# Patient Record
Sex: Male | Born: 1940 | Race: White | Hispanic: No | Marital: Married | Smoking: Former smoker
Health system: Southern US, Community
[De-identification: ages and names within clinical notes are randomized; demographics above are authoritative.]

## PROBLEM LIST (undated history)

## (undated) DIAGNOSIS — G893 Neoplasm related pain (acute) (chronic): Secondary | ICD-10-CM

## (undated) DIAGNOSIS — I700 Atherosclerosis of aorta: Secondary | ICD-10-CM

## (undated) DIAGNOSIS — G4700 Insomnia, unspecified: Secondary | ICD-10-CM

## (undated) DIAGNOSIS — G2581 Restless legs syndrome: Secondary | ICD-10-CM

## (undated) DIAGNOSIS — I722 Aneurysm of renal artery: Secondary | ICD-10-CM

## (undated) DIAGNOSIS — I10 Essential (primary) hypertension: Secondary | ICD-10-CM

## (undated) DIAGNOSIS — C801 Malignant (primary) neoplasm, unspecified: Secondary | ICD-10-CM

## (undated) DIAGNOSIS — E860 Dehydration: Principal | ICD-10-CM

## (undated) DIAGNOSIS — N529 Male erectile dysfunction, unspecified: Secondary | ICD-10-CM

## (undated) DIAGNOSIS — I2510 Atherosclerotic heart disease of native coronary artery without angina pectoris: Secondary | ICD-10-CM

## (undated) DIAGNOSIS — M1990 Unspecified osteoarthritis, unspecified site: Secondary | ICD-10-CM

## (undated) DIAGNOSIS — Z7189 Other specified counseling: Secondary | ICD-10-CM

## (undated) DIAGNOSIS — J439 Emphysema, unspecified: Secondary | ICD-10-CM

## (undated) DIAGNOSIS — Z5111 Encounter for antineoplastic chemotherapy: Secondary | ICD-10-CM

## (undated) DIAGNOSIS — R0600 Dyspnea, unspecified: Secondary | ICD-10-CM

## (undated) DIAGNOSIS — D179 Benign lipomatous neoplasm, unspecified: Secondary | ICD-10-CM

## (undated) HISTORY — PX: CATARACT EXTRACTION: SUR2

## (undated) HISTORY — DX: Male erectile dysfunction, unspecified: N52.9

## (undated) HISTORY — DX: Atherosclerosis of aorta: I70.0

## (undated) HISTORY — DX: Atherosclerotic heart disease of native coronary artery without angina pectoris: I25.10

## (undated) HISTORY — DX: Essential (primary) hypertension: I10

## (undated) HISTORY — DX: Aneurysm of renal artery: I72.2

## (undated) HISTORY — DX: Unspecified osteoarthritis, unspecified site: M19.90

## (undated) HISTORY — DX: Emphysema, unspecified: J43.9

## (undated) HISTORY — DX: Benign lipomatous neoplasm, unspecified: D17.9

## (undated) HISTORY — DX: Restless legs syndrome: G25.81

## (undated) HISTORY — PX: PILONIDAL CYST EXCISION: SHX744

## (undated) HISTORY — PX: COLONOSCOPY: SHX174

## (undated) HISTORY — DX: Dehydration: E86.0

## (undated) HISTORY — DX: Other specified counseling: Z71.89

## (undated) HISTORY — DX: Insomnia, unspecified: G47.00

## (undated) HISTORY — DX: Neoplasm related pain (acute) (chronic): G89.3

## (undated) HISTORY — DX: Encounter for antineoplastic chemotherapy: Z51.11

---

## 2008-08-21 ENCOUNTER — Encounter: Admission: RE | Admit: 2008-08-21 | Discharge: 2008-08-21 | Payer: Self-pay | Admitting: Orthopedic Surgery

## 2008-09-04 ENCOUNTER — Encounter: Admission: RE | Admit: 2008-09-04 | Discharge: 2008-09-04 | Payer: Self-pay | Admitting: Orthopedic Surgery

## 2014-05-18 ENCOUNTER — Other Ambulatory Visit: Payer: Self-pay | Admitting: Family Medicine

## 2014-05-18 DIAGNOSIS — R9389 Abnormal findings on diagnostic imaging of other specified body structures: Secondary | ICD-10-CM

## 2014-05-23 ENCOUNTER — Ambulatory Visit
Admission: RE | Admit: 2014-05-23 | Discharge: 2014-05-23 | Disposition: A | Discharge status: Home / Self Care | Payer: Medicare Other | Source: Ambulatory Visit | Attending: Family Medicine | Admitting: Family Medicine

## 2014-05-23 DIAGNOSIS — R9389 Abnormal findings on diagnostic imaging of other specified body structures: Secondary | ICD-10-CM

## 2014-05-23 MED ORDER — IOHEXOL 300 MG/ML  SOLN
75.0000 mL | Freq: Once | INTRAMUSCULAR | Status: AC | PRN
  Administered 2014-05-23: 75 mL via INTRAVENOUS

## 2014-10-31 ENCOUNTER — Other Ambulatory Visit: Payer: Self-pay | Admitting: Family Medicine

## 2014-10-31 DIAGNOSIS — R918 Other nonspecific abnormal finding of lung field: Secondary | ICD-10-CM

## 2014-11-29 ENCOUNTER — Ambulatory Visit
Admission: RE | Admit: 2014-11-29 | Discharge: 2014-11-29 | Disposition: A | Discharge status: Home / Self Care | Payer: Medicare Other | Source: Ambulatory Visit | Attending: Family Medicine | Admitting: Family Medicine

## 2014-11-29 DIAGNOSIS — I700 Atherosclerosis of aorta: Secondary | ICD-10-CM

## 2014-11-29 DIAGNOSIS — I2510 Atherosclerotic heart disease of native coronary artery without angina pectoris: Secondary | ICD-10-CM

## 2014-11-29 DIAGNOSIS — I722 Aneurysm of renal artery: Secondary | ICD-10-CM

## 2014-11-29 DIAGNOSIS — R918 Other nonspecific abnormal finding of lung field: Secondary | ICD-10-CM

## 2014-11-29 HISTORY — DX: Atherosclerotic heart disease of native coronary artery without angina pectoris: I25.10

## 2014-11-29 HISTORY — DX: Atherosclerosis of aorta: I70.0

## 2014-11-29 HISTORY — DX: Aneurysm of renal artery: I72.2

## 2014-11-29 MED ORDER — IOPAMIDOL (ISOVUE-300) INJECTION 61%
75.0000 mL | Freq: Once | INTRAVENOUS | Status: AC | PRN
Start: 2014-11-29 — End: 2014-11-29
  Administered 2014-11-29: 75 mL via INTRAVENOUS

## 2014-11-30 ENCOUNTER — Other Ambulatory Visit (HOSPITAL_COMMUNITY): Payer: Self-pay | Admitting: Family Medicine

## 2014-11-30 DIAGNOSIS — R911 Solitary pulmonary nodule: Secondary | ICD-10-CM

## 2014-12-06 ENCOUNTER — Ambulatory Visit (HOSPITAL_COMMUNITY)
Admission: RE | Admit: 2014-12-06 | Discharge: 2014-12-06 | Disposition: A | Discharge status: Home / Self Care | Payer: Medicare Other | Source: Ambulatory Visit | Attending: Family Medicine | Admitting: Family Medicine

## 2014-12-06 DIAGNOSIS — R911 Solitary pulmonary nodule: Secondary | ICD-10-CM | POA: Insufficient documentation

## 2014-12-06 DIAGNOSIS — J984 Other disorders of lung: Secondary | ICD-10-CM | POA: Diagnosis present

## 2014-12-06 DIAGNOSIS — J439 Emphysema, unspecified: Secondary | ICD-10-CM | POA: Diagnosis not present

## 2014-12-06 DIAGNOSIS — R918 Other nonspecific abnormal finding of lung field: Secondary | ICD-10-CM | POA: Diagnosis not present

## 2014-12-06 LAB — GLUCOSE, CAPILLARY: Glucose-Capillary: 108 mg/dL — ABNORMAL HIGH (ref 65–99)

## 2014-12-06 MED ORDER — FLUDEOXYGLUCOSE F - 18 (FDG) INJECTION
8.4800 | Freq: Once | INTRAVENOUS | Status: AC | PRN
  Administered 2014-12-06: 8.48 via INTRAVENOUS

## 2014-12-19 ENCOUNTER — Other Ambulatory Visit: Payer: Self-pay

## 2014-12-19 DIAGNOSIS — R918 Other nonspecific abnormal finding of lung field: Secondary | ICD-10-CM | POA: Insufficient documentation

## 2014-12-19 DIAGNOSIS — R9389 Abnormal findings on diagnostic imaging of other specified body structures: Secondary | ICD-10-CM | POA: Insufficient documentation

## 2014-12-20 ENCOUNTER — Other Ambulatory Visit: Payer: Self-pay | Admitting: *Deleted

## 2014-12-20 ENCOUNTER — Institutional Professional Consult (permissible substitution) (INDEPENDENT_AMBULATORY_CARE_PROVIDER_SITE_OTHER): Payer: Medicare Other | Admitting: Cardiothoracic Surgery

## 2014-12-20 ENCOUNTER — Encounter: Payer: Self-pay | Admitting: Cardiothoracic Surgery

## 2014-12-20 VITALS — BP 155/87 | HR 88 | Resp 16 | Ht 71.0 in | Wt 170.0 lb

## 2014-12-20 DIAGNOSIS — G2581 Restless legs syndrome: Secondary | ICD-10-CM | POA: Insufficient documentation

## 2014-12-20 DIAGNOSIS — Z125 Encounter for screening for malignant neoplasm of prostate: Secondary | ICD-10-CM | POA: Insufficient documentation

## 2014-12-20 DIAGNOSIS — M159 Polyosteoarthritis, unspecified: Secondary | ICD-10-CM | POA: Insufficient documentation

## 2014-12-20 DIAGNOSIS — N529 Male erectile dysfunction, unspecified: Secondary | ICD-10-CM | POA: Insufficient documentation

## 2014-12-20 DIAGNOSIS — R911 Solitary pulmonary nodule: Secondary | ICD-10-CM

## 2014-12-20 DIAGNOSIS — D381 Neoplasm of uncertain behavior of trachea, bronchus and lung: Secondary | ICD-10-CM | POA: Diagnosis not present

## 2014-12-20 DIAGNOSIS — D179 Benign lipomatous neoplasm, unspecified: Secondary | ICD-10-CM | POA: Insufficient documentation

## 2014-12-20 DIAGNOSIS — H6060 Unspecified chronic otitis externa, unspecified ear: Secondary | ICD-10-CM | POA: Insufficient documentation

## 2014-12-20 DIAGNOSIS — J439 Emphysema, unspecified: Secondary | ICD-10-CM | POA: Insufficient documentation

## 2014-12-20 NOTE — Progress Notes (Signed)
[address]       [place] [address]             [phone]                    Wilbern Fatima Melmore Medical Record #[id number] Date of Birth: [birth year]  Referring: [name], MD Primary Care: [name], MD  Chief Complaint:    Chief Complaint  Patient presents with  . Lung Lesion    RULobe cluster per CT CHEST 11/29/14, PET 12/06/14    History of Present Illness:    Donald Delacruz 74 y.o. male is seen in the office  today for  Right upper lung lobe nodules. Patient had routine chest xray which was abnormal  Followed by ct of chest in Nov 2015. Follow up ct done in early June 2016 and patient referred to Thoracic surgery for evaluation. Patient is previous smoker for 16 years stopped in 1999.  He has not had any occupation exposure to asbestosis, working primarily in office environment. He denies any previous cardiac history.      Current Activity/ Functional Status:  Patient is independent with mobility/ambulation, transfers, ADL's, IADL's.   Zubrod Score: At the time of surgery this patient's most appropriate activity status/level should be described as: '[x]'$     0    Normal activity, no symptoms '[]'$     1    Restricted in physical strenuous activity but ambulatory, able to do out light work '[]'$     2    Ambulatory and capable of self care, unable to do work activities, up and about               >50 % of waking hours                              '[]'$     3    Only limited self care, in bed greater than 50% of waking hours '[]'$     4    Completely disabled, no self care, confined to bed or chair '[]'$     5    Moribund   Past Medical History  Diagnosis Date  . Hypertension   . ED (erectile dysfunction)   . Insomnia   . Lipoma   . Osteoarthritis   . Emphysema lung   . Restless legs syndrome     Past Surgical History  Procedure Laterality Date  . Cataract extraction      Family History  Problem Relation Age of Onset  . Lung cancer  Mother     History   Social History  . Marital Status: Married    Spouse Name: N/A  . Number of Children: N/A  . Years of Education: N/A   Occupational History  . Office work   Social History Main Topics  . Smoking status: Former Smoker -- 1.00 packs/day for 39 years    Types: Cigarettes    Quit date: 06/30/1997  . Smokeless tobacco: Never Used     Comment: SMOKED PIPES ALSO  . Alcohol Use: Not on file  . Drug Use: Not on file  . Sexual Activity: Not on file      History  Smoking status  . Former Smoker -- 1.00 packs/day for 39 years  . Types: Cigarettes  . Quit date: 06/30/1997  Smokeless tobacco  . Never Used    Comment: SMOKED PIPES ALSO  History  Alcohol Use: Not on file     Allergies  Allergen Reactions  . Amlodipine Swelling  . Benicar [Olmesartan]     Dizziness,malaise   . Nisoldipine Swelling    Current Outpatient Prescriptions  Medication Sig Dispense Refill  . aspirin EC 81 MG tablet Take 81 mg by mouth daily.    . Calcium Carbonate-Vitamin D (CALCIUM 600+D) 600-400 MG-UNIT per tablet Take 1 tablet by mouth daily.     . clobetasol ointment (TEMOVATE) 0.05 % Clobetasol Propionate 0.05 % External Ointment Apply to rash, and rub in well, twice a day as needed.  Quantity: 1;  Refills: 2   Wilson M.D., Jama Flavors ;  Start 24-Apr-2010 Active 30 GM Tube    . diltiazem (CARDIZEM CD) 180 MG 24 hr capsule Take by mouth daily.     [name] gabapentin (NEURONTIN) 300 MG capsule Take 300-600 mg by mouth at bedtime. TO RELIEVE RESTLESS LEG SYNDROME    . lisinopril (PRINIVIL,ZESTRIL) 20 MG tablet Take by mouth daily.     . meloxicam (MOBIC) 15 MG tablet Take 15 mg by mouth daily.    . metoprolol succinate (TOPROL-XL) 100 MG 24 hr tablet Take by mouth daily.     . Multiple Vitamin (MULTIVITAMIN) capsule Take by mouth daily.     . Omega-3 Fatty Acids (FISH OIL) 1000 MG CAPS Take 1,000 mg by mouth daily.     . sildenafil (VIAGRA) 100 MG tablet Take by mouth.    .  tadalafil (CIALIS) 5 MG tablet Take by mouth.    . Turmeric 450 MG CAPS Take 500 mg by mouth daily.     . vardenafil (LEVITRA) 20 MG tablet Take by mouth.     No current facility-administered medications for this visit.      Review of Systems:     Cardiac Review of Systems: Y or N  Chest Pain [ N   ]  Resting SOB [ N  ] Exertional SOB  [N  ]  Orthopnea Aqua.Slicker  ]   Pedal Edema [  N ]    Palpitations [ N ] Syncope  Aqua.Slicker  ]   Presyncope [ N  ]  General Review of Systems: [Y] = yes [  ]=no Constitional: recent weight change [ N ];  Wt loss over the last 3 months [   ] anorexia [  ]; fatigue Aqua.Slicker  ]; nausea [  ]; night sweats [  ]; fever Aqua.Slicker  ]; or chills [ N ];          Dental: poor dentition[N  ]; Last Dentist visit:   Eye : blurred vision [ N ]; diplopia [   ]; vision changes [  ];  Amaurosis fugax[N  ]; Resp: cough [ N ];  wheezing[ N ];  hemoptysis[ N ]; shortness of breath[  N]; paroxysmal nocturnal dyspnea[ N ]; dyspnea on exertion[ N ]; or orthopnea[  ];  GI:  gallstones[  ], vomiting[  ];  dysphagia[  ]; melena[  ];  hematochezia [  ]; heartburn[  ];   Hx of  Colonoscopy[  ]; GU: kidney stones [  ]; hematuria[  ];   dysuria [  ];  nocturia[  ];  history of     obstruction [  ]; urinary frequency [  ]             Skin: rash, swelling[  ];, hair loss[  ];  peripheral edema[  ];  or itching[  ];  Musculosketetal: myalgias[  ];  joint swelling[  ];  joint erythema[  ];  joint pain[Y  ];  back pain[ N ];  Heme/Lymph: bruising[ Y ];  bleeding[  ];  anemia[  ];  Neuro: TIA[  ];  headaches[ N ];  stroke[ N ];  vertigo[ N ];  seizures[N  ];   paresthesias[ N ];  difficulty walking[N  ];  Psych:depression[  ]; anxiety[  ];  Endocrine: diabetes[  ];  thyroid dysfunction[  ];  Immunizations: Flu up to date [ Y ]; Pneumococcal up to date [ Y ];  Other:  Physical Exam: BP 155/87 mmHg  Pulse 88  Resp 16  Ht '5\' 11"'$  (1.803 m)  Wt 170 lb (77.111 kg)  BMI 23.72 kg/m2  SpO2 98%  PHYSICAL  EXAMINATION: General appearance: alert, cooperative, appears stated age and no distress Head: Normocephalic, without obvious abnormality, atraumatic Neck: no adenopathy, no carotid bruit, no JVD, supple, symmetrical, trachea midline and thyroid not enlarged, symmetric, no tenderness/mass/nodules Lymph nodes: Cervical, supraclavicular, and axillary nodes normal. Resp: clear to auscultation bilaterally Back: symmetric, no curvature. ROM normal. No CVA tenderness. Cardio: regular rate and rhythm, S1, S2 normal, no murmur, click, rub or gallop GI: soft, non-tender; bowel sounds normal; no masses,  no organomegaly Extremities: extremities normal, atraumatic, no cyanosis or edema, Homans sign is negative, no sign of DVT and no edema, redness or tenderness in the calves or thighs Neurologic: Grossly normal  Palpable dt and pt pulses bilaterial    Diagnostic Studies & Laboratory data:     Recent Radiology Findings:   Ct Chest W Contrast  11/29/2014   CLINICAL DATA:  Pulmonary nodules.  EXAM: CT CHEST WITH CONTRAST  TECHNIQUE: Multidetector CT imaging of the chest was performed during intravenous contrast administration.  CONTRAST:  60m ISOVUE-300 IOPAMIDOL (ISOVUE-300) INJECTION 61%  COMPARISON:  CT scan of May 23, 2014.  FINDINGS: No pneumothorax or pleural effusion is noted. Mild emphysematous bulla are noted throughout both lungs. Stable 9 mm subpleural nodule is noted laterally in right lower lobe. The cluster of nodules and ground-glass opacities in the right upper lobe noted on prior exam now measure 24 x 21 mm which is significantly increased in size compared to prior exam. The more inferior and anterior nodule also is significantly increased in size, measuring 14 x 9 mm currently. No mediastinal mass or adenopathy is noted. Coronary artery calcifications are noted. Atherosclerosis of thoracic aorta is noted without aneurysm or dissection. 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 No significant osseous abnormality is noted.  IMPRESSION: Stable mild emphysematous changes noted throughout both lungs.  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  Stable 9 mm subpleural nodule is noted laterally in right lower lobe.  Cluster of nodules and ground-glass opacity seen in right upper lobe on prior exam has significantly enlarged in size, and PET scan is recommended to evaluate for possible neoplasm or malignancy.  These results will be called to the ordering clinician or representative by the Radiologist Assistant, and communication documented in the PACS or zVision Dashboard.   Electronically Signed   By: JMarijo Conception M.D.   On: 11/29/2014 12:17   Nm Pet Image Initial (pi) Skull Base To Thigh  12/06/2014   CLINICAL DATA:  Initial treatment strategy for enlarging right upper lobe pulmonary lesion.  EXAM: NUCLEAR MEDICINE PET SKULL BASE TO THIGH  TECHNIQUE: 8.5 mCi F-18 FDG was injected intravenously. Full-ring  PET imaging was performed from the skull base to thigh after the radiotracer. CT data was obtained and used for attenuation correction and anatomic localization.  FASTING BLOOD GLUCOSE:  Value: 108 mg/dl  COMPARISON:  Chest CTs of 05/23/2014 and 11/29/2014  FINDINGS: NECK  No areas of abnormal hypermetabolism.  CHEST  Spiculated right apical solid and sub solid pulmonary nodule is hypermetabolic. The dominant area measures 2.8 x 2.3 cm and a S.U.V. max of 5.8 on image 15 of series 8. Contiguous or satellite nodules inferiorly and anteriorly measure up to 1.0 cm and a S.U.V. max of 3.5.  No mediastinal or hilar nodal hypermetabolism.  ABDOMEN/PELVIS  No areas of abnormal hypermetabolism.  SKELETON  No abnormal marrow activity.  CT IMAGES PERFORMED FOR ATTENUATION CORRECTION  Advanced carotid atherosclerosis bilaterally. No cervical adenopathy.  Chest findings deferred to recent diagnostic CT. Mild cardiomegaly. Multivessel  coronary artery atherosclerosis. Subpleural right lower lobe 6 mm pulmonary nodule is below the resolution of PET. Centrilobular emphysema. Bilateral low-density renal lesions which are likely cysts.  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 Normal adrenal glands. Mild prostatomegaly. Small right larger than left fat containing inguinal hernias.  IMPRESSION: 1. 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 2. No evidence of thoracic nodal or extrathoracic hypermetabolic metastasis. 3. Mild cardiomegaly. Atherosclerosis, including within the coronary arteries. 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 4. Centrilobular emphysema. A right lower lobe pulmonary nodule is below the resolution of PET, but warrants followup attention.   Electronically Signed   By: Abigail Miyamoto M.D.   On: 12/06/2014 11:59     I have independently reviewed the above radiology studies  and reviewed the findings with the patient.   Recent Lab Findings: No results found for: WBC, HGB, HCT, PLT, GLUCOSE, CHOL, TRIG, HDL, LDLDIRECT, LDLCALC, ALT, AST, NA, K, CL, CREATININE, BUN, CO2, TSH, INR, GLUF, HGBA1C    Assessment / Plan:   1)  Spiculated right apical solid and sub solid pulmonary nodule is hypermetabolic.  measures 2.8 x 2.3 cm with  Contiguous or satellite nodules inferiorly and anteriorly measure up to 1.0 cm and a S.U.V. max of 3.5 suspicious for primary lung      carcinoma - possible clinicial stage IIb t3N0 (seperate nodules in the same lobe) 2)   No mediastinal or hilar nodal hypermetabolism.  3)    Stable 9 mm subpleural nodule is noted laterally in right lower lobe. 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  Patient prefers to proceed with bx first. Will arrange CT directed needle biopsy of the right upper lobe lung nodule. Will obtain PFT to asses degree of pulmonary disease. Patient will return after needle biopsy. Will need cardiac  clearance before surgical procedure.    I  spent 45  minutes counseling the patient face to face and 50% or more the  time was spent in counseling and coordination of care. The total time spent in the appointment was 60 minutes.  [name] MD      [address] [place] [address] Office [phone]   Beeper [phone]  12/20/2014 12:08 PM

## 2014-12-25 ENCOUNTER — Ambulatory Visit (HOSPITAL_COMMUNITY)
Admission: RE | Admit: 2014-12-25 | Discharge: 2014-12-25 | Disposition: A | Discharge status: Home / Self Care | Payer: Medicare Other | Source: Ambulatory Visit | Attending: Cardiothoracic Surgery | Admitting: Cardiothoracic Surgery

## 2014-12-25 DIAGNOSIS — R911 Solitary pulmonary nodule: Secondary | ICD-10-CM | POA: Diagnosis present

## 2014-12-25 LAB — PULMONARY FUNCTION TEST
DL/VA % pred: 64 %
DL/VA: 2.9 ml/min/mmHg/L
DLCO unc % pred: 62 %
DLCO unc: 19.24 ml/min/mmHg
FEF 25-75 Post: 1.59 L/sec
FEF 25-75 Pre: 1.34 L/sec
FEF2575-%Change-Post: 18 %
FEF2575-%Pred-Post: 73 %
FEF2575-%Pred-Pre: 62 %
FEV1-%Change-Post: 4 %
FEV1-%Pred-Post: 89 %
FEV1-%Pred-Pre: 86 %
FEV1-Post: 2.66 L
FEV1-Pre: 2.55 L
FEV1FVC-%Change-Post: 0 %
FEV1FVC-%Pred-Pre: 88 %
FEV6-%Change-Post: 4 %
FEV6-%Pred-Post: 102 %
FEV6-%Pred-Pre: 97 %
FEV6-Post: 3.92 L
FEV6-Pre: 3.75 L
FEV6FVC-%Change-Post: 0 %
FEV6FVC-%Pred-Post: 100 %
FEV6FVC-%Pred-Pre: 101 %
FVC-%Change-Post: 4 %
FVC-%Pred-Post: 101 %
FVC-%Pred-Pre: 96 %
FVC-Post: 4.14 L
FVC-Pre: 3.95 L
Post FEV1/FVC ratio: 64 %
Post FEV6/FVC ratio: 95 %
Pre FEV1/FVC ratio: 64 %
Pre FEV6/FVC Ratio: 95 %
RV % pred: 128 %
RV: 3.21 L
TLC % pred: 108 %
TLC: 7.39 L

## 2014-12-25 MED ORDER — ALBUTEROL SULFATE (2.5 MG/3ML) 0.083% IN NEBU
2.5000 mg | INHALATION_SOLUTION | Freq: Once | RESPIRATORY_TRACT | Status: AC
  Administered 2014-12-25: 2.5 mg via RESPIRATORY_TRACT

## 2014-12-26 ENCOUNTER — Other Ambulatory Visit: Payer: Self-pay | Admitting: Physician Assistant

## 2014-12-27 ENCOUNTER — Other Ambulatory Visit: Payer: Self-pay | Admitting: Radiology

## 2014-12-28 ENCOUNTER — Ambulatory Visit (HOSPITAL_COMMUNITY)
Admission: RE | Admit: 2014-12-28 | Discharge: 2014-12-28 | Disposition: A | Discharge status: Home / Self Care | Payer: Medicare Other | Source: Ambulatory Visit | Attending: Radiology | Admitting: Radiology

## 2014-12-28 ENCOUNTER — Ambulatory Visit (HOSPITAL_COMMUNITY)
Admission: RE | Admit: 2014-12-28 | Discharge: 2014-12-28 | Disposition: A | Discharge status: Home / Self Care | Payer: Medicare Other | Source: Ambulatory Visit | Attending: Diagnostic Radiology | Admitting: Diagnostic Radiology

## 2014-12-28 ENCOUNTER — Other Ambulatory Visit: Payer: Self-pay | Admitting: Radiology

## 2014-12-28 ENCOUNTER — Encounter (HOSPITAL_COMMUNITY): Payer: Self-pay

## 2014-12-28 ENCOUNTER — Encounter (HOSPITAL_COMMUNITY): Payer: Medicare Other

## 2014-12-28 ENCOUNTER — Ambulatory Visit (HOSPITAL_COMMUNITY)
Admission: RE | Admit: 2014-12-28 | Discharge: 2014-12-28 | Disposition: A | Discharge status: Home / Self Care | Payer: Medicare Other | Source: Ambulatory Visit | Attending: Cardiothoracic Surgery | Admitting: Cardiothoracic Surgery

## 2014-12-28 DIAGNOSIS — R911 Solitary pulmonary nodule: Secondary | ICD-10-CM | POA: Diagnosis present

## 2014-12-28 DIAGNOSIS — J95811 Postprocedural pneumothorax: Secondary | ICD-10-CM

## 2014-12-28 DIAGNOSIS — J939 Pneumothorax, unspecified: Secondary | ICD-10-CM | POA: Insufficient documentation

## 2014-12-28 DIAGNOSIS — C3411 Malignant neoplasm of upper lobe, right bronchus or lung: Secondary | ICD-10-CM | POA: Insufficient documentation

## 2014-12-28 LAB — APTT: APTT: 25 s (ref 24–37)

## 2014-12-28 LAB — CBC
HEMATOCRIT: 39.6 % (ref 39.0–52.0)
Hemoglobin: 13.4 g/dL (ref 13.0–17.0)
MCH: 32.6 pg (ref 26.0–34.0)
MCHC: 33.8 g/dL (ref 30.0–36.0)
MCV: 96.4 fL (ref 78.0–100.0)
PLATELETS: 198 10*3/uL (ref 150–400)
RBC: 4.11 MIL/uL — ABNORMAL LOW (ref 4.22–5.81)
RDW: 12.8 % (ref 11.5–15.5)
WBC: 5.3 10*3/uL (ref 4.0–10.5)

## 2014-12-28 LAB — PROTIME-INR
INR: 0.88 (ref 0.00–1.49)
Prothrombin Time: 12.2 seconds (ref 11.6–15.2)

## 2014-12-28 MED ORDER — MORPHINE SULFATE 2 MG/ML IJ SOLN: 1.0000 mg | INTRAMUSCULAR | Status: DC | PRN

## 2014-12-28 MED ORDER — MIDAZOLAM HCL 2 MG/2ML IJ SOLN
INTRAMUSCULAR | Status: DC
Start: 2014-12-28 — End: 2014-12-29
  Filled 2014-12-28: qty 2

## 2014-12-28 MED ORDER — MIDAZOLAM HCL 2 MG/2ML IJ SOLN
INTRAMUSCULAR | Status: AC | PRN
  Administered 2014-12-28: 0.5 mg via INTRAVENOUS
  Administered 2014-12-28: 1 mg via INTRAVENOUS

## 2014-12-28 MED ORDER — SODIUM CHLORIDE 0.9 % IV SOLN
INTRAVENOUS | Status: DC
  Administered 2014-12-28: 12:00:00 via INTRAVENOUS

## 2014-12-28 MED ORDER — FENTANYL CITRATE (PF) 100 MCG/2ML IJ SOLN
INTRAMUSCULAR | Status: AC | PRN
  Administered 2014-12-28: 25 ug via INTRAVENOUS
  Administered 2014-12-28: 50 ug via INTRAVENOUS

## 2014-12-28 MED ORDER — FENTANYL CITRATE (PF) 100 MCG/2ML IJ SOLN
INTRAMUSCULAR | Status: AC
  Filled 2014-12-28: qty 2

## 2014-12-28 NOTE — Procedures (Signed)
Post-Procedure Note  Pre-operative Diagnosis: Right lung nodules       Post-operative Diagnosis: Right lung nodules   Indications: Needs tissue diagnosis  Procedure Details:   CT guided core biopsy obtained of right upper lobe lesion.  Patient had coughing immediately following the biopsy and 5-10 ml of hemoptysis.  Patient placed on right side for approximately 30 minutes and coughing stopped. O2 sats remained above 90 with nasal cannula.  Follow up CT images showed a small pneumothorax.  Attempted to aspirate the pleural air but too small to safely place catheter from previous biopsy site.  Findings: RUL nodules.  Needle positioned in largest lesion.  Single core sample obtained and small amount of parenchymal hemorrhage.  Small pneumothorax.  Complications: Small pneumothorax.  Will get follow up CXRs.  Self-limiting mild hemoptysis.      Condition: stable  Plan: Return to Short Stay after CXR.  Likely get 2nd CXR in 2 hours.

## 2014-12-28 NOTE — Progress Notes (Signed)
Patient ID: Donald Delacruz, male   DOB: 06-10-1941, 74 y.o.   MRN: [id number]    Referring Physician(s): Gerhardt,Edward B  Subjective:  Pt without new changes; has had some intermittent coughing but no further hemoptysis; denies worsening dyspnea; some mild tenderness at bx site rt lat chest  Allergies: Amlodipine; Benicar; and Nisoldipine  Medications: Prior to Admission medications   Medication Sig Start Date End Date Taking? Authorizing Provider  acetaminophen (TYLENOL) 500 MG tablet Take 1,000 mg by mouth every 4 (four) hours as needed for moderate pain or headache.    Yes Historical Provider, MD  aspirin EC 81 MG tablet Take 81 mg by mouth every morning.    Yes Historical Provider, MD  Calcium Carbonate-Vitamin D (CALCIUM 600+D) 600-400 MG-UNIT per tablet Take 1 tablet by mouth every morning.    Yes Historical Provider, MD  diltiazem (CARDIZEM CD) 180 MG 24 hr capsule Take 180 mg by mouth every morning.  11/17/12  Yes Historical Provider, MD  gabapentin (NEURONTIN) 300 MG capsule Take 300-600 mg by mouth at bedtime.  04/23/11  Yes Historical Provider, MD  lisinopril (PRINIVIL,ZESTRIL) 20 MG tablet Take 20 mg by mouth every morning.  04/23/11  Yes Historical Provider, MD  meloxicam (MOBIC) 15 MG tablet Take 15 mg by mouth every morning.    Yes Historical Provider, MD  metoprolol succinate (TOPROL-XL) 100 MG 24 hr tablet Take 100 mg by mouth every morning.  07/31/11  Yes Historical Provider, MD  Multiple Vitamin (MULTIVITAMIN) capsule Take 1 capsule by mouth every morning.    Yes Historical Provider, MD  Omega-3 Fatty Acids (FISH OIL) 1000 MG CAPS Take 1,000 mg by mouth daily.    Yes Historical Provider, MD  Turmeric 450 MG CAPS Take 500 mg by mouth every morning.    Yes Historical Provider, MD  clobetasol ointment (TEMOVATE) 0.05 % Clobetasol Propionate 0.05 % External Ointment Apply to rash, and rub in well, twice a day as needed.  Quantity: 1;  Refills: 2   Wilson M.D., [employer] Fl[employer]ors ;  Start  24-Apr-2010 Active 30 GM Tube 04/24/10   Historical Provider, MD  sildenafil (VIAGRA) 100 MG tablet Take 100 mg by mouth daily as needed for erectile dysfunction.  05/10/14   Historical Provider, MD  tadalafil (CIALIS) 5 MG tablet Take 5 mg by mouth daily as needed for erectile dysfunction.  05/10/14   Historical Provider, MD  vardenafil (LEVITRA) 20 MG tablet Take 20 mg by mouth daily as needed for erectile dysfunction.  05/10/14   Historical Provider, MD     Vital Signs: BP 145/72 mmHg  Pulse 74  Temp(Src) 98.2 F (36.8 C) (Oral)  Resp 18  SpO2 100%  Physical Exam awake/alert; puncture site rt lat chest clean and dry, soft, minimal tenderness  Imaging: Dg Chest 1 View  12/28/2014   CLINICAL DATA:  Pneumothorax after lung biopsy.  EXAM: CHEST  1 VIEW  COMPARISON:  Chest CT earlier today.  FINDINGS: Normal cardiomediastinal silhouette. Increasing RIGHT upper lobe opacity is redemonstrated post biopsy, and appears similar to the final image of where there is slight post biopsy hemorrhage.  There is a 10-15% RIGHT apical pneumothorax. No significant right-to-left mediastinal shift. This pneumothorax is moderately increased from the immediate post biopsy appearance as seen on CT image 1 series 14.  IMPRESSION: Increasing RIGHT pneumothorax estimated 10-15%. Parenchymal hemorrhage in the RIGHT upper lobe post biopsy.  Critical Value/emergent results were called by telephone at the time of interpretation on 12/28/2014 at 2:41 pm  to Rowe Robert, who verbally acknowledged these results.   Electronically Signed   By: Staci Righter M.D.   On: 12/28/2014 14:44    Labs:  CBC:  Recent Labs  12/28/14 1145  WBC 5.3  HGB 13.4  HCT 39.6  PLT 198    COAGS:  Recent Labs  12/28/14 1145  INR 0.88  APTT 25    BMP: No results for input(s): NA, K, CL, CO2, GLUCOSE, BUN, CALCIUM, CREATININE, GFRNONAA, GFRAA in the last 8760 hours.  Invalid input(s): CMP  LIVER FUNCTION TESTS: No results for  input(s): BILITOT, AST, ALT, ALKPHOS, PROT, ALBUMIN in the last 8760 hours.  Assessment and Plan:  S/p RUL lung mass bx with small ptx/alveolar hemorrhage postprocedure; currently asymptomatic with stable vital signs/O2 sats, no further hemoptysis, stable f/u CXR; pt seen by Dr. Anselm Pancoast; will ambulate pt and if stable afterwards plan  to d/c home and have pt return to [facility] in am for f/u CXR; if he becomes symptomatic with ambulation will keep overnight; pt told to come directly to ED if symptoms recur as OP.   Signed: D. Rowe Robert 12/28/2014, 4:34 PM   I spent a total of 15 minutes in face to face in clinical consultation/evaluation, greater than 50% of which was counseling/coordinating care for right lung mass biopsy with pneumothorax

## 2014-12-28 NOTE — H&P (Signed)
Chief Complaint: "I'm having a lung biopsy"  Referring Physician(s): Gerhardt,Edward B  History of Present Illness: Donald Delacruz is a 74 y.o. male , former smoker, with history of COPD , abnormal chest x-ray and recent PET scan revealing hypermetabolic right apical lung lesion concerning for carcinoma. He was recently seen by Dr. Servando Snare and is now referred to IR for CT-guided right lung mass biopsy.  Past Medical History  Diagnosis Date  . Hypertension   . ED (erectile dysfunction)   . Insomnia   . Lipoma   . Osteoarthritis   . Emphysema lung   . Restless legs syndrome     Past Surgical History  Procedure Laterality Date  . Cataract extraction      Allergies: Amlodipine; Benicar; and Nisoldipine  Medications: Prior to Admission medications   Medication Sig Start Date End Date Taking? Authorizing Provider  acetaminophen (TYLENOL) 500 MG tablet Take 1,000 mg by mouth every 4 (four) hours as needed for moderate pain or headache.    Yes Historical Provider, MD  aspirin EC 81 MG tablet Take 81 mg by mouth every morning.    Yes Historical Provider, MD  Calcium Carbonate-Vitamin D (CALCIUM 600+D) 600-400 MG-UNIT per tablet Take 1 tablet by mouth every morning.    Yes Historical Provider, MD  diltiazem (CARDIZEM CD) 180 MG 24 hr capsule Take 180 mg by mouth every morning.  11/17/12  Yes Historical Provider, MD  gabapentin (NEURONTIN) 300 MG capsule Take 300-600 mg by mouth at bedtime.  04/23/11  Yes Historical Provider, MD  lisinopril (PRINIVIL,ZESTRIL) 20 MG tablet Take 20 mg by mouth every morning.  04/23/11  Yes Historical Provider, MD  meloxicam (MOBIC) 15 MG tablet Take 15 mg by mouth every morning.    Yes Historical Provider, MD  metoprolol succinate (TOPROL-XL) 100 MG 24 hr tablet Take 100 mg by mouth every morning.  07/31/11  Yes Historical Provider, MD  Multiple Vitamin (MULTIVITAMIN) capsule Take 1 capsule by mouth every morning.    Yes Historical Provider, MD    Omega-3 Fatty Acids (FISH OIL) 1000 MG CAPS Take 1,000 mg by mouth daily.    Yes Historical Provider, MD  Turmeric 450 MG CAPS Take 500 mg by mouth every morning.    Yes Historical Provider, MD  clobetasol ointment (TEMOVATE) 0.05 % Clobetasol Propionate 0.05 % External Ointment Apply to rash, and rub in well, twice a day as needed.  Quantity: 1;  Refills: 2   Wilson M.D., [employer] ;  Start 24-Apr-2010 Active 30 GM Tube 04/24/10   Historical Provider, MD  sildenafil (VIAGRA) 100 MG tablet Take 100 mg by mouth daily as needed for erectile dysfunction.  05/10/14   Historical Provider, MD  tadalafil (CIALIS) 5 MG tablet Take 5 mg by mouth daily as needed for erectile dysfunction.  05/10/14   Historical Provider, MD  vardenafil (LEVITRA) 20 MG tablet Take 20 mg by mouth daily as needed for erectile dysfunction.  05/10/14   Historical Provider, MD     Family History  Problem Relation Age of Onset  . Lung cancer Mother     History   Social History  . Marital Status: Married    Spouse Name: N/A  . Number of Children: N/A  . Years of Education: N/A   Social History Main Topics  . Smoking status: Former Smoker -- 1.00 packs/day for 39 years    Types: Cigarettes    Quit date: 06/30/1997  . Smokeless tobacco: Never Used  Comment: SMOKED PIPES ALSO  . Alcohol Use: Not on file  . Drug Use: Not on file  . Sexual Activity: Not on file   Other Topics Concern  . None   Social History Narrative     Review of Systems  Constitutional: Negative for fever, chills and unexpected weight change.  Respiratory: Negative for cough and shortness of breath.   Cardiovascular: Negative for chest pain.  Gastrointestinal: Negative for nausea, vomiting, abdominal pain and blood in stool.  Genitourinary: Negative for dysuria and hematuria.  Musculoskeletal: Negative for back pain.  Neurological: Negative for headaches.    Vital Signs: Blood pressure 164/78, heart rate 76, respirations 18, temperature  97.9, oxygen saturation 100% room air  Physical Exam  Constitutional: He is oriented to person, place, and time. He appears well-developed and well-nourished.  Cardiovascular: Normal rate and regular rhythm.   Pulmonary/Chest: Effort normal and breath sounds normal.  Abdominal: Soft. Bowel sounds are normal. There is no tenderness.  Musculoskeletal: Normal range of motion. He exhibits no edema.  Neurological: He is alert and oriented to person, place, and time.    Mallampati Score:     Imaging: Ct Chest W Contrast  11/29/2014   CLINICAL DATA:  Pulmonary nodules.  EXAM: CT CHEST WITH CONTRAST  TECHNIQUE: Multidetector CT imaging of the chest was performed during intravenous contrast administration.  CONTRAST:  8m ISOVUE-300 IOPAMIDOL (ISOVUE-300) INJECTION 61%  COMPARISON:  CT scan of May 23, 2014.  FINDINGS: No pneumothorax or pleural effusion is noted. Mild emphysematous bulla are noted throughout both lungs. Stable 9 mm subpleural nodule is noted laterally in right lower lobe. The cluster of nodules and ground-glass opacities in the right upper lobe noted on prior exam now measure 24 x 21 mm which is significantly increased in size compared to prior exam. The more inferior and anterior nodule also is significantly increased in size, measuring 14 x 9 mm currently. No mediastinal mass or adenopathy is noted. Coronary artery calcifications are noted. Atherosclerosis of thoracic aorta is noted without aneurysm or dissection. 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 No significant osseous abnormality is noted.  IMPRESSION: Stable mild emphysematous changes noted throughout both lungs.  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  Stable 9 mm subpleural nodule is noted laterally in right lower lobe.  Cluster of nodules and ground-glass opacity seen in right upper lobe on prior exam has significantly enlarged in size,  and PET scan is recommended to evaluate for possible neoplasm or malignancy.  These results will be called to the ordering clinician or representative by the Radiologist Assistant, and communication documented in the PACS or zVision Dashboard.   Electronically Signed   By: JMarijo Conception M.D.   On: 11/29/2014 12:17   Nm Pet Image Initial (pi) Skull Base To Thigh  12/06/2014   CLINICAL DATA:  Initial treatment strategy for enlarging right upper lobe pulmonary lesion.  EXAM: NUCLEAR MEDICINE PET SKULL BASE TO THIGH  TECHNIQUE: 8.5 mCi F-18 FDG was injected intravenously. Full-ring PET imaging was performed from the skull base to thigh after the radiotracer. CT data was obtained and used for attenuation correction and anatomic localization.  FASTING BLOOD GLUCOSE:  Value: 108 mg/dl  COMPARISON:  Chest CTs of 05/23/2014 and 11/29/2014  FINDINGS: NECK  No areas of abnormal hypermetabolism.  CHEST  Spiculated right apical solid and sub solid pulmonary nodule is hypermetabolic. The dominant area measures 2.8 x 2.3 cm and a  S.U.V. max of 5.8 on image 15 of series 8. Contiguous or satellite nodules inferiorly and anteriorly measure up to 1.0 cm and a S.U.V. max of 3.5.  No mediastinal or hilar nodal hypermetabolism.  ABDOMEN/PELVIS  No areas of abnormal hypermetabolism.  SKELETON  No abnormal marrow activity.  CT IMAGES PERFORMED FOR ATTENUATION CORRECTION  Advanced carotid atherosclerosis bilaterally. No cervical adenopathy.  Chest findings deferred to recent diagnostic CT. Mild cardiomegaly. Multivessel coronary artery atherosclerosis. Subpleural right lower lobe 6 mm pulmonary nodule is below the resolution of PET. Centrilobular emphysema. Bilateral low-density renal lesions which are likely cysts.  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 Normal adrenal glands. Mild prostatomegaly. Small right larger than left fat containing inguinal hernias.   IMPRESSION: 1. 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 2. No evidence of thoracic nodal or extrathoracic hypermetabolic metastasis. 3. Mild cardiomegaly. Atherosclerosis, including within the coronary arteries. 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 4. Centrilobular emphysema. A right lower lobe pulmonary nodule is below the resolution of PET, but warrants followup attention.   Electronically Signed   By: Abigail Miyamoto M.D.   On: 12/06/2014 11:59    Labs:  CBC: No results for input(s): WBC, HGB, HCT, PLT in the last 8760 hours.  COAGS: No results for input(s): INR, APTT in the last 8760 hours.  BMP: No results for input(s): NA, K, CL, CO2, GLUCOSE, BUN, CALCIUM, CREATININE, GFRNONAA, GFRAA in the last 8760 hours.  Invalid input(s): CMP  LIVER FUNCTION TESTS: No results for input(s): BILITOT, AST, ALT, ALKPHOS, PROT, ALBUMIN in the last 8760 hours.  TUMOR MARKERS: No results for input(s): AFPTM, CEA, CA199, CHROMGRNA in the last 8760 hours.  Assessment and Plan: Strider Vallance is a 74 y.o. male , former smoker, with history of COPD , abnormal chest x-ray and recent PET scan revealing hypermetabolic right apical lung lesion concerning for carcinoma. He was recently seen by Dr. Servando Snare and is now referred to IR for CT-guided right lung mass biopsy. Risks and Benefits discussed with the patient/wife including, but not limited to bleeding, infection, damage to adjacent structures or low yield requiring additional tests, pneumothorax requiring chest tube placement and death. All of the patient's questions were answered, patient is agreeable to proceed. Consent signed and in chart.     .  Signed: D. Rowe Robert 12/28/2014, 12:08 PM   I spent a total of 20 minutes in face to face in clinical consultation, greater than 50% of which was counseling/coordinating care for CT-guided right lung mass  biopsy

## 2014-12-28 NOTE — Discharge Instructions (Signed)
Leave bandaid in place for 24 hours, shower after the 24 hours and also remove the bandaid.    Needle Biopsy of Lung, Care After Refer to this sheet in the next few weeks. These instructions provide you with information on caring for yourself after your procedure. Your health care provider may also give you more specific instructions. Your treatment has been planned according to current medical practices, but problems sometimes occur. Call your health care provider if you have any problems or questions after your procedure. WHAT TO EXPECT AFTER THE PROCEDURE  A bandage will be applied over the area where the needle was inserted. You may be asked to apply pressure to the bandage for several minutes to ensure there is minimal bleeding.  In most cases, you can leave when your needle biopsy procedure is completed. Do not drive yourself home. Someone else should take you home.  If you received an IV sedative or general anesthetic, you will be taken to a comfortable place to relax while the medicine wears off.  If you have upcoming travel scheduled, talk to your health care provider about when it is safe to travel by air after the procedure. HOME CARE INSTRUCTIONS  Expect to take it easy for the rest of the day.  Protect the area where you received the needle biopsy by keeping the bandage in place for as long as instructed.  You may feel some mild pain or discomfort in the area, but this should stop in a day or two.  Take medicines only as directed by your health care provider. SEEK MEDICAL CARE IF:   You have pain at the biopsy site that worsens or is not helped by medicine.  You have swelling or drainage at the needle biopsy site.  You have a fever. SEEK IMMEDIATE MEDICAL CARE IF:   You have new or worsening shortness of breath.  You have chest pain.  You are coughing up blood.  You have bleeding that does not stop with pressure or a bandage.  You develop light-headedness or  fainting. Document Released: 04/13/2007 Document Revised: 10/31/2013 Document Reviewed: 11/08/2012 [facility] Patient Information 2015 [place], [place]. This information is not intended to replace advice given to you by your health care provider. Make sure you discuss any questions you have with your health care provider. Lung Biopsy A lung biopsy is a procedure in which a tissue sample is removed from the lung. The tissue can be examined under a microscope to help diagnose various lung disorders.  LET [facility] CARE PROVIDER KNOW ABOUT:  Any allergies you have.  All medicines you are taking, including vitamins, herbs, eye drops, creams, and over-the-counter medicines.  Previous problems you or members of your family have had with the use of anesthetics.  Any blood disorders or bleeding problems that you have.  Previous surgeries you have had.  Medical conditions you have. RISKS AND COMPLICATIONS Generally, a lung biopsy is a safe procedure. However, problems can occur and include:  Collapse of the lung.   Bleeding.   Infection.  BEFORE THE PROCEDURE  Do not eat or drink anything after midnight on the night before the procedure or as directed by your health care provider.  Ask your health care provider about changing or stopping your regular medicines. This is especially important if you are taking diabetes medicines or blood thinners.  Plan to have someone take you home after the procedure. PROCEDURE Various methods can be used to perform a lung biopsy:   Needle  biopsy. A biopsy needle is inserted into the lung. The needle is used to collect the tissue sample. A CT scanner may be used to guide the needle to the right place in the lung. For this method, a medicine is used to numb the area where the biopsy sample will be taken (local anesthetic).  Bronchoscopy. A flexible tube (bronchoscope) is inserted into your lungs by going through your mouth or nose. A needle or forceps is  passed through the bronchoscope to remove the tissue sample. For this method, medicine may be used to numb the back of your throat.  Open biopsy. A cut (incision) is made in your chest. The tissue sample is then removed using surgical tools. The incision is closed with skin glue, skin adhesive strips, or stitches. For this method, you will be given medicine to make you sleep through the procedure (general anesthetic). AFTER THE PROCEDURE  Your recovery will be assessed and monitored.  You might have soreness and tenderness at the site of the biopsy for a few days after the procedure.  You might have a cough and some soreness in your throat for a few days if a bronchoscope was used. Document Released: 09/04/2004 Document Revised: 10/31/2013 Document Reviewed: 11/28/2012 [facility] Patient Information 2015 [place], [place]. This information is not intended to replace advice given to you by your health care provider. Make sure you discuss any questions you have with your health care provider. Conscious Sedation Sedation is the use of medicines to promote relaxation and relieve discomfort and anxiety. Conscious sedation is a type of sedation. Under conscious sedation you are less alert than normal but are still able to respond to instructions or stimulation. Conscious sedation is used during short medical and dental procedures. It is milder than deep sedation or general anesthesia and allows you to return to your regular activities sooner.  LET [facility] CARE PROVIDER KNOW ABOUT:   Any allergies you have.  All medicines you are taking, including vitamins, herbs, eye drops, creams, and over-the-counter medicines.  Use of steroids (by mouth or creams).  Previous problems you or members of your family have had with the use of anesthetics.  Any blood disorders you have.  Previous surgeries you have had.  Medical conditions you have.  Possibility of pregnancy, if this applies.  Use of  cigarettes, alcohol, or illegal drugs. RISKS AND COMPLICATIONS Generally, this is a safe procedure. However, as with any procedure, problems can occur. Possible problems include:  Oversedation.  Trouble breathing on your own. You may need to have a breathing tube until you are awake and breathing on your own.  Allergic reaction to any of the medicines used for the procedure. BEFORE THE PROCEDURE  You may have blood tests done. These tests can help show how well your kidneys and liver are working. They can also show how well your blood clots.  A physical exam will be done.  Only take medicines as directed by your health care provider. You may need to stop taking medicines (such as blood thinners, aspirin, or nonsteroidal anti-inflammatory drugs) before the procedure.   Do not eat or drink at least 6 hours before the procedure or as directed by your health care provider.  Arrange for a responsible adult, family member, or friend to take you home after the procedure. He or she should stay with you for at least 24 hours after the procedure, until the medicine has worn off. PROCEDURE   An intravenous (IV) catheter will be inserted  into one of your veins. Medicine will be able to flow directly into your body through this catheter. You may be given medicine through this tube to help prevent pain and help you relax.  The medical or dental procedure will be done. AFTER THE PROCEDURE  You will stay in a recovery area until the medicine has worn off. Your blood pressure and pulse will be checked.   Depending on the procedure you had, you may be allowed to go home when you can tolerate liquids and your pain is under control. Document Released: 03/11/2001 Document Revised: 06/21/2013 Document Reviewed: 02/21/2013 [facility] Patient Information 2015 [place], [place]. This information is not intended to replace advice given to you by your health care provider. Make sure you discuss any questions  you have with your health care provider. Conscious Sedation, Adult, Care After Refer to this sheet in the next few weeks. These instructions provide you with information on caring for yourself after your procedure. Your health care provider may also give you more specific instructions. Your treatment has been planned according to current medical practices, but problems sometimes occur. Call your health care provider if you have any problems or questions after your procedure. WHAT TO EXPECT AFTER THE PROCEDURE  After your procedure:  You may feel sleepy, clumsy, and have poor balance for several hours.  Vomiting may occur if you eat too soon after the procedure. HOME CARE INSTRUCTIONS  Do not participate in any activities where you could become injured for at least 24 hours. Do not:  Drive.  Swim.  Ride a bicycle.  Operate heavy machinery.  Cook.  Use power tools.  Climb ladders.  Work from a high place.  Do not make important decisions or sign legal documents until you are improved.  If you vomit, drink water, juice, or soup when you can drink without vomiting. Make sure you have little or no nausea before eating solid foods.  Only take over-the-counter or prescription medicines for pain, discomfort, or fever as directed by your health care provider.  Make sure you and your family fully understand everything about the medicines given to you, including what side effects may occur.  You should not drink alcohol, take sleeping pills, or take medicines that cause drowsiness for at least 24 hours.  If you smoke, do not smoke without supervision.  If you are feeling better, you may resume normal activities 24 hours after you were sedated.  Keep all appointments with your health care provider. SEEK MEDICAL CARE IF:  Your skin is pale or bluish in color.  You continue to feel nauseous or vomit.  Your pain is getting worse and is not helped by medicine.  You have bleeding  or swelling.  You are still sleepy or feeling clumsy after 24 hours. SEEK IMMEDIATE MEDICAL CARE IF:  You develop a rash.  You have difficulty breathing.  You develop any type of allergic problem.  You have a fever. MAKE SURE YOU:  Understand these instructions.  Will watch your condition.  Will get help right away if you are not doing well or get worse. Document Released: 04/06/2013 Document Reviewed: 04/06/2013 [facility] Patient Information 2015 [place], [place]. This information is not intended to replace advice given to you by your health care provider. Make sure you discuss any questions you have with your health care provider.

## 2014-12-28 NOTE — Progress Notes (Signed)
Pt states he feels good. No SOB.  No coughing noted.  Vss, afebrile, see flowsheet.  97% room air.  Explained d/c instructions to pt, gave pt IR phone # and informed pt to arrive in xray at 0830 for his follow up xray tomorrow morning.  Pt voiced understanding, pt's wife was also listening and voiced understanding.

## 2014-12-28 NOTE — Sedation Documentation (Signed)
Pt experienced episode of coughing post lung biopsy. Pt was rolled on right side and O2 increased. Pt coughed up small amounts of bright red blood. MD at bedside.

## 2014-12-28 NOTE — Progress Notes (Signed)
Pt walked up and down both hallways on [address] (short stay).  Ambulated without difficulty.  No SOB or dizziness noted.  Vss, afebrile upon returning to room, see flowchart.  Pt 98% room air.  Gave pt gingerale and told him to sit and drink and will return to see how he is feeling.  Pt voiced understanding.

## 2014-12-29 ENCOUNTER — Encounter (HOSPITAL_COMMUNITY): Payer: Medicare Other

## 2014-12-29 ENCOUNTER — Ambulatory Visit (HOSPITAL_COMMUNITY)
Admission: RE | Admit: 2014-12-29 | Discharge: 2014-12-29 | Disposition: A | Discharge status: Home / Self Care | Payer: Medicare Other | Source: Ambulatory Visit | Attending: Radiology | Admitting: Radiology

## 2014-12-29 DIAGNOSIS — J939 Pneumothorax, unspecified: Secondary | ICD-10-CM | POA: Insufficient documentation

## 2014-12-29 NOTE — Progress Notes (Signed)
Patient ID: Donald Delacruz, male   DOB: 06-May-1941, 74 y.o.   MRN: [id number] Pt returned today for f/u CXR to reassess rt apical ptx following lung mass bx 6/30. Pt asymptomatic, CXR shows stable ptx. Images were reviewed by Dr. Anselm Pancoast. Pt discharged home with instructions to call our service or come to ED with any further problems.

## 2015-01-04 ENCOUNTER — Ambulatory Visit (INDEPENDENT_AMBULATORY_CARE_PROVIDER_SITE_OTHER): Payer: Medicare Other | Admitting: Cardiothoracic Surgery

## 2015-01-04 ENCOUNTER — Encounter: Payer: Self-pay | Admitting: Cardiothoracic Surgery

## 2015-01-04 ENCOUNTER — Other Ambulatory Visit: Payer: Self-pay | Admitting: *Deleted

## 2015-01-04 ENCOUNTER — Ambulatory Visit
Admission: RE | Admit: 2015-01-04 | Discharge: 2015-01-04 | Disposition: A | Discharge status: Home / Self Care | Payer: Medicare Other | Source: Ambulatory Visit | Attending: Cardiothoracic Surgery | Admitting: Cardiothoracic Surgery

## 2015-01-04 VITALS — BP 139/79 | HR 79 | Resp 20 | Ht 71.0 in | Wt 170.0 lb

## 2015-01-04 DIAGNOSIS — D381 Neoplasm of uncertain behavior of trachea, bronchus and lung: Secondary | ICD-10-CM

## 2015-01-04 DIAGNOSIS — R918 Other nonspecific abnormal finding of lung field: Secondary | ICD-10-CM

## 2015-01-04 NOTE — Progress Notes (Signed)
[address]       [place], [address]             [id number]                    Akon Mosteller [facility] Medical Record #[id number] Date of Birth: [birth year]  Referring: [name], MD Primary Care: [name], MD  Chief Complaint:    Chief Complaint  Patient presents with  . Lung Lesion    s/p CT BX and PFT's, discuss results    History of Present Illness:    Donald Delacruz 74 y.o. male is seen in the office  today for  Right upper lung lobe nodules. Patient had routine chest xray which was abnormal  Followed by ct of chest in Nov 2015. Follow up ct done in early June 2016 and patient referred to Thoracic surgery for evaluation. Patient is previous smoker for 16 years stopped in 1999.  He has not had any occupation exposure to asbestosis, working primarily in office environment. He denies any previous cardiac history. Patient  had needle biopsy of right lung lesion   And returns today to discuss results. PFT's have been done    Current Activity/ Functional Status:  Patient is independent with mobility/ambulation, transfers, ADL's, IADL's.   Zubrod Score: At the time of surgery this patient's most appropriate activity status/level should be described as: '[x]'$     0    Normal activity, no symptoms '[]'$     1    Restricted in physical strenuous activity but ambulatory, able to do out light work '[]'$     2    Ambulatory and capable of self care, unable to do work activities, up and about               >50 % of waking hours                              '[]'$     3    Only limited self care, in bed greater than 50% of waking hours '[]'$     4    Completely disabled, no self care, confined to bed or chair '[]'$     5    Moribund   Past Medical History  Diagnosis Date  . Hypertension   . ED (erectile dysfunction)   . Insomnia   . Lipoma   . Osteoarthritis   . Emphysema lung   . Restless legs syndrome     Past Surgical History  Procedure Laterality Date  .  Cataract extraction      Family History  Problem Relation Age of Onset  . Lung cancer Mother     History   Social History  . Marital Status: Married    Spouse Name: N/A  . Number of Children: N/A  . Years of Education: N/A   Occupational History  . Office work   Social History Main Topics  . Smoking status: Former Smoker -- 1.00 packs/day for 39 years    Types: Cigarettes    Quit date: 06/30/1997  . Smokeless tobacco: Never Used     Comment: SMOKED PIPES ALSO  . Alcohol Use: Not on file  . Drug Use: Not on file  . Sexual Activity: Not on file      History  Smoking status  . Former Smoker -- 1.00 packs/day for 39 years  . Types: Cigarettes  . Quit  date: 06/30/1997  Smokeless tobacco  . Never Used    Comment: SMOKED PIPES ALSO    History  Alcohol Use: Not on file     Allergies  Allergen Reactions  . Amlodipine Swelling  . Benicar [Olmesartan]     Dizziness,malaise   . Nisoldipine Swelling    Current Outpatient Prescriptions  Medication Sig Dispense Refill  . acetaminophen (TYLENOL) 500 MG tablet Take 1,000 mg by mouth every 4 (four) hours as needed for moderate pain or headache.     [name] aspirin EC 81 MG tablet Take 81 mg by mouth every morning.     . Calcium Carbonate-Vitamin D (CALCIUM 600+D) 600-400 MG-UNIT per tablet Take 1 tablet by mouth every morning.     . clobetasol ointment (TEMOVATE) 0.05 % Clobetasol Propionate 0.05 % External Ointment Apply to rash, and rub in well, twice a day as needed.  Quantity: 1;  Refills: 2   Wilson M.D., [employer] ;  Start 24-Apr-2010 Active 30 GM Tube    . diltiazem (CARDIZEM CD) 180 MG 24 hr capsule Take 180 mg by mouth every morning.     . gabapentin (NEURONTIN) 300 MG capsule Take 300-600 mg by mouth at bedtime.     [name] lisinopril (PRINIVIL,ZESTRIL) 20 MG tablet Take 20 mg by mouth every morning.     . metoprolol succinate (TOPROL-XL) 100 MG 24 hr tablet Take 100 mg by mouth every morning.     . Multiple Vitamin  (MULTIVITAMIN) capsule Take 1 capsule by mouth every morning.     . sildenafil (VIAGRA) 100 MG tablet Take 100 mg by mouth daily as needed for erectile dysfunction.     . tadalafil (CIALIS) 5 MG tablet Take 5 mg by mouth daily as needed for erectile dysfunction.     . Turmeric 450 MG CAPS Take 500 mg by mouth every morning.     . vardenafil (LEVITRA) 20 MG tablet Take 20 mg by mouth daily as needed for erectile dysfunction.      No current facility-administered medications for this visit.      Review of Systems:     Cardiac Review of Systems: Y or N  Chest Pain [ N   ]  Resting SOB [ N  ] Exertional SOB  [N  ]  Orthopnea Aqua.Slicker  ]   Pedal Edema [  N ]    Palpitations [ N ] Syncope  Aqua.Slicker  ]   Presyncope [ N  ]  General Review of Systems: [Y] = yes [  ]=no Constitional: recent weight change [ N ];  Wt loss over the last 3 months [   ] anorexia [  ]; fatigue Aqua.Slicker  ]; nausea [  ]; night sweats [  ]; fever Aqua.Slicker  ]; or chills [ N ];          Dental: poor dentition[N  ]; Last Dentist visit:   Eye : blurred vision [ N ]; diplopia [   ]; vision changes [  ];  Amaurosis fugax[N  ]; Resp: cough [ N ];  wheezing[ N ];  hemoptysis[ N ]; shortness of breath[  N]; paroxysmal nocturnal dyspnea[ N ]; dyspnea on exertion[ N ]; or orthopnea[  ];  GI:  gallstones[  ], vomiting[  ];  dysphagia[  ]; melena[  ];  hematochezia [  ]; heartburn[  ];   Hx of  Colonoscopy[  ]; GU: kidney stones [  ]; hematuria[  ];   dysuria [  ];  nocturia[  ];  history of     obstruction [  ]; urinary frequency [  ]             Skin: rash, swelling[  ];, hair loss[  ];  peripheral edema[  ];  or itching[  ]; Musculosketetal: myalgias[  ];  joint swelling[  ];  joint erythema[  ];  joint pain[Y  ];  back pain[ N ];  Heme/Lymph: bruising[ Y ];  bleeding[  ];  anemia[  ];  Neuro: TIA[  ];  headaches[ N ];  stroke[ N ];  vertigo[ N ];  seizures[N  ];   paresthesias[ N ];  difficulty walking[N  ];  Psych:depression[  ]; anxiety[  ];  Endocrine:  diabetes[  ];  thyroid dysfunction[  ];  Immunizations: Flu up to date [ Y ]; Pneumococcal up to date [ Y ];  Other:  Physical Exam: BP 139/79 mmHg  Pulse 79  Resp 20  Ht '5\' 11"'$  (1.803 m)  Wt 170 lb (77.111 kg)  BMI 23.72 kg/m2  SpO2 97%  PHYSICAL EXAMINATION: General appearance: alert, cooperative, appears stated age and no distress Head: Normocephalic, without obvious abnormality, atraumatic Neck: no adenopathy, no carotid bruit, no JVD, supple, symmetrical, trachea midline and thyroid not enlarged, symmetric, no tenderness/mass/nodules Lymph nodes: Cervical, supraclavicular, and axillary nodes normal. Resp: clear to auscultation bilaterally Back: symmetric, no curvature. ROM normal. No CVA tenderness. Cardio: regular rate and rhythm, S1, S2 normal, no murmur, click, rub or gallop GI: soft, non-tender; bowel sounds normal; no masses,  no organomegaly Extremities: extremities normal, atraumatic, no cyanosis or edema, Homans sign is negative, no sign of DVT and no edema, redness or tenderness in the calves or thighs Neurologic: Grossly normal  Palpable dt and pt pulses bilaterial    Diagnostic Studies & Laboratory data:     Recent Radiology Findings:   Ct Chest W Contrast  11/29/2014   CLINICAL DATA:  Pulmonary nodules.  EXAM: CT CHEST WITH CONTRAST  TECHNIQUE: Multidetector CT imaging of the chest was performed during intravenous contrast administration.  CONTRAST:  27m ISOVUE-300 IOPAMIDOL (ISOVUE-300) INJECTION 61%  COMPARISON:  CT scan of May 23, 2014.  FINDINGS: No pneumothorax or pleural effusion is noted. Mild emphysematous bulla are noted throughout both lungs. Stable 9 mm subpleural nodule is noted laterally in right lower lobe. The cluster of nodules and ground-glass opacities in the right upper lobe noted on prior exam now measure 24 x 21 mm which is significantly increased in size compared to prior exam. The more inferior and anterior nodule also is significantly  increased in size, measuring 14 x 9 mm currently. No mediastinal mass or adenopathy is noted. Coronary artery calcifications are noted. Atherosclerosis of thoracic aorta is noted without aneurysm or dissection. 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 No significant osseous abnormality is noted.  IMPRESSION: Stable mild emphysematous changes noted throughout both lungs.  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  Stable 9 mm subpleural nodule is noted laterally in right lower lobe.  Cluster of nodules and ground-glass opacity seen in right upper lobe on prior exam has significantly enlarged in size, and PET scan is recommended to evaluate for possible neoplasm or malignancy.  These results will be called to the ordering clinician or representative by the Radiologist Assistant, and communication documented in the PACS or zVision Dashboard.   Electronically Signed   By: JMarijo Conception M.D.  On: 11/29/2014 12:17   Nm Pet Image Initial (pi) Skull Base To Thigh  12/06/2014   CLINICAL DATA:  Initial treatment strategy for enlarging right upper lobe pulmonary lesion.  EXAM: NUCLEAR MEDICINE PET SKULL BASE TO THIGH  TECHNIQUE: 8.5 mCi F-18 FDG was injected intravenously. Full-ring PET imaging was performed from the skull base to thigh after the radiotracer. CT data was obtained and used for attenuation correction and anatomic localization.  FASTING BLOOD GLUCOSE:  Value: 108 mg/dl  COMPARISON:  Chest CTs of 05/23/2014 and 11/29/2014  FINDINGS: NECK  No areas of abnormal hypermetabolism.  CHEST  Spiculated right apical solid and sub solid pulmonary nodule is hypermetabolic. The dominant area measures 2.8 x 2.3 cm and a S.U.V. max of 5.8 on image 15 of series 8. Contiguous or satellite nodules inferiorly and anteriorly measure up to 1.0 cm and a S.U.V. max of 3.5.  No mediastinal or hilar nodal hypermetabolism.  ABDOMEN/PELVIS  No areas  of abnormal hypermetabolism.  SKELETON  No abnormal marrow activity.  CT IMAGES PERFORMED FOR ATTENUATION CORRECTION  Advanced carotid atherosclerosis bilaterally. No cervical adenopathy.  Chest findings deferred to recent diagnostic CT. Mild cardiomegaly. Multivessel coronary artery atherosclerosis. Subpleural right lower lobe 6 mm pulmonary nodule is below the resolution of PET. Centrilobular emphysema. Bilateral low-density renal lesions which are likely cysts.  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 Normal adrenal glands. Mild prostatomegaly. Small right larger than left fat containing inguinal hernias.  IMPRESSION: 1. 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 2. No evidence of thoracic nodal or extrathoracic hypermetabolic metastasis. 3. Mild cardiomegaly. Atherosclerosis, including within the coronary arteries. 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 4. Centrilobular emphysema. A right lower lobe pulmonary nodule is below the resolution of PET, but warrants followup attention.   Electronically Signed   By: Abigail Miyamoto M.D.   On: 12/06/2014 11:59     I have independently reviewed the above radiology studies  and reviewed the findings with the patient.  Pulmonary Functions Testing Results:  TLC  Date Value Ref Range Status  12/25/2014 7.39 L Final  fev1 2.55 86 %  DLCO 19.24 62%  Recent Lab Findings: Lab Results  Component Value Date   WBC 5.3 12/28/2014   HGB 13.4 12/28/2014   HCT 39.6 12/28/2014   PLT 198 12/28/2014   INR 0.88 12/28/2014      Assessment / Plan:   1)  Spiculated right apical solid and sub solid pulmonary nodule is hypermetabolic.  measures 2.8 x 2.3 cm with  Contiguous or satellite nodules inferiorly and anteriorly measure up to 1.0 cm and a S.U.V. max of 3.5 suspicious for primary lung      carcinoma - clinicial stage  IIb t3N0 (seperate nodules in the same lobe) path confirms  ADENOCARCINOMA I have recommended to the patient to proceed with Right VATS with lung resection prob right upper lobectomy and node dissection. Risks and options discussed with he patient including radiation therapy. Lung resection offers best chance of cure.  Patient will call back in next several days to schedule, He notes he needs time to make arrangements for the care of his wife while he is in hospital.      2)   No mediastinal or hilar nodal hypermetabolism.  3)    Stable 9 mm subpleural nodule is noted laterally in right lower lobe. 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

## 2015-01-04 NOTE — Patient Instructions (Signed)
Lung Cancer  Lung cancer is an abnormal growth of cells in one or both of your lungs. These extra cells may form a mass of tissue called a growth or tumor. Tumors can be either cancerous (malignant) or not cancerous (benign).   Lung cancer is the most common cause of cancer death in men and women. There are several different types of lung cancers. Usually, lung cancer is described as either small cell lung cancer or nonsmall cell lung cancer. Other types of cancer occur in the lungs, including carcinoid and cancers spread from other organs. The types of cancer have different behavior and treatment.  RISK FACTORS  Smoking is the most common risk factor for developing lung cancer. Other risk factors include:  · Radon gas exposure.  · Asbestos and other industrial substance exposure.  · Second hand tobacco smoke.  · Air pollution.  · Family or personal history of lung cancer.  · Age older than 65 years.  CAUSES   Lung cancer usually starts when the lungs are exposed to harmful chemicals. Smoking is the most common risk factor for lung cancer. When you quit smoking, your risk of lung cancer falls each year (but is never the same as a person who has never smoked).   SYMPTOMS   Lung cancer may not have any symptoms in its early stages. The symptoms can depend on the type of cancer, its location, and other factors. Symptoms can include:  · Cough (either new, different, or more severe).  · Shortness of breath.  · Coughing up blood (hemoptysis).  · Chest pain.  · Hoarseness.  · Swelling of the face.  · Drooping eyelid.  · Changes in blood tests, such as low sodium (hyponatremia), high calcium (hypercalcemia), or low blood count (anemia).  · Weight loss.  DIAGNOSIS   Your health care provider may suspect lung cancer based on your symptoms or based on tests obtained for other reasons. Tests or procedures used to find or confirm the presence of lung cancer may include:  · Chest X-ray.  · CT scan of the lungs and chest.  · Blood  tests.  · Taking a tissue sample (biopsy) from your lung to look for cancer cells.  Your cancer will be staged to determine its severity and extent. Staging is a careful attempt to find out the size of the tumor, whether the cancer has spread, and if so, to what parts of the body. You may need to have more tests to determine the stage of your cancer. The test results will help determine what treatment plan is best for you.   · Stage 0--This is the earliest stage of lung cancer. In this stage the tumor is present in only a few layers of cells and has not grown beyond the inner lining of the lungs. Stage 0 (carcinoma in situ) is considered noninvasive, meaning at this stage it is not yet capable of spreading to other regions.  · Stage I-- The cancer is located only in the lungs and not spread to any lymph nodes.  · Stage II--The cancer is in the lungs and the nearby lymph nodes.  · Stage III--The cancer is in the lungs and the lymph nodes in the middle of the chest. This is also called locally advanced disease. This stage has two subtypes:  ¨ Stage IIIa - The cancer has spread only to lymph nodes on the same side of the chest where the cancer started.  ¨ Stage IIIb - The cancer   stage describes when the cancer has spread to both lungs, the fluid in the area around the lungs, or to another body part. Your health care provider may tell you the detailed stage of your cancer, which includes both a number and a letter.  TREATMENT  Depending on the type and stage, lung cancer may be treated with surgery, radiation therapy, chemotherapy, or targeted therapy. Some people have a combination of these therapies. Your treatment plan will be developed by your health care team.  [place] not smoke.  Only take  over-the-counter or prescription medicines for pain, discomfort, or fever as directed by your health care provider.  Maintain a healthy diet.  Consider joining a support group. This may help you learn to cope with the stress of having lung cancer.  Seek advice to help you manage treatment side effects.  Keep all follow-up appointments as directed by your health care provider.  Inform your cancer specialist if you are admitted to the hospital. [place] IF:   You are losing weight without trying.  You have a persistent cough.  You feel short of breath.  You tire easily. SEEK IMMEDIATE MEDICAL CARE IF:   You cough up clotted blood or bright red blood.  Your pain is not manageable or controlled by medicine.  You develop new difficulty breathing or chest pain.  You develop swelling in one or both ankles or legs, or swelling in your face or neck.  You develop headache or confusion. Document Released: 09/22/2000 Document Revised: 04/06/2013 Document Reviewed: 10/20/2013 [facility] Patient Information 2015 [place], [place]. This information is not intended to replace advice given to you by your health care provider. Make sure you discuss any questions you have with your health care provider.  Video-Assisted Thoracic Surgery Video-assisted thoracic surgery (VATS) is a procedure that allows your caregiver to look inside your chest and do some minor operations. VATS is commonly done to:  Study or diagnose problems in the chest.  Take a tissue sample (biopsy).   Put medications directly into the chest.   Remove collections of fluid, pus, or blood.  Remove tumors. VATS is done using thoracoscopy. Thoracoscopy is a procedure in which a thin, lighted tube (thoracoscope) is put through a small surgical cut (incision)inyour chest wall. The thoracoscope used for VATS has a video camera on it. It allows your caregiver to see the inside of your chest on a television screen. LET  YOUR CAREGIVER KNOW ABOUT:   Any allergies you have.  All medications you are taking, including vitamins, herbs, eyedrops, and over-the-counter medications and creams.  Use of steroids (by mouth or creams).   Previous problems you or members of your family have had with the use of anesthetics.  Any blood disorders you have had.  Any blood thinners, like warfarin, that you take.  Previous surgery.   Any history of heart problems.  Other health problems you RISKS AND COMPLICATIONS  Severe bleeding (hemorrhage).  Injury to nerves or other structures in the chest.   Lung infection.  The chest may need to be opened with a large incision (thoracotomy) if the procedure cannot be completed safely with the thoracoscope. BEFORE THE PROCEDURE   Tests such as blood tests, urine tests, chest X-rays, and electrocardiography may be done.  Follow your caregiver's instructions if you are taking dietary supplements or medications. Your caregiver may tell you to stop taking them or to reduce your dosage.  Do not take new dietary supplements  or medications within 1 week of your procedure unless your caregiver approves them.  Do not eat or drink within 8 hours of your procedure or as directed by your caregiver.  Do not smoke for as long as possible before your procedure. If possible, stop smoking 3-6 weeks before the procedure. PROCEDURE   You will be given a medication that makes you sleep (general anesthetic) through a mask, through an intravenous (IV) access tube, or through both. This medication will prevent you from being alert and feeling pain during the procedure.Once you are asleep, abreathing tube or mask may be used to help you breathe.  A video thoracoscope will be placed in a very small incision (less than an inch wide), so that your caregiver can see into your chest. The picture from inside the chest will be displayed on a television screen.  Other surgical tools will be  inserted through the remaining incisions.  One of your lungs will be deflated as the surgery is performed. Deflating a lung makes it easier for your caregiver to see the area. The lung will be inflated at the end of the procedure.  The tools will be removed when your caregiver has finished performing the examination or procedure.  A chest tube will be inserted through an incision to drain the wound. The remaining incisions will be closed with stitches or staples. AFTER THE PROCEDURE  Chest tubes are watched closely for signs of fluid or air buildup in the lungs. Your caregiver will most likely remove them prior to discharge.  You will need to stay in the hospital for 1-5 days. The average time in the hospital with VATS surgery is usually several days less than with standard open surgery.  You may receive medications to help with pain.  It is normal to feel sore for about 2 weeks after the procedure. Document Released: 10/11/2012 Document Reviewed: 10/11/2012 [facility] Patient Information 2015 [place]. This information is not intended to replace advice given to you by your health care provider. Make sure you discuss any questions you have with your health care provider. Lung Resection A lung resection is a procedure to remove part or all of a lung. When an entire lung is removed, the procedure is called a pneumonectomy. When only part of a lung is removed, the procedure is called a lobectomy. A lung resection is typically done to get rid of a tumor or cancer, but it may be done to treat other conditions. This procedure can help relieve some or all of your symptoms and can also help keep the problem from getting worse. Lung resection may provide the best chance for curing your disease. However, the procedure may not necessarily cure lung cancer if that is the problem.  LET [facility] CARE PROVIDER KNOW ABOUT:   Any allergies you have.  All medicines you are taking, including vitamins,  herbs, eye drops, creams, and over-the-counter medicines.  Previous problems you or members of your family have had with the use of anesthetics.  Any blood disorders you have.  Previous surgeries you have had.  Medical conditions you have. RISKS AND COMPLICATIONS  Generally, lung resection is a safe procedure. However, problems can occur and include:  Excessive bleeding.  Infection.  Inability to breathe without a ventilator.  Persistent shortness of breath.  Heart problems, including abnormal rhythms and a risk of heart attack or heart failure.  Blood clots.  Injury to a blood vessel.  Injury to a nerve.  Failure to heal  properly.  Stroke.  Bronchopleural fistula. This is a small hole between one of the main breathing tubes (bronchus) and the lining of the lungs. This is rare.  Reaction to anesthesia. BEFORE THE PROCEDURE  You may have tests done before the procedure, including:  Blood tests.  Urine tests.  X-rays.  Other imaging tests (such as CT scans, MRI scans, and PET scans). These tests are done to find the exact size and location of the condition being treated with this surgery.  Pulmonary function tests. These are breathing tests to assess the function of your lungs before surgery and to decide how to best help your breathing after surgery.  Heart testing. This is done to make sure your heart is strong enough for the procedure.  Bronchoscopy. This is a technique that allows your health care provider to look at the inside of your airways. This is done using a soft, flexible tube (bronchoscope). Along with imaging tests, this can help your health care provider know the exact location and size of the area that will be removed during surgery.  Lymph node sampling. This may need to be done to see if the tumor has spread. It may be done as a separate surgery or right before your lung resection procedure. PROCEDURE  An IV tube will be placed in your arm. You  will be given a medicine that makes you fall asleep (general anesthetic). You may also get pain medicine through a thin, flexible tube (catheter) in your back.  A breathing tube will be placed in your throat.  Once the surgical team has prepared you for surgery, your surgeon will make an incision on your side. Some resections are done through large incisions, while others can be done through small incisions using smaller instruments and assisted with small cameras (laparoscopic surgery).  Your surgeon will carefully cut the veins, arteries, and bronchus leading to your lung. After being cut, each of these pieces will be sewn or stapled closed. The lung or part of the lung will then be removed.  Your surgeon will check inside your chest to make sure there is no bleeding in or around the lungs. Lymph nodes near the lung may also be removed for later tests.  Your surgeon may put tubes into your chest to drain extra fluid and air after surgery.  Your incision will be closed. This may be done using:  Stitches that absorb into your body and do not need to be removed.  Stitches that must be removed.  Staples that must be removed. AFTER THE PROCEDURE   You will be taken to the recovery area and your progress will be monitored. You may still have a breathing tube and other tubes or catheters in your body immediately after surgery. These will be removed during your recovery. You may be put on a respirator following surgery if some assistance is needed to help your breathing. When you are awake and not experiencing immediate problems from surgery, you will be moved to the intensive care unit (ICU) where you will continue your recovery.  You may feel pain in your chest and throat. Sometimes during recovery, patients may shiver or feel nauseous. You will be given medicine to help with pain and nausea.  The breathing tube will be taken out as soon as your health care providers feel you can breathe on your  own. For most people, this happens on the same day as the surgery.  If your surgery and time in the ICU go  well, most of the tubes and equipment will be taken out within 1-2 days after surgery. This is about how long most people stay in the ICU. You may need to stay longer, depending on how you are doing.  You should also start respiratory therapy in the ICU. This therapy uses breathing exercises to help your other lung stay healthy and get stronger.  As you improve, you will be moved to a regular hospital room for continued respiratory therapy, help with your bladder and bowels, and to continue medicines.  After your lung or part of your lung is taken out, there will be a space inside your chest. This space will often fill up with fluid over time. The amount of time this takes is different for each person.  You will receive care until you are doing well and your health care provider feels it is safe for you to go home or to transfer to an extended care facility. Document Released: 09/06/2002 Document Revised: 10/31/2013 Document Reviewed: 08/05/2013 [facility] Patient Information 2015 [place], [place]. This information is not intended to replace advice given to you by your health care provider. Make sure you discuss any questions you have with your health care provider. Lung Resection, Care After Refer to this sheet in the next few weeks. These instructions provide you with information on caring for yourself after your procedure. Your health care provider may also give you more specific instructions. Your treatment has been planned according to current medical practices, but problems sometimes occur. Call your health care provider if you have any problems or questions after your procedure. WHAT TO EXPECT AFTER THE PROCEDURE After your procedure, it is typical to have the following:  You may feel pain in your chest and throat. Patients may sometimes shiver or feel nauseous during recovery. HOME CARE  INSTRUCTIONS You may resume a normal diet and activities as directed by your health care provider. Do not use any tobacco products, including cigarettes, chewing tobacco, or electronic cigarettes. If you need help quitting, ask your health care provider. There are many different ways to close and cover an incision, including stitches, skin glue, and adhesive strips. Follow your health care provider's instructions on: Incision care. Bandage (dressing) changes and removal. Incision closure removal. Take medicines only as directed by your health care provider. Keep all follow-up visits as directed by your health care provider. This is important. Try to breathe deeply and cough as directed. Holding a pillow firmly over your ribs may help with discomfort. If you were given an incentive spirometer in the hospital, continue to use it as directed by your health care provider. Walk as directed by your health care provider. You may take a shower and gently wash the area of your incision with water and soap as directed by your health care provider. Do not use anything else to clean your incision except as directed by your health care provider. Do not take baths, swim, or use a hot tub until your health care provider approves. SEEK MEDICAL CARE IF: You notice redness, swelling, or increasing pain at the incision site. You are bleeding at the incision site. You see pus coming from the incision site. You notice a bad smell coming from the incision site or bandage. Your incision breaks open. You cough up blood or pus, or you develop a cough that produces bad-smelling sputum. You have pain or swelling in your legs. You have increasing pain that is not controlled with medicine. You have trouble managing any of  the tubes that have been left in place after surgery. You have fever or chills. SEEK IMMEDIATE MEDICAL CARE IF:  You have chest pain or an irregular or rapid heartbeat. You have dizzy episodes or  faint. You have shortness of breath or difficulty breathing. You have persistent nausea or vomiting. You have a rash. Document Released: 01/03/2005 Document Revised: 10/31/2013 Document Reviewed: 08/05/2013 [facility] Patient Information 2015 [place], [place]. This information is not intended to replace advice given to you by your health care provider. Make sure you discuss any questions you have with your health care provider.

## 2015-01-08 ENCOUNTER — Other Ambulatory Visit: Payer: Self-pay | Admitting: *Deleted

## 2015-01-08 DIAGNOSIS — J984 Other disorders of lung: Secondary | ICD-10-CM

## 2015-01-10 ENCOUNTER — Ambulatory Visit (INDEPENDENT_AMBULATORY_CARE_PROVIDER_SITE_OTHER): Payer: Medicare Other | Admitting: Cardiology

## 2015-01-10 ENCOUNTER — Encounter: Payer: Self-pay | Admitting: Cardiology

## 2015-01-10 VITALS — BP 132/72 | HR 92 | Ht 70.0 in | Wt 172.0 lb

## 2015-01-10 DIAGNOSIS — I2510 Atherosclerotic heart disease of native coronary artery without angina pectoris: Secondary | ICD-10-CM | POA: Diagnosis not present

## 2015-01-10 DIAGNOSIS — J439 Emphysema, unspecified: Secondary | ICD-10-CM

## 2015-01-10 DIAGNOSIS — Z01818 Encounter for other preprocedural examination: Secondary | ICD-10-CM | POA: Diagnosis not present

## 2015-01-10 DIAGNOSIS — I10 Essential (primary) hypertension: Secondary | ICD-10-CM | POA: Diagnosis not present

## 2015-01-10 NOTE — Progress Notes (Signed)
PATIENT: Donald Delacruz MRN: [id number] DOB: [birth year] PCP: Donald Seller, MD  Clinic Note: Chief Complaint  Patient presents with  . Pre-op Exam    Surgical Clearance; no chest pain, no shortness of breath, occassional edema, no pain in legs, occassional at night cramping in legs, no lightheadedness, no dizziness    HPI: Donald Delacruz is a 74 y.o. male with a PMH below who presents today for preoperative cardiology evaluation for upcoming lung surgery.  He was seen by Dr. Lanelle Delacruz on July 7 for following up with a CT scan performed in November 2015 showing multiple lung nodules. He is a former smoker who quit in 1999. CT scan showed a spiculated solid sub-solid right apical nodule that was hypermetabolic. (2.8 x 2.3 cm) - ADENOCARCINOMA clinical stage IIb t3N0. Dr. Roxy Delacruz recommended Right VATS guided lung resection with probable right upper lobectomy and node dissection. He was referred for cardiac clearance due to his coronary calcifications without symptoms.  Interval History: Donald Delacruz is a relatively active and otherwise relatively healthy appearing 74 year old gentleman who does not complain of any significant symptoms of chest discomfort with rest or exertion. He does have some exertional dyspnea, but does not state that this is any worsening usually has been. Unfortunately over the last several months he has been unable to do the level of activity the usually would do because of his multiple doctor appointments. Prior to having these appointments, he would doubt roughly 5 days a week for about half an hour time using the elliptical machine. He also does multiple chores around the house.  The remainder of cardiac review of systems is as follows: Cardiovascular ROS: positive for - dyspnea on exertion negative for - chest pain, edema, irregular heartbeat, loss of consciousness, murmur, orthopnea, palpitations, paroxysmal nocturnal dyspnea, rapid heart rate or Near-syncope,  TIA/amaurosis fugax. :   Past Medical History  Diagnosis Date  . Essential hypertension   . ED (erectile dysfunction)   . Insomnia   . Lipoma   . Osteoarthritis   . Emphysema lung   . Restless legs syndrome   . Coronary artery calcification seen on computed tomography June 2016    CT scan of chest: Coronary artery calcifications are noted  . Thoracic aortic atherosclerosis June 2016    CT scan chest: Atherosclerosis of thoracic aorta is noted without aneurysm or dissection. Visualized portion of upper abdomen demonstrates stable calcified splenic artery and right renal  . 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 Cataract extraction      Allergies  Allergen Reactions  . Amlodipine Swelling  . Benicar [Olmesartan]     Dizziness,malaise   . Nisoldipine Swelling    Current Outpatient Prescriptions  Medication Sig Dispense Refill  . acetaminophen (TYLENOL) 500 MG tablet Take 1,000 mg by mouth every 4 (four) hours as needed for moderate pain or headache.     [name] aspirin EC 81 MG tablet Take 81 mg by mouth every morning.     . Calcium Carbonate-Vitamin D (CALCIUM 600+D) 600-400 MG-UNIT per tablet Take 1 tablet by mouth every morning.     . clobetasol ointment (TEMOVATE) 0.05 % Clobetasol Propionate 0.05 % External Ointment Apply to rash, and rub in well, twice a day as needed.  Quantity: 1;  Refills: 2   Donald M.D., Donald Delacruz ;  Start 24-Apr-2010 Active 30 GM Tube    . diltiazem (CARDIZEM CD) 180 MG 24 hr capsule Take 180 mg by mouth every morning.     . gabapentin (NEURONTIN) 300 MG capsule Take 300-600 mg by mouth at bedtime.     [name] lisinopril (PRINIVIL,ZESTRIL) 20 MG tablet Take 20 mg by mouth every morning.     . metoprolol succinate (TOPROL-XL) 100 MG 24 hr tablet Take 100 mg by mouth every morning.     . Multiple  Vitamin (MULTIVITAMIN) capsule Take 1 capsule by mouth every morning.     . sildenafil (VIAGRA) 100 MG tablet Take 100 mg by mouth daily as needed for erectile dysfunction.     . Turmeric 450 MG CAPS Take 500 mg by mouth every morning.     . vardenafil (LEVITRA) 20 MG tablet Take 20 mg by mouth daily as needed for erectile dysfunction.      No current facility-administered medications for this visit.   History  Substance Use Topics  . Smoking status: Former Smoker -- 1.00 packs/day for 39 years    Types: Cigarettes    Quit date: 06/30/1997  . Smokeless tobacco: Never Used     Comment: SMOKED PIPES ALSO  . Alcohol Use: Not on file    family history includes Lung cancer in his mother.  ROS: A comprehensive Review of Systems - Negative except Exertional dyspnea and symptoms noted below Review of Systems  Constitutional: Negative for fever, chills and weight loss.  Eyes: Negative for blurred vision.  Respiratory: Positive for cough (Rare). Negative for sputum production.   Gastrointestinal: Negative for blood in stool and melena.  Genitourinary: Negative for hematuria.  Musculoskeletal: Positive for joint pain (he has pretty significant bilateral knee arthritis pains. As part of his preoperative evaluation he was told not to take his arthritis pain medications. He is not sure if he would be able to walk on treadmill.).  Neurological: Negative for dizziness.  All other systems reviewed and are negative.  PHYSICAL EXAM BP 132/72 mmHg  Pulse 92  Ht '5\' 10"'$  (1.778 m)  Wt 78.019 kg (172 lb)  BMI 24.68 kg/m2 General appearance: alert, cooperative, appears stated age, no distress and Healthy-appearing. Pleasant mood and affect. Answers questions appropriate. HEENT: [place]/AT, EOMI, MMM, anicteric sclera Neck: no adenopathy, no carotid bruit, no JVD, supple, symmetrical, trachea midline and thyroid not enlarged, symmetric, no tenderness/mass/nodules Lungs: clear to auscultation bilaterally,  normal percussion bilaterally and Nonlabored with good air movement. No tubular breath sounds or significant adnexal sounds Heart: regular rate and rhythm, S1, S2 normal, no murmur, click, rub or gallop and normal apical impulse Abdomen: soft, non-tender; bowel sounds normal; no masses,  no organomegaly Extremities: extremities normal, atraumatic, no cyanosis or edema Pulses: 2+ and symmetric Skin: Skin color, texture, turgor normal. No rashes or lesions Neurologic: Alert and oriented X 3, normal strength and tone. Normal symmetric reflexes. Normal coordination and gait   Adult ECG Report  Rate: 73 ;  Rhythm: normal sinus rhythm  QRS Axis: 55 ;  PR Interval: 188 ;  QRS Duration: 92 ; QTc: 407;  Voltages: Normal -- all normal  Conduction Disturbances: none;  Other Abnormalities: none   Narrative Interpretation: Normal EKG  Recent Labs: Only CBC is available. No lipids or chemistries  ASSESSMENT / PLAN: Relatively asymptomatic gentleman with significant coronary calcification noted on CT scan. He has a scheduled High Risk operation. He does not have diabetes (therefore not on insulin), renal disease (normal creatinine), nor  does he have active anginal or heart failure symptoms.    Problem List Items Addressed This Visit    Chronic obstructive pulmonary emphysema    Not very active from a symptom standpoint.      Coronary artery calcification seen on computed tomography    Coronary calcification and a 74 year old gentleman with cardiac risk factors pending high risk surgery. Plan is to assess for potential ischemic CAD with a physiological study.  Potentially unable to walk on treadmill therefore we will proceed with Myoview stress test.      Relevant Orders   Myocardial Perfusion Imaging   EKG 12-Lead (Completed)   Essential hypertension (Chronic)    Well-controlled on ACE inhibitor and diltiazem + BB.      Relevant Orders   Myocardial Perfusion Imaging   EKG 12-Lead  (Completed)   Pre-operative clearance - Primary    Based on lack of symptoms and current risk factors, he would be considered a LOW RISK patient for a HIGH RISK surgery. Routine evaluation for ischemic coronary disease based on coronary calcification and a 74 year old former smoker with hypertension and erectile dysfunction (as a predictor of vascular disease) is reasonable. Pending findings, we may want to consider the possibility of a more aggressive lipid management.Unfortunately, he is not been able to be very active over the last month or so so we cannot be sure if any symptoms have occurred. As his planned operation is a "high risk" surgery being intrathoracic, is reasonable to evaluate on her calcification for potential ischemic coronary disease. My initial impression would be that we can consider treadmill GXT, however he tells me he may not be able to walk on treadmill if the speed increases due to his knee pain. Based on that, I think the best option would be to order a Treadmill Exercise Myoview with the option of converting to [place]. As the patient is asymptomatic, I would only react to a significantly high risk finding on this evaluation.  Plan: For risk stratification, plan Myoview stress test that allows the ability to convert from exercise to [facility].  Continue current beta blocker dose for cardiac protection       Relevant Orders   Myocardial Perfusion Imaging   EKG 12-Lead (Completed)      No orders of the defined types were placed in this encounter.   Patient Instructions: Your physician has requested that you have en exercise stress myoview. For further information please visit [email or web address]. Please follow instruction sheet, as given. If unable to complete may switch to [place].  IF TEST NORMAL , RETURN AS NEEDED - CLEARED FOR SURGERY.  INF ABNORMAL WILL MAKE AN APPOINTMENT TO SEE BACK.  Followup: pending results of test.     Leonie Man,  M.D., M.S. Interventional Cardiologist   Pager # [phone]

## 2015-01-10 NOTE — Patient Instructions (Signed)
Your physician has requested that you have en exercise stress myoview. For further information please visit [email or web address]. Please follow instruction sheet, as given. If unable to complete may switch to [place].  IF TEST NORMAL , RETURN AS NEEDED - CLEARED FOR SURGERY.  INF ABNORMAL WILL MAKE AN APPOINTMENT TO SEE BACK.

## 2015-01-11 ENCOUNTER — Encounter: Payer: Self-pay | Admitting: Cardiology

## 2015-01-11 ENCOUNTER — Telehealth (HOSPITAL_COMMUNITY): Payer: Self-pay

## 2015-01-11 DIAGNOSIS — Z01818 Encounter for other preprocedural examination: Secondary | ICD-10-CM | POA: Insufficient documentation

## 2015-01-11 DIAGNOSIS — I10 Essential (primary) hypertension: Secondary | ICD-10-CM | POA: Insufficient documentation

## 2015-01-11 NOTE — Assessment & Plan Note (Signed)
Coronary calcification and a 74 year old gentleman with cardiac risk factors pending high risk surgery. Plan is to assess for potential ischemic CAD with a physiological study.  Potentially unable to walk on treadmill therefore we will proceed with Myoview stress test.

## 2015-01-11 NOTE — Telephone Encounter (Signed)
Encounter complete. 

## 2015-01-11 NOTE — Assessment & Plan Note (Addendum)
Well-controlled on ACE inhibitor and diltiazem + BB.

## 2015-01-11 NOTE — Assessment & Plan Note (Addendum)
Based on lack of symptoms and current risk factors, he would be considered a LOW RISK patient for a HIGH RISK surgery. Routine evaluation for ischemic coronary disease based on coronary calcification and a 74 year old former smoker with hypertension and erectile dysfunction (as a predictor of vascular disease) is reasonable. Pending findings, we may want to consider the possibility of a more aggressive lipid management.Unfortunately, he is not been able to be very active over the last month or so so we cannot be sure if any symptoms have occurred. As his planned operation is a "high risk" surgery being intrathoracic, is reasonable to evaluate on her calcification for potential ischemic coronary disease. My initial impression would be that we can consider treadmill GXT, however he tells me he may not be able to walk on treadmill if the speed increases due to his knee pain. Based on that, I think the best option would be to order a Treadmill Exercise Myoview with the option of converting to [place]. As the patient is asymptomatic, I would only react to a significantly high risk finding on this evaluation.  Plan: For risk stratification, plan Myoview stress test that allows the ability to convert from exercise to [facility].  Continue current beta blocker dose for cardiac protection

## 2015-01-11 NOTE — Assessment & Plan Note (Signed)
Not very active from a symptom standpoint.

## 2015-01-12 ENCOUNTER — Telehealth (HOSPITAL_COMMUNITY): Payer: Self-pay

## 2015-01-12 NOTE — Telephone Encounter (Signed)
Encounter complete. 

## 2015-01-16 ENCOUNTER — Ambulatory Visit (HOSPITAL_COMMUNITY)
Admission: RE | Admit: 2015-01-16 | Discharge: 2015-01-16 | Disposition: A | Discharge status: Home / Self Care | Payer: Medicare Other | Source: Ambulatory Visit | Attending: Cardiovascular Disease | Admitting: Cardiovascular Disease

## 2015-01-16 DIAGNOSIS — J439 Emphysema, unspecified: Secondary | ICD-10-CM | POA: Diagnosis not present

## 2015-01-16 DIAGNOSIS — Z01818 Encounter for other preprocedural examination: Secondary | ICD-10-CM | POA: Insufficient documentation

## 2015-01-16 DIAGNOSIS — I2510 Atherosclerotic heart disease of native coronary artery without angina pectoris: Secondary | ICD-10-CM | POA: Insufficient documentation

## 2015-01-16 DIAGNOSIS — I10 Essential (primary) hypertension: Secondary | ICD-10-CM | POA: Insufficient documentation

## 2015-01-16 DIAGNOSIS — Z87891 Personal history of nicotine dependence: Secondary | ICD-10-CM | POA: Diagnosis not present

## 2015-01-16 LAB — MYOCARDIAL PERFUSION IMAGING
CHL CUP NUCLEAR SDS: 0
CHL CUP NUCLEAR SSS: 2
CHL CUP RESTING HR STRESS: 89 {beats}/min
Estimated workload: 5.5 METS
Exercise duration (min): 5 min
Exercise duration (sec): 0 s
LV sys vol: 43 mL
LVDIAVOL: 95 mL
MPHR: 146 {beats}/min
Peak HR: 153 {beats}/min
Percent HR: 104 %
RPE: 15
SRS: 2
TID: 1.06

## 2015-01-16 MED ORDER — TECHNETIUM TC 99M SESTAMIBI GENERIC - CARDIOLITE
10.8000 | Freq: Once | INTRAVENOUS | Status: AC | PRN
  Administered 2015-01-16: 11 via INTRAVENOUS

## 2015-01-16 MED ORDER — TECHNETIUM TC 99M SESTAMIBI GENERIC - CARDIOLITE
31.4000 | Freq: Once | INTRAVENOUS | Status: AC | PRN
  Administered 2015-01-16: 31 via INTRAVENOUS

## 2015-01-18 ENCOUNTER — Telehealth: Payer: Self-pay | Admitting: *Deleted

## 2015-01-18 NOTE — Telephone Encounter (Signed)
-----   Message from Leonie Man, MD sent at 01/17/2015  5:15 PM EDT ----- Stress Test looked good!! No sign of significant Heart Artery Disease.  Pump function is normal.  There should be no problems with proceeding with planned operation --> Low Risk  Good news!!.   HARDING,DAVID W, MD  Pls forward to PCP: Dr. Kathryne Eriksson

## 2015-01-18 NOTE — Telephone Encounter (Signed)
SENT IN BASKET--- PATIENT HAS CLEARANCE FOR SURGERY

## 2015-01-18 NOTE — Telephone Encounter (Signed)
Spoke to patient. Result given . Verbalized understanding  

## 2015-01-25 NOTE — Pre-Procedure Instructions (Signed)
Gomer France  01/25/2015      [id number] - [place], [place] - [address] [place] [address] [place] [address] Phone: [phone] Fax: [phone]    Your procedure is scheduled on Tuesday, January 30, 2015  Report to [facility] Admitting at 5:30 A.M.  Call this number if you have problems the morning of surgery:  [phone]   Remember:  Do not eat food or drink liquids after midnight Monday, January 29, 2015  Take these medicines the morning of surgery with A SIP OF WATER: diltiazem (CARDIZEM CD),  metoprolol succinate (TOPROL-XL), if needed: acetaminophen (TYLENOL) for pain  Stop taking vitamins and herbal medications such as Omega-3 Fatty Acids (FISH OIL) and Turmeric  .  Do not take any NSAIDs ie: Ibuprofen, Advil, Naproxen or any medication containing Aspirin such as meloxicam (MOBIC); stop now.  Do not wear jewelry, make-up or nail polish.  Do not wear lotions, powders, or perfumes.  You may not wear deodorant.  Do not shave 48 hours prior to surgery.  Men may shave face and neck.  Do not bring valuables to the hospital.  [facility] is not responsible for any belongings or valuables.  Contacts, dentures or bridgework may not be worn into surgery.  Leave your suitcase in the car.  After surgery it may be brought to your room.  For patients admitted to the hospital, discharge time will be determined by your treatment team.  Patients discharged the day of surgery will not be allowed to drive home.   Name and phone number of your driver:  Special instructions: Special Instructions:Special Instructions: [facility] - Preparing for Surgery  Before surgery, you can play an important role.  Because skin is not sterile, your skin needs to be as free of germs as possible.  You can reduce the number of germs on you skin by washing with CHG (chlorahexidine gluconate) soap before surgery.  CHG is an  antiseptic cleaner which kills germs and bonds with the skin to continue killing germs even after washing.  Please DO NOT use if you have an allergy to CHG or antibacterial soaps.  If your skin becomes reddened/irritated stop using the CHG and inform your nurse when you arrive at Short Stay.  Do not shave (including legs and underarms) for at least 48 hours prior to the first CHG shower.  You may shave your face.  Please follow these instructions carefully:   1.  Shower with CHG Soap the night before surgery and the morning of Surgery.  2.  If you choose to wash your hair, wash your hair first as usual with your normal shampoo.  3.  After you shampoo, rinse your hair and body thoroughly to remove the Shampoo.  4.  Use CHG as you would any other liquid soap.  You can apply chg directly  to the skin and wash gently with scrungie or a clean washcloth.  5.  Apply the CHG Soap to your body ONLY FROM THE NECK DOWN.  Do not use on open wounds or open sores.  Avoid contact with your eyes, ears, mouth and genitals (private parts).  Wash genitals (private parts) with your normal soap.  6.  Wash thoroughly, paying special attention to the area where your surgery will be performed.  7.  Thoroughly rinse your body with warm water from the neck down.  8.  DO NOT  shower/wash with your normal soap after using and rinsing off the CHG Soap.  9.  Pat yourself dry with a clean towel.            10.  Wear clean pajamas.            11.  Place clean sheets on your bed the night of your first shower and do not sleep with pets.  Day of Surgery  Do not apply any lotions/deodorants the morning of surgery.  Please wear clean clothes to the hospital/surgery center.  Please read over the following fact sheets that you were given. Pain Booklet, Coughing and Deep Breathing, Blood Transfusion Information, MRSA Information and Surgical Site Infection Prevention

## 2015-01-26 ENCOUNTER — Encounter (HOSPITAL_COMMUNITY)
Admission: RE | Admit: 2015-01-26 | Discharge: 2015-01-26 | Disposition: A | Discharge status: Home / Self Care | Payer: Medicare Other | Source: Ambulatory Visit | Attending: Cardiothoracic Surgery | Admitting: Cardiothoracic Surgery

## 2015-01-26 ENCOUNTER — Encounter (HOSPITAL_COMMUNITY): Payer: Self-pay

## 2015-01-26 VITALS — BP 123/59 | HR 76 | Temp 97.9°F | Resp 20 | Ht 70.0 in | Wt 170.6 lb

## 2015-01-26 DIAGNOSIS — R918 Other nonspecific abnormal finding of lung field: Secondary | ICD-10-CM | POA: Insufficient documentation

## 2015-01-26 DIAGNOSIS — Z0183 Encounter for blood typing: Secondary | ICD-10-CM | POA: Diagnosis not present

## 2015-01-26 DIAGNOSIS — Z01818 Encounter for other preprocedural examination: Secondary | ICD-10-CM | POA: Diagnosis present

## 2015-01-26 DIAGNOSIS — Z01812 Encounter for preprocedural laboratory examination: Secondary | ICD-10-CM | POA: Diagnosis not present

## 2015-01-26 DIAGNOSIS — J984 Other disorders of lung: Secondary | ICD-10-CM

## 2015-01-26 LAB — APTT: aPTT: 25 seconds (ref 24–37)

## 2015-01-26 LAB — URINALYSIS, ROUTINE W REFLEX MICROSCOPIC
Bilirubin Urine: NEGATIVE
Glucose, UA: NEGATIVE mg/dL
Hgb urine dipstick: NEGATIVE
Ketones, ur: 15 mg/dL — AB
Leukocytes, UA: NEGATIVE
Nitrite: NEGATIVE
Protein, ur: NEGATIVE mg/dL
Specific Gravity, Urine: 1.023 (ref 1.005–1.030)
Urobilinogen, UA: 0.2 mg/dL (ref 0.0–1.0)
pH: 5 (ref 5.0–8.0)

## 2015-01-26 LAB — BLOOD GAS, ARTERIAL
Acid-base deficit: 2.5 mmol/L — ABNORMAL HIGH (ref 0.0–2.0)
Bicarbonate: 20.8 mEq/L (ref 20.0–24.0)
Drawn by: 428831
FIO2: 0.21
O2 Saturation: 98.1 %
Patient temperature: 98.6
TCO2: 21.8 mmol/L (ref 0–100)
pCO2 arterial: 30 mmHg — ABNORMAL LOW (ref 35.0–45.0)
pH, Arterial: 7.456 — ABNORMAL HIGH (ref 7.350–7.450)
pO2, Arterial: 101 mmHg — ABNORMAL HIGH (ref 80.0–100.0)

## 2015-01-26 LAB — ABO/RH: ABO/RH(D): AB POS

## 2015-01-26 LAB — COMPREHENSIVE METABOLIC PANEL
ALT: 16 U/L — ABNORMAL LOW (ref 17–63)
AST: 20 U/L (ref 15–41)
Albumin: 3.9 g/dL (ref 3.5–5.0)
Alkaline Phosphatase: 45 U/L (ref 38–126)
Anion gap: 10 (ref 5–15)
BUN: 23 mg/dL — ABNORMAL HIGH (ref 6–20)
CO2: 20 mmol/L — ABNORMAL LOW (ref 22–32)
Calcium: 9.6 mg/dL (ref 8.9–10.3)
Chloride: 107 mmol/L (ref 101–111)
Creatinine, Ser: 1.14 mg/dL (ref 0.61–1.24)
GFR calc Af Amer: >60 mL/min (ref >60)
GFR calc non Af Amer: >60 mL/min (ref >60)
Glucose, Bld: 100 mg/dL — ABNORMAL HIGH (ref 65–99)
Potassium: 4.9 mmol/L (ref 3.5–5.1)
Sodium: 137 mmol/L (ref 135–145)
Total Bilirubin: 0.7 mg/dL (ref 0.3–1.2)
Total Protein: 7 g/dL (ref 6.5–8.1)

## 2015-01-26 LAB — CBC
HCT: 37.8 % — ABNORMAL LOW (ref 39.0–52.0)
Hemoglobin: 13 g/dL (ref 13.0–17.0)
MCH: 32.5 pg (ref 26.0–34.0)
MCHC: 34.4 g/dL (ref 30.0–36.0)
MCV: 94.5 fL (ref 78.0–100.0)
Platelets: 218 10*3/uL (ref 150–400)
RBC: 4 MIL/uL — ABNORMAL LOW (ref 4.22–5.81)
RDW: 12.6 % (ref 11.5–15.5)
WBC: 5.3 10*3/uL (ref 4.0–10.5)

## 2015-01-26 LAB — SURGICAL PCR SCREEN
MRSA, PCR: NEGATIVE
Staphylococcus aureus: NEGATIVE

## 2015-01-26 LAB — PROTIME-INR
INR: 0.99 (ref 0.00–1.49)
Prothrombin Time: 13.3 seconds (ref 11.6–15.2)

## 2015-01-26 LAB — TYPE AND SCREEN
ABO/RH(D): AB POS
Antibody Screen: NEGATIVE

## 2015-01-26 NOTE — Progress Notes (Signed)
   01/26/15 0948  OBSTRUCTIVE SLEEP APNEA  Have you ever been diagnosed with sleep apnea through a sleep study? No  Do you snore loudly (loud enough to be heard through closed doors)?  0  Do you often feel tired, fatigued, or sleepy during the daytime? 0  Has anyone observed you stop breathing during your sleep? 1  Do you have, or are you being treated for high blood pressure? 1  BMI more than 35 kg/m2? 0  Age over 75 years old? 1  Neck circumference greater than 40 cm/16 inches? 1  Gender: 1

## 2015-01-26 NOTE — Progress Notes (Signed)
Patient stated Dr. Servando Snare instructed him to continue his baby aspirin.

## 2015-01-29 MED ORDER — DEXTROSE 5 % IV SOLN
1.5000 g | INTRAVENOUS | Status: AC
  Administered 2015-01-30 (×2): 1.5 g via INTRAVENOUS
  Filled 2015-01-29: qty 1.5

## 2015-01-30 ENCOUNTER — Inpatient Hospital Stay (HOSPITAL_COMMUNITY): Payer: Medicare Other | Admitting: Certified Registered"

## 2015-01-30 ENCOUNTER — Encounter (HOSPITAL_COMMUNITY)
Admission: RE | Disposition: A | Discharge status: Home / Self Care | Payer: Self-pay | Source: Ambulatory Visit | Attending: Cardiothoracic Surgery

## 2015-01-30 ENCOUNTER — Encounter (HOSPITAL_COMMUNITY): Payer: Self-pay | Admitting: *Deleted

## 2015-01-30 ENCOUNTER — Inpatient Hospital Stay (HOSPITAL_COMMUNITY)
Admission: RE | Admit: 2015-01-30 | Discharge: 2015-02-07 | DRG: 164 | Disposition: A | Discharge status: Home / Self Care | Payer: Medicare Other | Source: Ambulatory Visit | Attending: Cardiothoracic Surgery | Admitting: Cardiothoracic Surgery

## 2015-01-30 ENCOUNTER — Inpatient Hospital Stay (HOSPITAL_COMMUNITY): Payer: Medicare Other

## 2015-01-30 DIAGNOSIS — I2510 Atherosclerotic heart disease of native coronary artery without angina pectoris: Secondary | ICD-10-CM | POA: Diagnosis present

## 2015-01-30 DIAGNOSIS — R911 Solitary pulmonary nodule: Secondary | ICD-10-CM | POA: Diagnosis present

## 2015-01-30 DIAGNOSIS — C3411 Malignant neoplasm of upper lobe, right bronchus or lung: Principal | ICD-10-CM | POA: Diagnosis present

## 2015-01-30 DIAGNOSIS — Z87891 Personal history of nicotine dependence: Secondary | ICD-10-CM | POA: Diagnosis not present

## 2015-01-30 DIAGNOSIS — J9382 Other air leak: Secondary | ICD-10-CM | POA: Diagnosis not present

## 2015-01-30 DIAGNOSIS — C801 Malignant (primary) neoplasm, unspecified: Secondary | ICD-10-CM

## 2015-01-30 DIAGNOSIS — J984 Other disorders of lung: Secondary | ICD-10-CM

## 2015-01-30 DIAGNOSIS — D62 Acute posthemorrhagic anemia: Secondary | ICD-10-CM | POA: Diagnosis not present

## 2015-01-30 DIAGNOSIS — J939 Pneumothorax, unspecified: Secondary | ICD-10-CM | POA: Diagnosis not present

## 2015-01-30 DIAGNOSIS — J439 Emphysema, unspecified: Secondary | ICD-10-CM | POA: Diagnosis present

## 2015-01-30 DIAGNOSIS — G2581 Restless legs syndrome: Secondary | ICD-10-CM | POA: Diagnosis present

## 2015-01-30 DIAGNOSIS — R222 Localized swelling, mass and lump, trunk: Secondary | ICD-10-CM

## 2015-01-30 DIAGNOSIS — C3490 Malignant neoplasm of unspecified part of unspecified bronchus or lung: Secondary | ICD-10-CM

## 2015-01-30 DIAGNOSIS — I739 Peripheral vascular disease, unspecified: Secondary | ICD-10-CM | POA: Diagnosis present

## 2015-01-30 DIAGNOSIS — I10 Essential (primary) hypertension: Secondary | ICD-10-CM | POA: Diagnosis present

## 2015-01-30 DIAGNOSIS — C3410 Malignant neoplasm of upper lobe, unspecified bronchus or lung: Secondary | ICD-10-CM | POA: Diagnosis present

## 2015-01-30 DIAGNOSIS — Z902 Acquired absence of lung [part of]: Secondary | ICD-10-CM

## 2015-01-30 HISTORY — PX: VIDEO BRONCHOSCOPY: SHX5072

## 2015-01-30 HISTORY — PX: VIDEO ASSISTED THORACOSCOPY (VATS)/WEDGE RESECTION: SHX6174

## 2015-01-30 SURGERY — BRONCHOSCOPY, VIDEO-ASSISTED
Anesthesia: General | Site: Chest | Laterality: Right

## 2015-01-30 MED ORDER — PHENYLEPHRINE HCL 10 MG/ML IJ SOLN
INTRAMUSCULAR | Status: DC | PRN
  Administered 2015-01-30 (×2): 40 ug via INTRAVENOUS

## 2015-01-30 MED ORDER — MIDAZOLAM HCL 2 MG/2ML IJ SOLN
INTRAMUSCULAR | Status: AC
  Filled 2015-01-30: qty 4

## 2015-01-30 MED ORDER — SODIUM CHLORIDE 0.9 % IJ SOLN: 9.0000 mL | INTRAMUSCULAR | Status: DC | PRN

## 2015-01-30 MED ORDER — ONDANSETRON HCL 4 MG/2ML IJ SOLN
INTRAMUSCULAR | Status: DC | PRN
  Administered 2015-01-30: 4 mg via INTRAVENOUS

## 2015-01-30 MED ORDER — PHENYLEPHRINE HCL 10 MG/ML IJ SOLN
10.0000 mg | INTRAMUSCULAR | Status: DC | PRN
  Administered 2015-01-30: 20 ug/min via INTRAVENOUS

## 2015-01-30 MED ORDER — GLYCOPYRROLATE 0.2 MG/ML IJ SOLN
INTRAMUSCULAR | Status: DC | PRN
  Administered 2015-01-30 (×2): 0.1 mg via INTRAVENOUS
  Administered 2015-01-30: 0.6 mg via INTRAVENOUS

## 2015-01-30 MED ORDER — MIDAZOLAM HCL 5 MG/5ML IJ SOLN
INTRAMUSCULAR | Status: DC | PRN
  Administered 2015-01-30 (×2): 1 mg via INTRAVENOUS

## 2015-01-30 MED ORDER — NEOSTIGMINE METHYLSULFATE 10 MG/10ML IV SOLN
INTRAVENOUS | Status: DC | PRN
  Administered 2015-01-30: 4 mg via INTRAVENOUS

## 2015-01-30 MED ORDER — EPHEDRINE SULFATE 50 MG/ML IJ SOLN
INTRAMUSCULAR | Status: AC
  Filled 2015-01-30: qty 1

## 2015-01-30 MED ORDER — ACETAMINOPHEN 160 MG/5ML PO SOLN
1000.0000 mg | Freq: Four times a day (QID) | ORAL | Status: AC
Start: 2015-01-30 — End: 2015-02-04
  Filled 2015-01-30: qty 40

## 2015-01-30 MED ORDER — DEXTROSE 5 % IV SOLN
1.5000 g | Freq: Once | INTRAVENOUS | Status: DC
  Filled 2015-01-30: qty 1.5

## 2015-01-30 MED ORDER — MELOXICAM 15 MG PO TABS: 15.0000 mg | ORAL_TABLET | Freq: Every day | ORAL | Status: DC

## 2015-01-30 MED ORDER — ACETAMINOPHEN 325 MG PO TABS: 325.0000 mg | ORAL_TABLET | ORAL | Status: DC | PRN

## 2015-01-30 MED ORDER — HEMOSTATIC AGENTS (NO CHARGE) OPTIME
TOPICAL | Status: DC | PRN
  Administered 2015-01-30: 1 via TOPICAL

## 2015-01-30 MED ORDER — LISINOPRIL 20 MG PO TABS
20.0000 mg | ORAL_TABLET | Freq: Every morning | ORAL | Status: DC
  Administered 2015-01-31 – 2015-02-07 (×8): 20 mg via ORAL
  Filled 2015-01-30 (×8): qty 1

## 2015-01-30 MED ORDER — OXYCODONE HCL 5 MG PO TABS
ORAL_TABLET | ORAL | Status: AC
  Filled 2015-01-30: qty 1

## 2015-01-30 MED ORDER — HYDROMORPHONE HCL 1 MG/ML IJ SOLN
0.2500 mg | INTRAMUSCULAR | Status: DC | PRN
  Administered 2015-01-30 (×4): 0.5 mg via INTRAVENOUS

## 2015-01-30 MED ORDER — HYDROMORPHONE HCL 1 MG/ML IJ SOLN
INTRAMUSCULAR | Status: AC
  Filled 2015-01-30: qty 1

## 2015-01-30 MED ORDER — DIPHENHYDRAMINE HCL 12.5 MG/5ML PO ELIX
12.5000 mg | ORAL_SOLUTION | Freq: Four times a day (QID) | ORAL | Status: DC | PRN
  Filled 2015-01-30: qty 5

## 2015-01-30 MED ORDER — SODIUM CHLORIDE 0.9 % IJ SOLN
INTRAMUSCULAR | Status: AC
  Filled 2015-01-30: qty 10

## 2015-01-30 MED ORDER — ACETAMINOPHEN 500 MG PO TABS
1000.0000 mg | ORAL_TABLET | Freq: Four times a day (QID) | ORAL | Status: AC
Start: 2015-01-30 — End: 2015-02-04
  Administered 2015-01-30 – 2015-02-04 (×16): 1000 mg via ORAL
  Filled 2015-01-30 (×22): qty 2

## 2015-01-30 MED ORDER — SUCCINYLCHOLINE CHLORIDE 20 MG/ML IJ SOLN
INTRAMUSCULAR | Status: AC
  Filled 2015-01-30: qty 1

## 2015-01-30 MED ORDER — PROPOFOL 10 MG/ML IV BOLUS
INTRAVENOUS | Status: AC
  Filled 2015-01-30: qty 20

## 2015-01-30 MED ORDER — METOCLOPRAMIDE HCL 5 MG/ML IJ SOLN
10.0000 mg | Freq: Four times a day (QID) | INTRAMUSCULAR | Status: AC
  Administered 2015-01-30 – 2015-01-31 (×4): 10 mg via INTRAVENOUS
  Filled 2015-01-30 (×3): qty 2

## 2015-01-30 MED ORDER — GLYCOPYRROLATE 0.2 MG/ML IJ SOLN
INTRAMUSCULAR | Status: AC
  Filled 2015-01-30: qty 3

## 2015-01-30 MED ORDER — PHENYLEPHRINE 40 MCG/ML (10ML) SYRINGE FOR IV PUSH (FOR BLOOD PRESSURE SUPPORT)
PREFILLED_SYRINGE | INTRAVENOUS | Status: AC
  Filled 2015-01-30: qty 10

## 2015-01-30 MED ORDER — SUFENTANIL CITRATE 50 MCG/ML IV SOLN
INTRAVENOUS | Status: AC
  Filled 2015-01-30: qty 1

## 2015-01-30 MED ORDER — ACETAMINOPHEN 160 MG/5ML PO SOLN
325.0000 mg | ORAL | Status: DC | PRN
Start: 2015-01-30 — End: 2015-01-30
  Filled 2015-01-30: qty 20.3

## 2015-01-30 MED ORDER — FENTANYL 10 MCG/ML IV SOLN
INTRAVENOUS | Status: DC
  Administered 2015-01-30: 10 ug via INTRAVENOUS
  Administered 2015-01-30: 14:00:00 via INTRAVENOUS
  Administered 2015-01-31 (×2): 10 ug via INTRAVENOUS
  Administered 2015-01-31: 20 ug via INTRAVENOUS
  Administered 2015-01-31 – 2015-02-01 (×3): 10 ug via INTRAVENOUS
  Administered 2015-02-01: 20 ug via INTRAVENOUS
  Administered 2015-02-01: 0 ug via INTRAVENOUS
  Administered 2015-02-02: 10 ug via INTRAVENOUS
  Filled 2015-01-30 (×2): qty 50

## 2015-01-30 MED ORDER — ROCURONIUM BROMIDE 100 MG/10ML IV SOLN
INTRAVENOUS | Status: DC | PRN
  Administered 2015-01-30: 50 mg via INTRAVENOUS
  Administered 2015-01-30 (×2): 5 mg via INTRAVENOUS
  Administered 2015-01-30: 50 mg via INTRAVENOUS
  Administered 2015-01-30: 10 mg via INTRAVENOUS
  Administered 2015-01-30 (×2): 5 mg via INTRAVENOUS
  Administered 2015-01-30: 10 mg via INTRAVENOUS
  Administered 2015-01-30: 5 mg via INTRAVENOUS

## 2015-01-30 MED ORDER — DILTIAZEM HCL ER COATED BEADS 180 MG PO CP24
180.0000 mg | ORAL_CAPSULE | Freq: Every morning | ORAL | Status: DC
  Administered 2015-01-31 – 2015-02-07 (×8): 180 mg via ORAL
  Filled 2015-01-30 (×8): qty 1

## 2015-01-30 MED ORDER — DEXAMETHASONE SODIUM PHOSPHATE 4 MG/ML IJ SOLN
INTRAMUSCULAR | Status: AC
  Filled 2015-01-30: qty 1

## 2015-01-30 MED ORDER — BISACODYL 5 MG PO TBEC
10.0000 mg | DELAYED_RELEASE_TABLET | Freq: Every day | ORAL | Status: DC
  Administered 2015-01-31 – 2015-02-07 (×7): 10 mg via ORAL
  Filled 2015-01-30 (×6): qty 2

## 2015-01-30 MED ORDER — METOPROLOL SUCCINATE ER 100 MG PO TB24
100.0000 mg | ORAL_TABLET | Freq: Every morning | ORAL | Status: DC
  Administered 2015-01-31 – 2015-02-07 (×8): 100 mg via ORAL
  Filled 2015-01-30 (×8): qty 1

## 2015-01-30 MED ORDER — NALOXONE HCL 0.4 MG/ML IJ SOLN
0.4000 mg | INTRAMUSCULAR | Status: DC | PRN
  Filled 2015-01-30: qty 1

## 2015-01-30 MED ORDER — GABAPENTIN 300 MG PO CAPS
600.0000 mg | ORAL_CAPSULE | Freq: Every day | ORAL | Status: DC
  Administered 2015-01-30 – 2015-02-06 (×8): 600 mg via ORAL
  Filled 2015-01-30 (×12): qty 2

## 2015-01-30 MED ORDER — ONDANSETRON HCL 4 MG/2ML IJ SOLN: 4.0000 mg | Freq: Four times a day (QID) | INTRAMUSCULAR | Status: DC | PRN

## 2015-01-30 MED ORDER — GLYCOPYRROLATE 0.2 MG/ML IJ SOLN
INTRAMUSCULAR | Status: AC
  Filled 2015-01-30: qty 1

## 2015-01-30 MED ORDER — POTASSIUM CHLORIDE 10 MEQ/50ML IV SOLN: 10.0000 meq | Freq: Every day | INTRAVENOUS | Status: DC | PRN

## 2015-01-30 MED ORDER — ROCURONIUM BROMIDE 50 MG/5ML IV SOLN
INTRAVENOUS | Status: AC
  Filled 2015-01-30: qty 1

## 2015-01-30 MED ORDER — CETYLPYRIDINIUM CHLORIDE 0.05 % MT LIQD
7.0000 mL | Freq: Two times a day (BID) | OROMUCOSAL | Status: DC
  Administered 2015-01-30 – 2015-02-05 (×12): 7 mL via OROMUCOSAL

## 2015-01-30 MED ORDER — 0.9 % SODIUM CHLORIDE (POUR BTL) OPTIME
TOPICAL | Status: DC | PRN
  Administered 2015-01-30 (×2): 1000 mL

## 2015-01-30 MED ORDER — BUPIVACAINE ON-Q PAIN PUMP (FOR ORDER SET NO CHG)
INJECTION | Status: DC
  Filled 2015-01-30: qty 1

## 2015-01-30 MED ORDER — DIPHENHYDRAMINE HCL 50 MG/ML IJ SOLN
12.5000 mg | Freq: Four times a day (QID) | INTRAMUSCULAR | Status: DC | PRN
  Filled 2015-01-30: qty 0.25

## 2015-01-30 MED ORDER — PROPOFOL 10 MG/ML IV BOLUS
INTRAVENOUS | Status: DC | PRN
  Administered 2015-01-30: 30 mg via INTRAVENOUS
  Administered 2015-01-30: 40 mg via INTRAVENOUS
  Administered 2015-01-30: 90 mg via INTRAVENOUS

## 2015-01-30 MED ORDER — LIDOCAINE HCL (CARDIAC) 20 MG/ML IV SOLN
INTRAVENOUS | Status: AC
  Filled 2015-01-30: qty 5

## 2015-01-30 MED ORDER — DEXTROSE 5 % IV SOLN
1.5000 g | Freq: Two times a day (BID) | INTRAVENOUS | Status: AC
  Administered 2015-01-31 (×2): 1.5 g via INTRAVENOUS
  Filled 2015-01-30 (×2): qty 1.5

## 2015-01-30 MED ORDER — BUPIVACAINE 0.5 % ON-Q PUMP SINGLE CATH 400 ML
400.0000 mL | INJECTION | Status: DC
  Filled 2015-01-30: qty 400

## 2015-01-30 MED ORDER — OXYCODONE HCL 5 MG PO TABS
5.0000 mg | ORAL_TABLET | Freq: Once | ORAL | Status: AC | PRN
  Administered 2015-01-30: 5 mg via ORAL

## 2015-01-30 MED ORDER — SUFENTANIL CITRATE 50 MCG/ML IV SOLN
INTRAVENOUS | Status: DC | PRN
  Administered 2015-01-30: 5 ug via INTRAVENOUS
  Administered 2015-01-30 (×4): 10 ug via INTRAVENOUS
  Administered 2015-01-30: 5 ug via INTRAVENOUS
  Administered 2015-01-30: 10 ug via INTRAVENOUS
  Administered 2015-01-30 (×3): 5 ug via INTRAVENOUS

## 2015-01-30 MED ORDER — LACTATED RINGERS IV SOLN
INTRAVENOUS | Status: DC | PRN
  Administered 2015-01-30: 07:00:00 via INTRAVENOUS

## 2015-01-30 MED ORDER — SENNOSIDES-DOCUSATE SODIUM 8.6-50 MG PO TABS
1.0000 | ORAL_TABLET | Freq: Every day | ORAL | Status: DC
  Administered 2015-01-30 – 2015-02-03 (×4): 1 via ORAL
  Filled 2015-01-30 (×10): qty 1

## 2015-01-30 MED ORDER — OXYCODONE HCL 5 MG/5ML PO SOLN: 5.0000 mg | Freq: Once | ORAL | Status: AC | PRN

## 2015-01-30 MED ORDER — VANCOMYCIN HCL IN DEXTROSE 1-5 GM/200ML-% IV SOLN
1000.0000 mg | Freq: Two times a day (BID) | INTRAVENOUS | Status: AC
  Administered 2015-01-30: 1000 mg via INTRAVENOUS
  Filled 2015-01-30: qty 200

## 2015-01-30 MED ORDER — LACTATED RINGERS IV SOLN
INTRAVENOUS | Status: DC | PRN
  Administered 2015-01-30 (×2): via INTRAVENOUS

## 2015-01-30 MED ORDER — ASPIRIN EC 81 MG PO TBEC
81.0000 mg | DELAYED_RELEASE_TABLET | Freq: Every morning | ORAL | Status: DC
  Administered 2015-01-31 – 2015-02-07 (×8): 81 mg via ORAL
  Filled 2015-01-30 (×9): qty 1

## 2015-01-30 MED ORDER — DEXAMETHASONE SODIUM PHOSPHATE 4 MG/ML IJ SOLN
INTRAMUSCULAR | Status: DC | PRN
  Administered 2015-01-30: 4 mg via INTRAVENOUS

## 2015-01-30 MED ORDER — BUPIVACAINE 0.5 % ON-Q PUMP SINGLE CATH 400 ML
INJECTION | Status: DC | PRN
Start: 2015-01-30 — End: 2015-01-30
  Administered 2015-01-30: 400 mL

## 2015-01-30 MED ORDER — FISH OIL 1000 MG PO CAPS: 1000.0000 mg | ORAL_CAPSULE | Freq: Every day | ORAL | Status: DC

## 2015-01-30 MED ORDER — ONDANSETRON HCL 4 MG/2ML IJ SOLN
4.0000 mg | Freq: Four times a day (QID) | INTRAMUSCULAR | Status: DC | PRN
  Filled 2015-01-30: qty 2

## 2015-01-30 MED ORDER — NEOSTIGMINE METHYLSULFATE 10 MG/10ML IV SOLN
INTRAVENOUS | Status: AC
  Filled 2015-01-30: qty 3

## 2015-01-30 MED ORDER — DEXTROSE-NACL 5-0.45 % IV SOLN
INTRAVENOUS | Status: DC
  Administered 2015-01-30: 16:00:00 via INTRAVENOUS

## 2015-01-30 SURGICAL SUPPLY — 91 items
APPLICATOR TIP COSEAL (VASCULAR PRODUCTS) IMPLANT
APPLICATOR TIP EXT COSEAL (VASCULAR PRODUCTS) IMPLANT
BLADE SURG 11 STRL SS (BLADE) ×3 IMPLANT
BRUSH CYTOL CELLEBRITY 1.5X140 (MISCELLANEOUS) IMPLANT
CANISTER SUCTION 2500CC (MISCELLANEOUS) ×3 IMPLANT
CATH KIT ON Q 5IN SLV (PAIN MANAGEMENT) ×3 IMPLANT
CATH THORACIC 28FR (CATHETERS) ×6 IMPLANT
CATH THORACIC 36FR (CATHETERS) IMPLANT
CATH THORACIC 36FR RT ANG (CATHETERS) IMPLANT
CLIP TI MEDIUM 6 (CLIP) ×3 IMPLANT
CONN ST 1/4X3/8  BEN (MISCELLANEOUS)
CONN ST 1/4X3/8 BEN (MISCELLANEOUS) IMPLANT
CONT SPEC 4OZ CLIKSEAL STRL BL (MISCELLANEOUS) ×30 IMPLANT
COVER TABLE BACK 60X90 (DRAPES) ×3 IMPLANT
DERMABOND ADVANCED (GAUZE/BANDAGES/DRESSINGS) ×1
DERMABOND ADVANCED .7 DNX12 (GAUZE/BANDAGES/DRESSINGS) ×2 IMPLANT
DRAIN CHANNEL 28F RND 3/8 FF (WOUND CARE) IMPLANT
DRAIN CHANNEL 32F RND 10.7 FF (WOUND CARE) IMPLANT
DRAPE LAPAROSCOPIC ABDOMINAL (DRAPES) ×3 IMPLANT
DRAPE WARM FLUID 44X44 (DRAPE) ×3 IMPLANT
DRILL BIT 7/64X5 (BIT) IMPLANT
ELECT BLADE 4.0 EZ CLEAN MEGAD (MISCELLANEOUS) ×3
ELECT REM PT RETURN 9FT ADLT (ELECTROSURGICAL) ×3
ELECTRODE BLDE 4.0 EZ CLN MEGD (MISCELLANEOUS) ×2 IMPLANT
ELECTRODE REM PT RTRN 9FT ADLT (ELECTROSURGICAL) ×2 IMPLANT
FORCEPS BIOP RJ4 1.8 (CUTTING FORCEPS) IMPLANT
GAUZE SPONGE 4X4 12PLY STRL (GAUZE/BANDAGES/DRESSINGS) ×3 IMPLANT
GLOVE BIO SURGEON STRL SZ 6 (GLOVE) ×3 IMPLANT
GLOVE BIO SURGEON STRL SZ 6.5 (GLOVE) ×9 IMPLANT
GLOVE ECLIPSE 6.5 STRL STRAW (GLOVE) ×3 IMPLANT
GOWN STRL REUS W/ TWL LRG LVL3 (GOWN DISPOSABLE) ×10 IMPLANT
GOWN STRL REUS W/TWL LRG LVL3 (GOWN DISPOSABLE) ×5
KIT BASIN OR (CUSTOM PROCEDURE TRAY) ×3 IMPLANT
KIT CLEAN ENDO COMPLIANCE (KITS) ×3 IMPLANT
KIT ROOM TURNOVER OR (KITS) ×3 IMPLANT
KIT SUCTION CATH 14FR (SUCTIONS) ×3 IMPLANT
LIQUID BAND (GAUZE/BANDAGES/DRESSINGS) ×3 IMPLANT
MARKER SKIN DUAL TIP RULER LAB (MISCELLANEOUS) ×3 IMPLANT
NEEDLE BIOPSY TRANSBRONCH 21G (NEEDLE) IMPLANT
NS IRRIG 1000ML POUR BTL (IV SOLUTION) ×12 IMPLANT
OIL SILICONE PENTAX (PARTS (SERVICE/REPAIRS)) ×3 IMPLANT
PACK CHEST (CUSTOM PROCEDURE TRAY) ×3 IMPLANT
PAD ARMBOARD 7.5X6 YLW CONV (MISCELLANEOUS) ×9 IMPLANT
PASSER SUT SWANSON 36MM LOOP (INSTRUMENTS) ×3 IMPLANT
POUCH ENDO CATCH II 15MM (MISCELLANEOUS) ×3 IMPLANT
RELOAD GOLD ECHELON 45 (STAPLE) ×24 IMPLANT
RELOAD GREEN ECHELON 45 (STAPLE) ×3 IMPLANT
SCISSORS LAP 5X35 DISP (ENDOMECHANICALS) ×3 IMPLANT
SEALANT PROGEL (MISCELLANEOUS) ×3 IMPLANT
SEALANT SURG COSEAL 4ML (VASCULAR PRODUCTS) IMPLANT
SEALANT SURG COSEAL 8ML (VASCULAR PRODUCTS) IMPLANT
SOLUTION ANTI FOG 6CC (MISCELLANEOUS) ×3 IMPLANT
SPONGE GAUZE 4X4 12PLY STER LF (GAUZE/BANDAGES/DRESSINGS) ×3 IMPLANT
SPONGE INTESTINAL PEANUT (DISPOSABLE) ×3 IMPLANT
STAPLE RELOAD 2.5MM WHITE (STAPLE) ×18 IMPLANT
STAPLER ECHELON POWERED (MISCELLANEOUS) ×3 IMPLANT
STAPLER VASCULAR ECHELON 35 (CUTTER) ×3 IMPLANT
SUT PROLENE 3 0 SH DA (SUTURE) IMPLANT
SUT PROLENE 4 0 RB 1 (SUTURE)
SUT PROLENE 4-0 RB1 .5 CRCL 36 (SUTURE) IMPLANT
SUT SILK  1 MH (SUTURE) ×4
SUT SILK 1 MH (SUTURE) ×8 IMPLANT
SUT SILK 1 TIES 10X30 (SUTURE) IMPLANT
SUT SILK 2 0 SH (SUTURE) IMPLANT
SUT SILK 2 0SH CR/8 30 (SUTURE) ×3 IMPLANT
SUT SILK 3 0SH CR/8 30 (SUTURE) IMPLANT
SUT STEEL 1 (SUTURE) IMPLANT
SUT VIC AB 1 CTX 18 (SUTURE) IMPLANT
SUT VIC AB 1 CTX 36 (SUTURE) ×1
SUT VIC AB 1 CTX36XBRD ANBCTR (SUTURE) ×2 IMPLANT
SUT VIC AB 2-0 CTX 36 (SUTURE) ×3 IMPLANT
SUT VIC AB 2-0 UR6 27 (SUTURE) IMPLANT
SUT VIC AB 3-0 SH 8-18 (SUTURE) ×3 IMPLANT
SUT VIC AB 3-0 X1 27 (SUTURE) ×3 IMPLANT
SUT VICRYL 0 UR6 27IN ABS (SUTURE) IMPLANT
SUT VICRYL 2 TP 1 (SUTURE) ×3 IMPLANT
SWAB COLLECTION DEVICE MRSA (MISCELLANEOUS) IMPLANT
SYR 20ML ECCENTRIC (SYRINGE) ×3 IMPLANT
SYSTEM SAHARA CHEST DRAIN ATS (WOUND CARE) ×3 IMPLANT
TAPE CLOTH SURG 4X10 WHT LF (GAUZE/BANDAGES/DRESSINGS) ×3 IMPLANT
TAPE UMBILICAL COTTON 1/8X30 (MISCELLANEOUS) ×3 IMPLANT
TIP APPLICATOR SPRAY EXTEND 16 (VASCULAR PRODUCTS) ×3 IMPLANT
TOWEL OR 17X24 6PK STRL BLUE (TOWEL DISPOSABLE) ×6 IMPLANT
TOWEL OR 17X26 10 PK STRL BLUE (TOWEL DISPOSABLE) ×6 IMPLANT
TRAP SPECIMEN MUCOUS 40CC (MISCELLANEOUS) ×3 IMPLANT
TRAY FOLEY CATH 16FRSI W/METER (SET/KITS/TRAYS/PACK) ×3 IMPLANT
TROCAR XCEL BLUNT TIP 100MML (ENDOMECHANICALS) ×3 IMPLANT
TUBE ANAEROBIC SPECIMEN COL (MISCELLANEOUS) IMPLANT
TUBE CONNECTING 20X1/4 (TUBING) ×6 IMPLANT
TUNNELER SHEATH ON-Q 11GX8 DSP (PAIN MANAGEMENT) ×3 IMPLANT
WATER STERILE IRR 1000ML POUR (IV SOLUTION) ×6 IMPLANT

## 2015-01-30 NOTE — Brief Op Note (Signed)
01/30/2015  12:55 PM  PATIENT:  Donald Delacruz  74 y.o. male  PRE-OPERATIVE DIAGNOSIS:  RUL MASS  POST-OPERATIVE DIAGNOSIS:  right upper lung mass  PROCEDURE:  Procedure(s):  VIDEO BRONCHOSCOPY (N/A)  RIGHT VIDEO ASSISTED THORACOSCOPY/MINI THORACOTOMY -Right Upper Lobectomy -Lymph Node Sampling -Insertion of ON-Q  SURGEON:  Surgeon(s) and Role:    * [name], MD - Primary  PHYSICIAN ASSISTANT: Junie Panning Contessa Preuss PA-C      Andreas Blower  PA-C  ANESTHESIA:   general  EBL:  Total I/O In: 4268 [I.V.:1000; IV Piggyback:50] Out: 62 [Urine:290; Blood:400]  BLOOD ADMINISTERED:none  DRAINS: Right Pleural Chest Tubes   LOCAL MEDICATIONS USED:  MARCAINE     SPECIMEN:  Source of Specimen:  Right Upper Lobe, Lymph Nodes  DISPOSITION OF SPECIMEN:  PATHOLOGY  COUNTS:  YES  TOURNIQUET:  * No tourniquets in log *  DICTATION: .Dragon Dictation  PLAN OF CARE: Admit to inpatient   PATIENT DISPOSITION:  ICU - intubated and hemodynamically stable.   Delay start of Pharmacological VTE agent (>24hrs) due to surgical blood loss or risk of bleeding: yes

## 2015-01-30 NOTE — Anesthesia Procedure Notes (Signed)
Procedure Name: Intubation Date/Time: 01/30/2015 7:44 AM Performed by: Melina Copa, Dorrine Montone R Pre-anesthesia Checklist: Patient identified, Timeout performed, Emergency Drugs available, Suction available and Patient being monitored Patient Re-evaluated:Patient Re-evaluated prior to inductionOxygen Delivery Method: Circle system utilized Preoxygenation: Pre-oxygenation with 100% oxygen Intubation Type: IV induction Ventilation: Mask ventilation without difficulty Laryngoscope Size: Miller and 3 Grade View: Grade I Tube type: Oral Endobronchial tube: Left, Double lumen EBT, EBT position confirmed by fiberoptic bronchoscope and EBT position confirmed by auscultation and 41 Fr Number of attempts: 1 Airway Equipment and Method: Stylet Placement Confirmation: ETT inserted through vocal cords under direct vision,  positive ETCO2 and breath sounds checked- equal and bilateral Tube secured with: Tape Dental Injury: Teeth and Oropharynx as per pre-operative assessment

## 2015-01-30 NOTE — Transfer of Care (Signed)
Immediate Anesthesia Transfer of Care Note  Patient: Donald Delacruz  Procedure(s) Performed: Procedure(s): VIDEO BRONCHOSCOPY (N/A) Right VIDEO ASSISTED THORACOSCOPY, Right upper Lobectomy with lymph node dissection and ONQ insertion (Right)  Patient Location: PACU  Anesthesia Type:General  Level of Consciousness: awake, alert , oriented and patient cooperative  Airway & Oxygen Therapy: Patient Spontanous Breathing and Patient connected to nasal cannula oxygen  Post-op Assessment: Report given to RN, Post -op Vital signs reviewed and stable and Patient moving all extremities X 4  Post vital signs: Reviewed and stable  Last Vitals:  Filed Vitals:   01/30/15 1353  BP: 134/71  Pulse: 73  Temp: 36.3 C  Resp: 13    Complications: No apparent anesthesia complications

## 2015-01-30 NOTE — Progress Notes (Signed)
SICU p.m. Rounds  Patient examined and record reviewed.Hemodynamics stable,labs satisfactory.Patient had stable day.Continue current care. Tharon Aquas Trigt III 01/30/2015

## 2015-01-30 NOTE — Anesthesia Preprocedure Evaluation (Addendum)
Anesthesia Evaluation  Patient identified by MRN, date of birth, ID band Patient awake    Reviewed: Allergy & Precautions, NPO status , Patient's Chart, lab work & pertinent test results  History of Anesthesia Complications Negative for: history of anesthetic complications  Airway Mallampati: II  TM Distance: >3 FB Neck ROM: Full    Dental  (+) Teeth Intact, Dental Advisory Given   Pulmonary former smoker,  breath sounds clear to auscultation        Cardiovascular hypertension, Pt. on medications and Pt. on home beta blockers + CAD and + Peripheral Vascular Disease Rhythm:Regular     Neuro/Psych negative neurological ROS  negative psych ROS   GI/Hepatic negative GI ROS, Neg liver ROS,   Endo/Other    Renal/GU Renal InsufficiencyRenal disease     Musculoskeletal   Abdominal   Peds  Hematology   Anesthesia Other Findings   Reproductive/Obstetrics                            Anesthesia Physical Anesthesia Plan  ASA: III  Anesthesia Plan: General   Post-op Pain Management:    Induction: Intravenous  Airway Management Planned: Double Lumen EBT  Additional Equipment: Arterial line  Intra-op Plan:   Post-operative Plan: Extubation in OR  Informed Consent: I have reviewed the patients History and Physical, chart, labs and discussed the procedure including the risks, benefits and alternatives for the proposed anesthesia with the patient or authorized representative who has indicated his/her understanding and acceptance.   Dental advisory given  Plan Discussed with: CRNA, Anesthesiologist and Surgeon  Anesthesia Plan Comments:         Anesthesia Quick Evaluation

## 2015-01-30 NOTE — H&P (Signed)
Donald Delacruz [address]       Donald Delacruz,Donald Delacruz [address]             [phone]                    [name] Medical Record #[id number] Date of Birth: 1941-02-14  Referring:[name], MD Primary Care: [name], MD  Chief Complaint:    Lung Cancer  History of Present Illness:    Donald Delacruz 74 y.o. male has been  seen in the office   for  Right upper lung lobe nodules. Patient had routine chest xray which was abnormal  Followed by ct of chest in Nov 2015. Follow up ct done in early June 2016 and patient referred to Thoracic surgery for evaluation. Patient is previous smoker for 16 years stopped in 1999.  He has not had any occupation exposure to asbestosis, working primarily in office environment. He denies any previous cardiac history. Patient  had needle biopsy of right lung lesion  PFT's have been done. Cardiac clearance obtained.   Current Activity/ Functional Status:  Patient is independent with mobility/ambulation, transfers, ADL's, IADL's.   Zubrod Score: At the time of surgery this patient's most appropriate activity status/level should be described as: '[x]'$     0    Normal activity, no symptoms '[]'$     1    Restricted in physical strenuous activity but ambulatory, able to do out light work '[]'$     2    Ambulatory and capable of self care, unable to do work activities, up and about               >50 % of waking hours                              '[]'$     3    Only limited self care, in bed greater than 50% of waking hours '[]'$     4    Completely disabled, no self care, confined to bed or chair '[]'$     5    Moribund   Past Medical History  Diagnosis Date  . Essential hypertension   . ED (erectile dysfunction)   . Insomnia   . Lipoma   . Osteoarthritis   . Emphysema lung   . Restless legs syndrome   . Coronary artery calcification seen on computed tomography June 2016    CT scan of chest: Coronary artery calcifications are noted  . Thoracic  aortic atherosclerosis June 2016    CT scan chest: Atherosclerosis of thoracic aorta is noted without aneurysm or dissection. Visualized portion of upper abdomen demonstrates stable calcified splenic artery and right renal  . 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 Cataract extraction    . Pilonidal cyst excision      Family History  Problem Relation Age of Onset  . Lung cancer Mother     History   Social History  . Marital Status: Married    Spouse Name: N/A  . Number of Children: N/A  . Years of Education: N/A   Occupational History  . Office work   Social History Main Topics  . Smoking status: Former Smoker -- 1.00 packs/day for 39 years    Types: Cigarettes  Quit date: 06/30/1997  . Smokeless tobacco: Never Used     Comment: SMOKED PIPES ALSO  . Alcohol Use: Not on file  . Drug Use: Not on file  . Sexual Activity: Not on file      History  Smoking status  . Former Smoker -- 1.00 packs/day for 39 years  . Types: Cigarettes  . Quit date: 06/30/1997  Smokeless tobacco  . Never Used    Comment: SMOKED PIPES ALSO    History  Alcohol Use  . 0.0 oz/week  . 0 Standard drinks or equivalent per week    Comment: 3-4 BEERS PER DAY     Allergies  Allergen Reactions  . Amlodipine Swelling  . Benicar [Olmesartan]     Dizziness,malaise   . Nisoldipine Swelling    Current Facility-Administered Medications  Medication Dose Route Frequency Provider Last Rate Last Dose  . cefUROXime (ZINACEF) 1.5 g in dextrose 5 % 50 mL IVPB  1.5 g Intravenous To SS-Surg [name], MD   1.5 g at 01/30/15 0710   Facility-Administered Medications Ordered in Other Encounters  Medication Dose Route Frequency Provider Last Rate Last Dose  . lactated ringers infusion    Continuous PRN Moshe Salisbury, CRNA      . lactated ringers infusion    Continuous PRN  Moshe Salisbury, CRNA      . midazolam (VERSED) 5 MG/5ML injection    Anesthesia Intra-op Moshe Salisbury, CRNA   1 mg at 01/30/15 1601  . SUFentanil (SUFENTA) injection    Anesthesia Intra-op Moshe Salisbury, CRNA   5 mcg at 01/30/15 0932      Review of Systems:     Cardiac Review of Systems: Y or N  Chest Pain [ N   ]  Resting SOB [ N  ] Exertional SOB  Aqua.Slicker  ]  Vertell Limber Aqua.Slicker  ]   Pedal Edema [  N ]    Palpitations [ N ] Syncope  Aqua.Slicker  ]   Presyncope [ N  ]  General Review of Systems: [Y] = yes [  ]=no Constitional: recent weight change [ N ];  Wt loss over the last 3 months [   ] anorexia [  ]; fatigue Aqua.Slicker  ]; nausea [  ]; night sweats [  ]; fever Aqua.Slicker  ]; or chills [ N ];          Dental: poor dentition[N  ]; Last Dentist visit:   Eye : blurred vision [ N ]; diplopia [   ]; vision changes [  ];  Amaurosis fugax[N  ]; Resp: cough [ N ];  wheezing[ N ];  hemoptysis[ N ]; shortness of breath[  N]; paroxysmal nocturnal dyspnea[ N ]; dyspnea on exertion[ N ]; or orthopnea[  ];  GI:  gallstones[  ], vomiting[  ];  dysphagia[  ]; melena[  ];  hematochezia [  ]; heartburn[  ];   Hx of  Colonoscopy[  ]; GU: kidney stones [  ]; hematuria[  ];   dysuria [  ];  nocturia[  ];  history of     obstruction [  ]; urinary frequency [  ]             Skin: rash, swelling[  ];, hair loss[  ];  peripheral edema[  ];  or itching[  ]; Musculosketetal: myalgias[  ];  joint swelling[  ];  joint erythema[  ];  joint pain[Y  ];  back pain[  N ];  Heme/Lymph: bruising[ Y ];  bleeding[  ];  anemia[  ];  Neuro: TIA[  ];  headaches[ N ];  stroke[ N ];  vertigo[ N ];  seizures[N  ];   paresthesias[ N ];  difficulty walking[N  ];  Psych:depression[  ]; anxiety[  ];  Endocrine: diabetes[  ];  thyroid dysfunction[  ];  Immunizations: Flu up to date [ Y ]; Pneumococcal up to date [ Y ];  Other:  Physical Exam: BP 135/71 mmHg  Pulse 77  Temp(Src) 97.5 F (36.4 C) (Oral)  Resp 20  Wt 170 lb 9.6 oz (77.384 kg)  SpO2  98%  PHYSICAL EXAMINATION: General appearance: alert, cooperative, appears stated age and no distress Head: Normocephalic, without obvious abnormality, atraumatic Neck: no adenopathy, no carotid bruit, no JVD, supple, symmetrical, trachea midline and thyroid not enlarged, symmetric, no tenderness/mass/nodules Lymph nodes: Cervical, supraclavicular, and axillary nodes normal. Resp: clear to auscultation bilaterally Back: symmetric, no curvature. ROM normal. No CVA tenderness. Cardio: regular rate and rhythm, S1, S2 normal, no murmur, click, rub or gallop GI: soft, non-tender; bowel sounds normal; no masses,  no organomegaly Extremities: extremities normal, atraumatic, no cyanosis or edema, Homans sign is negative, no sign of DVT and no edema, redness or tenderness in the calves or thighs Neurologic: Grossly normal  Palpable dt and pt pulses bilaterial    Diagnostic Studies & Laboratory data:     Recent Radiology Findings:   Ct Chest W Contrast  11/29/2014   CLINICAL DATA:  Pulmonary nodules.  EXAM: CT CHEST WITH CONTRAST  TECHNIQUE: Multidetector CT imaging of the chest was performed during intravenous contrast administration.  CONTRAST:  86m ISOVUE-300 IOPAMIDOL (ISOVUE-300) INJECTION 61%  COMPARISON:  CT scan of May 23, 2014.  FINDINGS: No pneumothorax or pleural effusion is noted. Mild emphysematous bulla are noted throughout both lungs. Stable 9 mm subpleural nodule is noted laterally in right lower lobe. The cluster of nodules and ground-glass opacities in the right upper lobe noted on prior exam now measure 24 x 21 mm which is significantly increased in size compared to prior exam. The more inferior and anterior nodule also is significantly increased in size, measuring 14 x 9 mm currently. No mediastinal mass or adenopathy is noted. Coronary artery calcifications are noted. Atherosclerosis of thoracic aorta is noted without aneurysm or dissection. Visualized portion of upper abdomen  demonstrates stable calcified splenic artery and right renal aneurysms. No significant osseous abnormality is noted.  IMPRESSION: Stable mild emphysematous changes noted throughout both lungs.  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  Stable 9 mm subpleural nodule is noted laterally in right lower lobe.  Cluster of nodules and ground-glass opacity seen in right upper lobe on prior exam has significantly enlarged in size, and PET scan is recommended to evaluate for possible neoplasm or malignancy.  These results will be called to the ordering clinician or representative by the Radiologist Assistant, and communication documented in the PACS or zVision Dashboard.   Electronically Signed   By: JMarijo Conception M.D.   On: 11/29/2014 12:17   Nm Pet Image Initial (pi) Skull Base To Thigh  12/06/2014   CLINICAL DATA:  Initial treatment strategy for enlarging right upper lobe pulmonary lesion.  EXAM: NUCLEAR MEDICINE PET SKULL BASE TO THIGH  TECHNIQUE: 8.5 mCi F-18 FDG was injected intravenously. Full-ring PET imaging was performed from the skull base to thigh after the radiotracer. CT data was obtained and used for attenuation correction and  anatomic localization.  FASTING BLOOD GLUCOSE:  Value: 108 mg/dl  COMPARISON:  Chest CTs of 05/23/2014 and 11/29/2014  FINDINGS: NECK  No areas of abnormal hypermetabolism.  CHEST  Spiculated right apical solid and sub solid pulmonary nodule is hypermetabolic. The dominant area measures 2.8 x 2.3 cm and a S.U.V. max of 5.8 on image 15 of series 8. Contiguous or satellite nodules inferiorly and anteriorly measure up to 1.0 cm and a S.U.V. max of 3.5.  No mediastinal or hilar nodal hypermetabolism.  ABDOMEN/PELVIS  No areas of abnormal hypermetabolism.  SKELETON  No abnormal marrow activity.  CT IMAGES PERFORMED FOR ATTENUATION CORRECTION  Advanced carotid atherosclerosis bilaterally. No cervical adenopathy.  Chest findings deferred to recent diagnostic CT. Mild  cardiomegaly. Multivessel coronary artery atherosclerosis. Subpleural right lower lobe 6 mm pulmonary nodule is below the resolution of PET. Centrilobular emphysema. Bilateral low-density renal lesions which are likely cysts.  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 Normal adrenal glands. Mild prostatomegaly. Small right larger than left fat containing inguinal hernias.  IMPRESSION: 1. 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 2. No evidence of thoracic nodal or extrathoracic hypermetabolic metastasis. 3. Mild cardiomegaly. Atherosclerosis, including within the coronary arteries. 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 4. Centrilobular emphysema. A right lower lobe pulmonary nodule is below the resolution of PET, but warrants followup attention.   Electronically Signed   By: Abigail Miyamoto M.D.   On: 12/06/2014 11:59     I have independently reviewed the above radiology studies  and reviewed the findings with the patient.  Pulmonary Functions Testing Results:  TLC  Date Value Ref Range Status  12/25/2014 7.39 L Final  fev1 2.55 86 %  DLCO 19.24 62%  Recent Lab Findings: Lab Results  Component Value Date   WBC 5.3 01/26/2015   HGB 13.0 01/26/2015   HCT 37.8* 01/26/2015   PLT 218 01/26/2015   GLUCOSE 100* 01/26/2015   ALT 16* 01/26/2015   AST 20 01/26/2015   NA 137 01/26/2015   K 4.9 01/26/2015   CL 107 01/26/2015   CREATININE 1.14 01/26/2015   BUN 23* 01/26/2015   CO2 20* 01/26/2015   INR 0.99 01/26/2015      Assessment / Plan:   1)  Spiculated right apical solid and sub solid pulmonary nodule is hypermetabolic.  measures 2.8 x 2.3 cm with  Contiguous or satellite nodules inferiorly and anteriorly measure up to 1.0 cm and a S.U.V. 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 Risks and options discussed with he patient including radiation therapy as treatment option . Lung resection offers best chance of cure.    2)   No mediastinal or hilar nodal hypermetabolism.  3)    Stable 9 mm subpleural nodule is noted laterally in right lower lobe. 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 The risks of the procedure including death, infection, stroke,  myocardial infarction, bleeding, blood transfusion have all been discussed specifically.  I have quoted Donald Delacruz a 3 % of perioperative mortality and a complication rate as high as 25 %. The patient's questions have been answered.Donald Delacruz is willing  to proceed with the planned procedure.   [name] MD      [address] [place] [address] Office [phone]   [place]  01/30/2015 7:15 AM

## 2015-01-31 ENCOUNTER — Inpatient Hospital Stay (HOSPITAL_COMMUNITY): Payer: Medicare Other

## 2015-01-31 ENCOUNTER — Encounter (HOSPITAL_COMMUNITY): Payer: Self-pay | Admitting: Cardiothoracic Surgery

## 2015-01-31 LAB — CBC
HCT: 31.6 % — ABNORMAL LOW (ref 39.0–52.0)
HEMOGLOBIN: 10.9 g/dL — AB (ref 13.0–17.0)
MCH: 32.3 pg (ref 26.0–34.0)
MCHC: 34.5 g/dL (ref 30.0–36.0)
MCV: 93.8 fL (ref 78.0–100.0)
Platelets: 182 10*3/uL (ref 150–400)
RBC: 3.37 MIL/uL — ABNORMAL LOW (ref 4.22–5.81)
RDW: 12.5 % (ref 11.5–15.5)
WBC: 5.7 10*3/uL (ref 4.0–10.5)

## 2015-01-31 LAB — BASIC METABOLIC PANEL
Anion gap: 6 (ref 5–15)
BUN: 12 mg/dL (ref 6–20)
CO2: 23 mmol/L (ref 22–32)
CREATININE: 0.77 mg/dL (ref 0.61–1.24)
Calcium: 8.5 mg/dL — ABNORMAL LOW (ref 8.9–10.3)
Chloride: 109 mmol/L (ref 101–111)
GFR calc non Af Amer: >60 mL/min (ref >60)
GLUCOSE: 157 mg/dL — AB (ref 65–99)
Potassium: 4.5 mmol/L (ref 3.5–5.1)
Sodium: 138 mmol/L (ref 135–145)

## 2015-01-31 LAB — POCT I-STAT 3, ART BLOOD GAS (G3+)
Acid-base deficit: 1 mmol/L (ref 0.0–2.0)
Bicarbonate: 23 mEq/L (ref 20.0–24.0)
O2 Saturation: 98 %
Patient temperature: 97.5
TCO2: 24 mmol/L (ref 0–100)
pCO2 arterial: 35.5 mmHg (ref 35.0–45.0)
pH, Arterial: 7.417 (ref 7.350–7.450)
pO2, Arterial: 107 mmHg — ABNORMAL HIGH (ref 80.0–100.0)

## 2015-01-31 MED ORDER — ENOXAPARIN SODIUM 30 MG/0.3ML ~~LOC~~ SOLN
30.0000 mg | SUBCUTANEOUS | Status: DC
  Administered 2015-01-31: 30 mg via SUBCUTANEOUS
  Filled 2015-01-31 (×2): qty 0.3

## 2015-01-31 NOTE — Anesthesia Postprocedure Evaluation (Signed)
  Anesthesia Post-op Note  Patient: Donald Delacruz  Procedure(s) Performed: Procedure(s): VIDEO BRONCHOSCOPY (N/A) Right VIDEO ASSISTED THORACOSCOPY, Right upper Lobectomy with lymph node dissection and ONQ insertion (Right)  Patient Location: PACU  Anesthesia Type:General  Level of Consciousness: awake  Airway and Oxygen Therapy: Patient Spontanous Breathing  Post-op Pain: mild  Post-op Assessment: Post-op Vital signs reviewed, Patient's Cardiovascular Status Stable, Respiratory Function Stable, Patent Airway, No signs of Nausea or vomiting and Pain level controlled              Post-op Vital Signs: Reviewed and stable  Last Vitals:  Filed Vitals:   01/31/15 1700  BP: 104/52  Pulse: 65  Temp:   Resp: 22    Complications: No apparent anesthesia complications

## 2015-01-31 NOTE — Care Management Note (Signed)
Case Management Note  Patient Details  Name: Ghali Morissette MRN: [id number] Date of Birth: April 14, 1941  Subjective/Objective:      Patient sitting up in chair.  Wife and sister in room with him.  Prior to admission lived with wife and independent.  Plan for discharge is to go home with wife who will be with him 24/7.               Action/Plan:   Expected Discharge Date:  02/06/15               Expected Discharge Plan:  Home/Self Care  In-House Referral:     Discharge planning Services     Post Acute Care Choice:    Choice offered to:     DME Arranged:    DME Agency:     HH Arranged:    HH Agency:     Status of Service:  In process, will continue to follow  Medicare Important Message Given:    Date Medicare IM Given:    Medicare IM give by:    Date Additional Medicare IM Given:    Additional Medicare Important Message give by:     If discussed at [employer] of Stay Meetings, dates discussed:    Additional Comments:  Vergie Living, RN 01/31/2015, 1:42 PM

## 2015-01-31 NOTE — Progress Notes (Signed)
Patient ID: Donald Delacruz, male   DOB: [birth year], 74 y.o.   MRN: [id number] TCTS DAILY ICU PROGRESS NOTE                   [address]            [place] [address]          [phone]   1 Day Post-Op Procedure(s) (LRB): VIDEO BRONCHOSCOPY (N/A) Right VIDEO ASSISTED THORACOSCOPY, Right upper Lobectomy with lymph node dissection and ONQ insertion (Right)  Total Length of Stay:  LOS: 1 day   Subjective: Awake and alert, good pain control per patient,   Objective: Vital signs in last 24 hours: Temp:  [96.7 F (35.9 C)-97.8 F (36.6 C)] 97.8 F (36.6 C) (08/03 0739) Pulse Rate:  [32-73] 68 (08/03 0900) Cardiac Rhythm:  [-] Normal sinus rhythm;Sinus bradycardia (08/03 0800) Resp:  [6-40] 17 (08/03 0900) BP: (96-135)/(48-71) 135/64 mmHg (08/03 0900) SpO2:  [29 %-100 %] 100 % (08/03 0900) Arterial Line BP: (103-175)/(43-70) 175/59 mmHg (08/03 0900) Weight:  [170 lb 6.7 oz (77.3 kg)] 170 lb 6.7 oz (77.3 kg) (08/02 1600)  Filed Weights   01/30/15 0617 01/30/15 1600  Weight: 170 lb 9.6 oz (77.384 kg) 170 lb 6.7 oz (77.3 kg)    Weight change: -3 oz (-0.084 kg)   Hemodynamic parameters for last 24 hours:    Intake/Output from previous day: 08/02 0701 - 08/03 0700 In: 4048 [P.O.:400; I.V.:3348; IV Piggyback:300] Out: 3140 [Urine:2410; Blood:500; Chest Tube:230]  Intake/Output this shift: Total I/O In: 510 [P.O.:360; I.V.:150] Out: 550 [Urine:550]  Current Meds: Scheduled Meds: . acetaminophen  1,000 mg Oral 4 times per day   Or  . acetaminophen (TYLENOL) oral liquid 160 mg/5 mL  1,000 mg Oral 4 times per day  . antiseptic oral rinse  7 mL Mouth Rinse BID  . aspirin EC  81 mg Oral q morning - 10a  . bisacodyl  10 mg Oral Daily  . cefUROXime (ZINACEF)  IV  1.5 g Intravenous Q12H  . diltiazem  180 mg Oral q morning - 10a  . fentaNYL   Intravenous 6 times per day  . gabapentin  600 mg Oral QHS  . lisinopril  20 mg Oral q morning - 10a  .  metoCLOPramide (REGLAN) injection  10 mg Intravenous 4 times per day  . metoprolol succinate  100 mg Oral q morning - 10a  . senna-docusate  1 tablet Oral QHS   Continuous Infusions: . bupivacaine ON-Q pain pump    . dextrose 5 % and 0.45% NaCl 75 mL/hr at 01/31/15 0400   PRN Meds:.diphenhydrAMINE **OR** diphenhydrAMINE, naloxone **AND** sodium chloride, ondansetron (ZOFRAN) IV, potassium chloride  General appearance: alert, cooperative and no distress Neurologic: intact Heart: regular rate and rhythm, S1, S2 normal, no murmur, click, rub or gallop Lungs: rhonchi RUL Abdomen: soft, non-tender; bowel sounds normal; no masses,  no organomegaly Extremities: extremities normal, atraumatic, no cyanosis or edema and Homans sign is negative, no sign of DVT Wound: 2+ air leak, 3+ with cough, leave chest tubes for now  Lab Results: CBC: Recent Labs  01/31/15 0400  WBC 5.7  HGB 10.9*  HCT 31.6*  PLT 182   BMET:  Recent Labs  01/31/15 0400  NA 138  K 4.5  CL 109  CO2 23  GLUCOSE 157*  BUN 12  CREATININE 0.77  CALCIUM 8.5*    PT/INR: No results for input(s): LABPROT, INR in the last 72 hours.  Radiology: Dg Chest Port 1 View  01/31/2015   CLINICAL DATA:  Right upper lobectomy.  EXAM: PORTABLE CHEST - 1 VIEW  COMPARISON:  01/30/2015.  FINDINGS: Right IJ line and 2 right chest tubes in stable position. Postsurgical changes right lung. Borderline cardiomegaly. No pulmonary venous congestion. No focal pulmonary infiltrate. No significant pleural effusion. Small right apical pneumothorax again noted. Right chest wall subcutaneous emphysema noted.  IMPRESSION: 1. Right IJ line and 2 right chest tubes in stable position. Small right apical pneumothorax again noted. Right chest wall subcutaneous emphysema . 2. Right upper lobectomy.   Electronically Signed   By: Marcello Moores  Register   On: 01/31/2015 07:13   Dg Chest Port 1 View  01/30/2015   CLINICAL DATA:  Status post right lobectomy and central  line placement.  EXAM: PORTABLE CHEST - 1 VIEW  COMPARISON:  01/26/2015 and previous exams.  FINDINGS: The heart size is stable. There has been interval right upper lobe resection. Two chest tubes overlie the right hemithorax, sideholes overlying the lung parenchyma. There has been interval placement of right internal jugular approach central venous catheter, with tip overlying the expected location of distal superior vena cava. There is right hilar prominence. Surgical suture lines are noted within the right hilum. There is moderate in size right pneumothorax. The left lung is clear, accounting for non visualization of the left costophrenic angle. The visualized skeletal structures are without acute abnormality.  IMPRESSION: Status post right upper lobectomy.  Moderate in size right pneumothorax, with 2 chest tubes in place.   Electronically Signed   By: Fidela Salisbury M.D.   On: 01/30/2015 14:58     Assessment/Plan: S/P Procedure(s) (LRB): VIDEO BRONCHOSCOPY (N/A) Right VIDEO ASSISTED THORACOSCOPY, Right upper Lobectomy with lymph node dissection and ONQ insertion (Right) Mobilize Diuresis Continue foley due to history of urinary problems preop, leave foley until ambulating today , likly d/c in am See progression orders Expected Acute  Blood - loss Anemia    [name] 01/31/2015 10:28 AM

## 2015-01-31 NOTE — Progress Notes (Signed)
      [address]       [place] [address]             [phone]      POD # 1 RUL  Sitting up eating dinner  BP 98/69 mmHg  Pulse 64  Temp(Src) 98.3 F (36.8 C) (Oral)  Resp 25  Ht '5\' 10"'$  (1.778 m)  Wt 170 lb 6.7 oz (77.3 kg)  BMI 24.45 kg/m2  SpO2 93%   Intake/Output Summary (Last 24 hours) at 01/31/15 1823 Last data filed at 01/31/15 1800  Gross per 24 hour  Intake 2680.5 ml  Output   3100 ml  Net -419.5 ml    Pain well controlled  Remo Lipps C. Roxan Hockey, MD [facility] [phone]

## 2015-02-01 ENCOUNTER — Inpatient Hospital Stay (HOSPITAL_COMMUNITY): Payer: Medicare Other

## 2015-02-01 LAB — COMPREHENSIVE METABOLIC PANEL
ALBUMIN: 3 g/dL — AB (ref 3.5–5.0)
ALT: 16 U/L — ABNORMAL LOW (ref 17–63)
ANION GAP: 7 (ref 5–15)
AST: 25 U/L (ref 15–41)
Alkaline Phosphatase: 33 U/L — ABNORMAL LOW (ref 38–126)
BUN: 11 mg/dL (ref 6–20)
CALCIUM: 8.6 mg/dL — AB (ref 8.9–10.3)
CO2: 26 mmol/L (ref 22–32)
Chloride: 105 mmol/L (ref 101–111)
Creatinine, Ser: 0.86 mg/dL (ref 0.61–1.24)
GFR calc Af Amer: >60 mL/min (ref >60)
GFR calc non Af Amer: >60 mL/min (ref >60)
Glucose, Bld: 106 mg/dL — ABNORMAL HIGH (ref 65–99)
POTASSIUM: 4.1 mmol/L (ref 3.5–5.1)
SODIUM: 138 mmol/L (ref 135–145)
Total Bilirubin: 0.4 mg/dL (ref 0.3–1.2)
Total Protein: 5.8 g/dL — ABNORMAL LOW (ref 6.5–8.1)

## 2015-02-01 LAB — CBC
HCT: 31.9 % — ABNORMAL LOW (ref 39.0–52.0)
HEMOGLOBIN: 10.8 g/dL — AB (ref 13.0–17.0)
MCH: 32.3 pg (ref 26.0–34.0)
MCHC: 33.9 g/dL (ref 30.0–36.0)
MCV: 95.5 fL (ref 78.0–100.0)
Platelets: 192 10*3/uL (ref 150–400)
RBC: 3.34 MIL/uL — AB (ref 4.22–5.81)
RDW: 12.9 % (ref 11.5–15.5)
WBC: 7.6 10*3/uL (ref 4.0–10.5)

## 2015-02-01 MED ORDER — ENOXAPARIN SODIUM 40 MG/0.4ML ~~LOC~~ SOLN
40.0000 mg | SUBCUTANEOUS | Status: DC
  Administered 2015-02-01 – 2015-02-06 (×5): 40 mg via SUBCUTANEOUS
  Filled 2015-02-01 (×7): qty 0.4

## 2015-02-01 NOTE — Progress Notes (Signed)
Patient ID: Estiven Kohan, male   DOB: 09-Jan-1941, 74 y.o.   MRN: [id number] EVENING ROUNDS NOTE :     [address]       [place] [address]             [phone]                 2 Days Post-Op Procedure(s) (LRB): VIDEO BRONCHOSCOPY (N/A) Right VIDEO ASSISTED THORACOSCOPY, Right upper Lobectomy with lymph node dissection and ONQ insertion (Right)  Total Length of Stay:  LOS: 2 days  BP 87/60 mmHg  Pulse 83  Temp(Src) 98.3 F (36.8 C) (Oral)  Resp 19  Ht '5\' 10"'$  (1.778 m)  Wt 170 lb 6.7 oz (77.3 kg)  BMI 24.45 kg/m2  SpO2 98%  .Intake/Output      08/04 0701 - 08/05 0700   P.O.    I.V. (mL/kg) 116 (1.5)   IV Piggyback    Total Intake(mL/kg) 116 (1.5)   Urine (mL/kg/hr) 225 (0.2)   Chest Tube 100 (0.1)   Total Output 325   Net -209         . bupivacaine ON-Q pain pump    . dextrose 5 % and 0.45% NaCl 10 mL/hr at 02/01/15 1300     Lab Results  Component Value Date   WBC 7.6 02/01/2015   HGB 10.8* 02/01/2015   HCT 31.9* 02/01/2015   PLT 192 02/01/2015   GLUCOSE 106* 02/01/2015   ALT 16* 02/01/2015   AST 25 02/01/2015   NA 138 02/01/2015   K 4.1 02/01/2015   CL 105 02/01/2015   CREATININE 0.86 02/01/2015   BUN 11 02/01/2015   CO2 26 02/01/2015   INR 0.99 01/26/2015   Waiting for bed out of unit Path still pending   [name] MD  [address] [phone] Office [phone] 02/01/2015 7:57 PM

## 2015-02-01 NOTE — Progress Notes (Signed)
Patient ID: Donald Delacruz, male   DOB: 25-Sep-1940, 74 y.o.   MRN: [id number] [facility] DAILY ICU PROGRESS NOTE                   [address]            [place] [address]          [phone]   2 Days Post-Op Procedure(s) (LRB): VIDEO BRONCHOSCOPY (N/A) Right VIDEO ASSISTED THORACOSCOPY, Right upper Lobectomy with lymph node dissection and ONQ insertion (Right)  Total Length of Stay:  LOS: 2 days   Subjective: Feels well, good respiratory effort  Objective: Vital signs in last 24 hours: Temp:  [97.4 F (36.3 C)-98.5 F (36.9 C)] 98.5 F (36.9 C) (08/04 0719) Pulse Rate:  [64-87] 79 (08/04 0700) Cardiac Rhythm:  [-] Normal sinus rhythm (08/04 0400) Resp:  [15-29] 18 (08/04 0700) BP: (87-143)/(49-77) 125/66 mmHg (08/04 0700) SpO2:  [93 %-100 %] 98 % (08/04 0700) Arterial Line BP: (171-175)/(58-59) 171/58 mmHg (08/03 1000)  Filed Weights   01/30/15 0617 01/30/15 1600  Weight: 170 lb 9.6 oz (77.384 kg) 170 lb 6.7 oz (77.3 kg)    Weight change:    Hemodynamic parameters for last 24 hours:    Intake/Output from previous day: 08/03 0701 - 08/04 0700 In: 2316.5 [P.O.:1029; I.V.:1237.5; IV Piggyback:50] Out: 9935 [Urine:3345; Chest Tube:200]  Intake/Output this shift:    Current Meds: Scheduled Meds: . acetaminophen  1,000 mg Oral 4 times per day   Or  . acetaminophen (TYLENOL) oral liquid 160 mg/5 mL  1,000 mg Oral 4 times per day  . antiseptic oral rinse  7 mL Mouth Rinse BID  . aspirin EC  81 mg Oral q morning - 10a  . bisacodyl  10 mg Oral Daily  . diltiazem  180 mg Oral q morning - 10a  . enoxaparin (LOVENOX) injection  40 mg Subcutaneous Q24H  . fentaNYL   Intravenous 6 times per day  . gabapentin  600 mg Oral QHS  . lisinopril  20 mg Oral q morning - 10a  . metoprolol succinate  100 mg Oral q morning - 10a  . senna-docusate  1 tablet Oral QHS   Continuous Infusions: . bupivacaine ON-Q pain pump    . dextrose 5 % and 0.45% NaCl 50 mL/hr at  02/01/15 0400   PRN Meds:.diphenhydrAMINE **OR** diphenhydrAMINE, naloxone **AND** sodium chloride, ondansetron (ZOFRAN) IV, potassium chloride  General appearance: alert and cooperative Neurologic: intact Heart: regular rate and rhythm, S1, S2 normal, no murmur, click, rub or gallop Lungs: diminished breath sounds bibasilar Abdomen: soft, non-tender; bowel sounds normal; no masses,  no organomegaly Extremities: extremities normal, atraumatic, no cyanosis or edema and Homans sign is negative, no sign of DVT Wound: decreased air leak this am, will leave tubes in   Lab Results: CBC: Recent Labs  01/31/15 0400 02/01/15 0415  WBC 5.7 7.6  HGB 10.9* 10.8*  HCT 31.6* 31.9*  PLT 182 192   BMET:  Recent Labs  01/31/15 0400 02/01/15 0415  NA 138 138  K 4.5 4.1  CL 109 105  CO2 23 26  GLUCOSE 157* 106*  BUN 12 11  CREATININE 0.77 0.86  CALCIUM 8.5* 8.6*    PT/INR: No results for input(s): LABPROT, INR in the last 72 hours. Radiology: Dg Chest Port 1 View  02/01/2015   CLINICAL DATA:  Status post right upper lobectomy with lymph node dissection  EXAM: PORTABLE CHEST - 1 VIEW  COMPARISON:  Portable chest x-ray of January 31, 2015  FINDINGS: There has been reexpansion of the right lung. No visible pneumothorax is demonstrated today. The 2 right-sided chest tubes are unchanged in position. There remains right axillary subcutaneous emphysema. The left lung is well-expanded and clear. There is minimal shift of the mediastinum toward the right which is stable. The right internal jugular venous catheter tip projects over the proximal SVC. The heart is normal in size. There is persistent right hilar prominence. The pulmonary vascularity is not engorged. No acute bony abnormality is demonstrated.  IMPRESSION: Interval re-expansion of the right lung. The support tubes are in stable position. There is no pneumonia nor CHF.   Electronically Signed   By: David  Martinique M.D.   On: 02/01/2015 07:22      Assessment/Plan: S/P Procedure(s) (LRB): VIDEO BRONCHOSCOPY (N/A) Right VIDEO ASSISTED THORACOSCOPY, Right upper Lobectomy with lymph node dissection and ONQ insertion (Right) Mobilize Plan for transfer to step-down: see transfer orders See progression orders d/c foley Continue chest tubes   Donald Delacruz 02/01/2015 8:10 AM

## 2015-02-02 ENCOUNTER — Inpatient Hospital Stay (HOSPITAL_COMMUNITY): Payer: Medicare Other

## 2015-02-02 DIAGNOSIS — C3410 Malignant neoplasm of upper lobe, unspecified bronchus or lung: Secondary | ICD-10-CM | POA: Diagnosis present

## 2015-02-02 LAB — GLUCOSE, CAPILLARY
Glucose-Capillary: 137 mg/dL — ABNORMAL HIGH (ref 65–99)
Glucose-Capillary: 112 mg/dL — ABNORMAL HIGH (ref 65–99)
Glucose-Capillary: 129 mg/dL — ABNORMAL HIGH (ref 65–99)

## 2015-02-02 LAB — CBC
HCT: 30.2 % — ABNORMAL LOW (ref 39.0–52.0)
Hemoglobin: 10.2 g/dL — ABNORMAL LOW (ref 13.0–17.0)
MCH: 31.9 pg (ref 26.0–34.0)
MCHC: 33.8 g/dL (ref 30.0–36.0)
MCV: 94.4 fL (ref 78.0–100.0)
Platelets: 185 10*3/uL (ref 150–400)
RBC: 3.2 MIL/uL — ABNORMAL LOW (ref 4.22–5.81)
RDW: 12.6 % (ref 11.5–15.5)
WBC: 7.4 10*3/uL (ref 4.0–10.5)

## 2015-02-02 LAB — BASIC METABOLIC PANEL
Anion gap: 9 (ref 5–15)
BUN: 12 mg/dL (ref 6–20)
CO2: 25 mmol/L (ref 22–32)
Calcium: 8.6 mg/dL — ABNORMAL LOW (ref 8.9–10.3)
Chloride: 103 mmol/L (ref 101–111)
Creatinine, Ser: 1.01 mg/dL (ref 0.61–1.24)
GFR calc Af Amer: >60 mL/min (ref >60)
GFR calc non Af Amer: >60 mL/min (ref >60)
Glucose, Bld: 109 mg/dL — ABNORMAL HIGH (ref 65–99)
Potassium: 4.1 mmol/L (ref 3.5–5.1)
Sodium: 137 mmol/L (ref 135–145)

## 2015-02-02 MED ORDER — TRAMADOL HCL 50 MG PO TABS
50.0000 mg | ORAL_TABLET | Freq: Four times a day (QID) | ORAL | Status: DC | PRN
Start: 2015-02-02 — End: 2015-02-07
  Administered 2015-02-05 – 2015-02-07 (×5): 50 mg via ORAL
  Filled 2015-02-02 (×5): qty 1

## 2015-02-02 MED ORDER — OXYCODONE HCL 5 MG PO TABS
5.0000 mg | ORAL_TABLET | ORAL | Status: DC | PRN
  Administered 2015-02-02 – 2015-02-07 (×8): 5 mg via ORAL
  Filled 2015-02-02 (×9): qty 1

## 2015-02-02 NOTE — Care Management Important Message (Signed)
Important Message  Patient Details  Name: Pepe Mineau MRN: [id number] Date of Birth: Apr 10, 1941   Medicare Important Message Given:  [facility] notification given    Nathen May 02/02/2015, 11:30 AMImportant Message  Patient Details  Name: Ayomide Purdy MRN: [id number] Date of Birth: August 11, 1940   Medicare Important Message Given:  Yes-second notification given    Nathen May 02/02/2015, 11:30 AM

## 2015-02-02 NOTE — Progress Notes (Addendum)
[address]       [place] [address]             [phone]          3 Days Post-Op Procedure(s) (LRB): VIDEO BRONCHOSCOPY (N/A) Right VIDEO ASSISTED THORACOSCOPY, Right upper Lobectomy with lymph node dissection and ONQ insertion (Right)  Subjective: Comfortable, no complaints.    Objective: Vital signs in last 24 hours: Patient Vitals for the past 24 hrs:  BP Temp Temp src Pulse Resp SpO2  02/02/15 0800 - 97.7 F (36.5 C) Oral - - -  02/02/15 0405 126/62 mmHg 97.9 F (36.6 C) Oral 81 20 94 %  02/02/15 0400 - - - - (!) 21 95 %  02/02/15 0000 - - - - 17 98 %  02/01/15 2310 (!) 146/68 mmHg 99.1 F (37.3 C) Axillary 86 17 95 %  02/01/15 2100 129/66 mmHg - - 78 19 97 %  02/01/15 2000 (!) 110/57 mmHg - - 78 (!) 22 99 %  02/01/15 1943 - 98.3 F (36.8 C) Oral - - -  02/01/15 1900 (!) 87/60 mmHg - - - 19 -  02/01/15 1800 (!) 118/57 mmHg - - - (!) 23 -  02/01/15 1700 114/62 mmHg - - - 20 -  02/01/15 1600 (!) 119/59 mmHg - - - 17 -  02/01/15 1522 - 98.4 F (36.9 C) Oral - - -  02/01/15 1500 (!) 116/54 mmHg - - - (!) 22 -  02/01/15 1400 108/67 mmHg - - - (!) 26 -  02/01/15 1300 132/64 mmHg - - - (!) 22 -  02/01/15 1200 140/66 mmHg - - - 20 98 %  02/01/15 1152 - 98.4 F (36.9 C) Oral - - -  02/01/15 1100 (!) 136/58 mmHg - - - (!) 24 -  02/01/15 1000 - - - - (!) 24 -  02/01/15 0900 (!) 145/99 mmHg - - - (!) 21 -   Current Weight  01/30/15 170 lb 6.7 oz (77.3 kg)     Intake/Output from previous day: 08/04 0701 - 08/05 0700 In: 526 [P.O.:360; I.V.:166] Out: 1045 [Urine:825; Chest Tube:220]    PHYSICAL EXAM:  Heart: RRR Lungs: Few coarse BS that clear with cough Wound: Clean and dry Chest tube: 1/7 air leak    Lab Results: CBC: Recent Labs  02/01/15 0415 02/02/15 0600  WBC 7.6 7.4  HGB 10.8* 10.2*  HCT 31.9* 30.2*  PLT 192 185   BMET:  Recent Labs  02/01/15 0415 02/02/15 0600  NA 138 137  K 4.1 4.1  CL 105 103  CO2  26 25  GLUCOSE 106* 109*  BUN 11 12  CREATININE 0.86 1.01  CALCIUM 8.6* 8.6*    PT/INR: No results for input(s): LABPROT, INR in the last 72 hours.  CXR: FINDINGS: Two right chest tubes and right IJ line in stable position. Postsurgical changes right lung. Left lung clear. Heart size normal. No acute bony abnormality. Right chest wall subcutaneous emphysema.  IMPRESSION: 1. Right IJ line and right chest tubes in stable position. No pneumothorax. Mild right chest wall subcutaneous emphysema. 2. Postsurgical changes right lung.  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  Possibly could d/c 1 CT today vs place CTs to water seal. Pulm- sats stable,  off O2.  Continue IS/pulm toilet. D/c On-Q, ambulate in halls.   LOS: 3 days    COLLINS,GINA H 02/02/2015  Air leak decreased , only from posterior chest tube, will d/c anterior chest tube and put to water seal Fu chest xray in am  Final path: Lung cancer, right upper lobe   Staging form: Lung, AJCC 7th Edition     Clinical stage from 01/08/2015: Stage IIB (T3(2), N0, M0) - Signed by [name], MD on 02/02/2015     Pathologic stage from 02/02/2015: Stage IB (T2a, N0, cM0) - Signed by [name], MD on 02/02/2015 Discussed with pathology, careful exam of mass appears as one multilobular mass rather then two separate masses in same lobe suggested on CT. No nodal involvement  I have seen and examined Donald Delacruz and agree with the above assessment  and plan.  [name] MD [facility] [phone] Office [phone] 02/02/2015 10:06 AM

## 2015-02-02 NOTE — Progress Notes (Signed)
1435, Anterior CT, IJ, and On-q d/c per MD order, pt tol well, pt verbalized understanding to N/Nursing if Chest tightness, SOB, or discomfort, all questions answered

## 2015-02-02 NOTE — Discharge Summary (Signed)
[address]       [place] [address]             [phone]              Discharge Summary  Name: Donald Delacruz DOB: 1940-10-19 74 y.o. 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 Essential hypertension   . ED (erectile dysfunction)   . Insomnia   . Lipoma   . Osteoarthritis   . Emphysema lung   . Restless legs syndrome   . Coronary artery calcification seen on computed tomography June 2016    CT scan of chest: Coronary artery calcifications are noted  . Thoracic aortic atherosclerosis June 2016    CT scan chest: Atherosclerosis of thoracic aorta is noted without aneurysm or dissection. Visualized portion of upper abdomen demonstrates stable calcified splenic artery and right renal  . 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 male with a remote smoking history who was noted on routine chest x-ray in late 2015 to have a questionable right upper lobe nodule.  This was confirmed by CT scan. A follow up CT in June 2016 showed enlargement of the lesion to 24 x 21 mm, suspicious for malignancy.  A PET scan was performed which showed increased uptake in the right upper lobe lesion with SUV 5.8.  The patient was referred to Dr. Servando Snare for thoracic surgical consultation. He recommended proceeding with surgical resection, however, the patient was initially resistant.  Therefore, a CT guided needle biopsy was performed, which was positive for adenocarcinoma. At that point, the patient agreed to proceed with  surgery. All risks, benefits and alternatives of surgery were explained in detail, and the patient agreed to proceed.   Hospital Course:  The patient was admitted to [facility] on 01/30/2015. The patient was taken to the operating room and underwent the above procedure.    The postoperative course has generally been uneventful.  Final pathology revealed invasive moderately to poorly differentiated adenocarcinoma (T2a, N0).  He initially had a small air leak and chest tubes were continued to suction.  They were weaned off suction and placed on water seal on POD #3.  His anterior chest tube was removed that day as well.  His chest tubes remained off suction.  However, he continued to have a small air leak and tidaling present.  His chest tube was monitored over the weekend.  CXR showed a stable appearance of a right sided apical pneumothorax.  It was felt his chest tube could be safely removed on POD #7.  Follow up CXR continues to show stable appearance of a right apical pneumothorax.  He is medically stable.  He is off oxygen and will continue to use his incentive spirometer at discharge.  He is ambulating without difficulty.  He is tolerating a diet.  He is felt medically stable for discharge home today 02/07/2015.  Recent vital signs:  Filed Vitals:   02/07/15 0415  BP: 110/54  Pulse: 87  Temp:   Resp: 16  Recent laboratory studies:  CBC:No results for input(s): WBC, HGB, HCT, PLT in the last 72 hours. BMET: No results for input(s): NA, K, CL, CO2, GLUCOSE, BUN, CREATININE, CALCIUM in the last 72 hours.  PT/INR: No results for input(s): LABPROT, INR in the last 72 hours.   Discharge Medications:     Medication List    TAKE these medications        acetaminophen 500 MG tablet  Commonly known as:  TYLENOL  Take 1,000 mg by mouth every 4 (four) hours as needed for moderate pain or headache.     aspirin EC 81 MG tablet  Take 81 mg by mouth every morning.     CALCIUM 600+D 600-400  MG-UNIT per tablet  Generic drug:  Calcium Carbonate-Vitamin D  Take 1 tablet by mouth every morning.     clobetasol ointment 0.05 %  Commonly known as:  TEMOVATE  Clobetasol Propionate 0.05 % External Ointment Apply to rash, and rub in well, twice a day as needed.  Quantity: 1;  Refills: 2   Wilson M.D., [employer] ;  Start 24-Apr-2010 Active 30 GM Tube     diltiazem 180 MG 24 hr capsule  Commonly known as:  CARDIZEM CD  Take 180 mg by mouth every morning.     Fish Oil 1000 MG Caps  Take 1,000 mg by mouth daily.     gabapentin 300 MG capsule  Commonly known as:  NEURONTIN  Take 300-600 mg by mouth at bedtime.     LEVITRA 20 MG tablet  Generic drug:  vardenafil  Take 20 mg by mouth daily as needed for erectile dysfunction.     lisinopril 20 MG tablet  Commonly known as:  PRINIVIL,ZESTRIL  Take 20 mg by mouth every morning.     meloxicam 15 MG tablet  Commonly known as:  MOBIC  Take 15 mg by mouth daily.     metoprolol succinate 100 MG 24 hr tablet  Commonly known as:  TOPROL-XL  Take 100 mg by mouth every morning.     multivitamin capsule  Take 1 capsule by mouth every morning.     oxyCODONE 5 MG immediate release tablet  Commonly known as:  Oxy IR/ROXICODONE  Take 1 tablet (5 mg total) by mouth every 4 (four) hours as needed for severe pain.     Turmeric 450 MG Caps  Take 500 mg by mouth every morning.     VIAGRA 100 MG tablet  Generic drug:  sildenafil  Take 100 mg by mouth daily as needed for erectile dysfunction.         Discharge Instructions:  The patient is to refrain from driving, heavy lifting or strenuous activity.  May shower daily and clean incisions with soap and water.  May resume regular diet.   Follow Up: Follow-up Information    Follow up with [name], MD On 02/22/2015.   Specialty:  Cardiothoracic Surgery   Why:  Have a chest x-ray at [facility] at 1:30, then see MD at 2:30   Contact information:   [address] [place] [address] [phone]       Follow up with [facility] RN On 02/09/2015.   Why:  For suture removal at 9:30      Follow up with [name], MD In 1 week.   Specialty:  Cardiothoracic Surgery   Why:  Office will contact you with appointment date and time.  Please get CXR 30 min  prior to appointment   Contact information:   [address] [place] [address] [phone]         Follow-up Information    Follow up with [name], MD On 02/22/2015.   Specialty:  Cardiothoracic Surgery   Why:  Have a chest x-ray at [facility] at 1:30, then see MD at 2:30   Contact information:   [address] [place] [address] [phone]       Follow up with [facility] RN On 02/09/2015.   Why:  For suture removal at 9:30      Follow up with [name], MD In 1 week.   Specialty:  Cardiothoracic Surgery   Why:  Office will contact you with appointment date and time.  Please get CXR 30 min prior to appointment   Contact information:   [address] [place] [address] [phone]        Yi Haugan, [place] 02/07/2015, 8:12 AM

## 2015-02-03 ENCOUNTER — Inpatient Hospital Stay (HOSPITAL_COMMUNITY): Payer: Medicare Other

## 2015-02-03 LAB — BASIC METABOLIC PANEL
Anion gap: 8 (ref 5–15)
BUN: 21 mg/dL — ABNORMAL HIGH (ref 6–20)
CO2: 26 mmol/L (ref 22–32)
Calcium: 8.6 mg/dL — ABNORMAL LOW (ref 8.9–10.3)
Chloride: 103 mmol/L (ref 101–111)
Creatinine, Ser: 1.25 mg/dL — ABNORMAL HIGH (ref 0.61–1.24)
GFR calc Af Amer: >60 mL/min (ref >60)
GFR calc non Af Amer: 55 mL/min — ABNORMAL LOW (ref >60)
Glucose, Bld: 105 mg/dL — ABNORMAL HIGH (ref 65–99)
Potassium: 4.4 mmol/L (ref 3.5–5.1)
Sodium: 137 mmol/L (ref 135–145)

## 2015-02-03 LAB — CBC
HCT: 29.4 % — ABNORMAL LOW (ref 39.0–52.0)
Hemoglobin: 9.8 g/dL — ABNORMAL LOW (ref 13.0–17.0)
MCH: 31.9 pg (ref 26.0–34.0)
MCHC: 33.3 g/dL (ref 30.0–36.0)
MCV: 95.8 fL (ref 78.0–100.0)
Platelets: 189 10*3/uL (ref 150–400)
RBC: 3.07 MIL/uL — ABNORMAL LOW (ref 4.22–5.81)
RDW: 12.8 % (ref 11.5–15.5)
WBC: 7 10*3/uL (ref 4.0–10.5)

## 2015-02-03 LAB — GLUCOSE, CAPILLARY: Glucose-Capillary: 135 mg/dL — ABNORMAL HIGH (ref 65–99)

## 2015-02-03 NOTE — Progress Notes (Addendum)
Wasted 350 mcg IV fentanyl with Allegra Lai, RN.  Co-signed by Allegra Lai, RN

## 2015-02-03 NOTE — Progress Notes (Signed)
UR COMPLETED  

## 2015-02-03 NOTE — Progress Notes (Addendum)
      [address]       [place] [address]             [phone]      4 Days Post-Op Procedure(s) (LRB): VIDEO BRONCHOSCOPY (N/A) Right VIDEO ASSISTED THORACOSCOPY, Right upper Lobectomy with lymph node dissection and ONQ insertion (Right)   Subjective:  Mr. Space has no complaints.  He looks great  Objective: Vital signs in last 24 hours: Temp:  [97.8 F (36.6 C)-98.5 F (36.9 C)] 98 F (36.7 C) (08/06 0731) Pulse Rate:  [78-87] 87 (08/06 0954) Cardiac Rhythm:  [-] Normal sinus rhythm (08/06 0815) Resp:  [15-29] 20 (08/06 0731) BP: (96-128)/(56-71) 108/56 mmHg (08/06 0954) SpO2:  [97 %-99 %] 97 % (08/06 0731)  Intake/Output from previous day: 08/05 0701 - 08/06 0700 In: 720 [P.O.:720] Out: 630 [Urine:500; Chest Tube:130] Intake/Output this shift: Total I/O In: 240 [P.O.:240] Out: 530 [Urine:500; Chest Tube:30]  General appearance: alert, cooperative and no distress Heart: regular rate and rhythm Lungs: clear to auscultation bilaterally Abdomen: soft, non-tender; bowel sounds normal; no masses,  no organomegaly Extremities: extremities normal, atraumatic, no cyanosis or edema Wound: clean and dry  Lab Results:  Recent Labs  02/02/15 0600 02/03/15 0236  WBC 7.4 7.0  HGB 10.2* 9.8*  HCT 30.2* 29.4*  PLT 185 189   BMET:  Recent Labs  02/02/15 0600 02/03/15 0236  NA 137 137  K 4.1 4.4  CL 103 103  CO2 25 26  GLUCOSE 109* 105*  BUN 12 21*  CREATININE 1.01 1.25*  CALCIUM 8.6* 8.6*    PT/INR: No results for input(s): LABPROT, INR in the last 72 hours. ABG    Component Value Date/Time   PHART 7.417 01/31/2015 0355   HCO3 23.0 01/31/2015 0355   TCO2 24 01/31/2015 0355   ACIDBASEDEF 1.0 01/31/2015 0355   O2SAT 98.0 01/31/2015 0355   CBG (last 3)   Recent Labs  02/02/15 1150 02/02/15 1650 02/02/15 2348  GLUCAP 112* 129* 135*    Assessment/Plan: S/P Procedure(s) (LRB): VIDEO BRONCHOSCOPY (N/A) Right VIDEO ASSISTED  THORACOSCOPY, Right upper Lobectomy with lymph node dissection and ONQ insertion (Right)  1. Chest tube- no air leak appreciated 150 cc output, CXR free from pneumothorax- will leave in place today 2. Pulm- no acute issues, off oxygen, continue IS 3. CV- HTN, stable on home medications 4. Dispo- patient stable, likely d/c chest tube tomorrow   LOS: 4 days    Ellwood Handler 02/03/2015   Chart reviewed, patient examined, agree with above. CXR looks ok. There is no definite air leak but a lot of tidaling so when he coughs there is some air getting into bubble chamber. Will keep CXR on water seal today and repeat CXR in the am. If no ptx will remove the tube. Then plan home Monday if CXR ok.

## 2015-02-04 ENCOUNTER — Inpatient Hospital Stay (HOSPITAL_COMMUNITY): Payer: Medicare Other

## 2015-02-04 NOTE — Progress Notes (Signed)
      [address]       [place] [address]             [phone]      5 Days Post-Op Procedure(s) (LRB): VIDEO BRONCHOSCOPY (N/A) Right VIDEO ASSISTED THORACOSCOPY, Right upper Lobectomy with lymph node dissection and ONQ insertion (Right)   Subjective:  [name] has no new complaints.  Family at bedside, all questions addressed.  Objective: Vital signs in last 24 hours: Temp:  [97.6 F (36.4 C)-98.6 F (37 C)] 97.8 F (36.6 C) (08/07 1155) Pulse Rate:  [77-99] 99 (08/07 0745) Cardiac Rhythm:  [-] Normal sinus rhythm (08/07 0745) Resp:  [16-21] 16 (08/07 0745) BP: (119-138)/(54-64) 127/59 mmHg (08/07 0745) SpO2:  [96 %-98 %] 97 % (08/07 0745)  Intake/Output from previous day: 08/06 0701 - 08/07 0700 In: 1320 [P.O.:1320] Out: 2645 [Urine:2575; Chest Tube:70] Intake/Output this shift: Total I/O In: 240 [P.O.:240] Out: 0   General appearance: alert and cooperative Heart: regular rate and rhythm Lungs: clear to auscultation bilaterally Abdomen: soft, non-tender; bowel sounds normal; no masses,  no organomegaly Extremities: extremities normal, atraumatic, no cyanosis or edema Wound: clean and dry  Lab Results:  Recent Labs  02/02/15 0600 02/03/15 0236  WBC 7.4 7.0  HGB 10.2* 9.8*  HCT 30.2* 29.4*  PLT 185 189   BMET:  Recent Labs  02/02/15 0600 02/03/15 0236  NA 137 137  K 4.1 4.4  CL 103 103  CO2 25 26  GLUCOSE 109* 105*  BUN 12 21*  CREATININE 1.01 1.25*  CALCIUM 8.6* 8.6*    PT/INR: No results for input(s): LABPROT, INR in the last 72 hours. ABG    Component Value Date/Time   PHART 7.417 01/31/2015 0355   HCO3 23.0 01/31/2015 0355   TCO2 24 01/31/2015 0355   ACIDBASEDEF 1.0 01/31/2015 0355   O2SAT 98.0 01/31/2015 0355   CBG (last 3)   Recent Labs  02/02/15 1150 02/02/15 1650 02/02/15 2348  GLUCAP 112* 129* 135*    Assessment/Plan: S/P Procedure(s) (LRB): VIDEO BRONCHOSCOPY (N/A) Right VIDEO ASSISTED  THORACOSCOPY, Right upper Lobectomy with lymph node dissection and ONQ insertion (Right)  1. Chest tube- no air leak appreciated, significant tidaling present, CXR with right sided pneumothorax, + sub q air 2. Pulm- no acute issues, continue IS 3. CV- HTN stable 4. Dispo- asked nursing to redress chest tube site and place Xeroform around chest tube, with new pneumothorax will leave chest tube to water seal today, repeat CXR in AM   LOS: 5 days    BARRETT, ERIN 02/04/2015

## 2015-02-04 NOTE — Discharge Instructions (Signed)
Video-Assisted Thoracic Surgery °Care After °Refer to this sheet in the next few weeks. These instructions provide you with information on caring for yourself after your procedure. Your caregiver may also give you more specific instructions. Your procedure has been planned according to current medical practices, but problems sometimes occur. Call your caregiver if you have any problems or questions after your procedure. °HOME CARE INSTRUCTIONS  °· Only take over-the-counter or prescription medications as directed. °· Only take pain medications (narcotics) as directed. °· Do not drive until your caregiver approves. Driving while taking narcotics or soon after surgery can be dangerous, so discuss the specific timing with your caregiver. °· Avoid activities that use your chest muscles, such as lifting heavy objects, for at least 3-4 weeks.   °· Take deep breaths to expand the lungs and to protect against pneumonia. °· Do breathing exercises as directed by your caregiver. If you were given an incentive spirometer to help with breathing, use it as directed. °· You may resume a normal diet and activities when you feel you are able to or as directed. °· Do not take a bath until your caregiver says it is OK. Use the shower instead.   °· Keep the bandage (dressing) covering the area where the chest tube was inserted (incision site) dry for 48 hours. After 48 hours, remove the dressing unless there is new drainage. °· Remove dressings as directed by your caregiver. °· Change dressings if necessary or as directed. °· Keep all follow-up appointments. It is important for you to see your caregiver after surgery to discuss appropriate follow-up care and surveillance, if it is necessary. °SEEK MEDICAL CARE: °· You feel excessive or increasing pain at an incision site. °· You notice bleeding, skin irritation, drainage, swelling, or redness at an incision site. °· There is a bad smell coming from an incision or dressing. °· It feels  like your heart is fluttering or beating rapidly. °· Your pain medication does not relieve your pain. °SEEK IMMEDIATE MEDICAL CARE IF:  °· You have a fever.   °· You have chest pain.  °· You have a rash. °· You have shortness of breath. °· You have trouble breathing.   °· You feel weak, lightheaded, dizzy, or faint.   °MAKE SURE YOU:  °· Understand these instructions.   °· Will watch your condition.   °· Will get help right away if you are not doing well or get worse. °Document Released: 10/11/2012 Document Reviewed: 10/11/2012 °ExitCare® Patient Information ©2015 [employer]. This information is not intended to replace advice given to you by your health care provider. Make sure you discuss any questions you have with your health care provider. ° °

## 2015-02-05 ENCOUNTER — Inpatient Hospital Stay (HOSPITAL_COMMUNITY): Payer: Medicare Other

## 2015-02-05 NOTE — Care Management Important Message (Signed)
Important Message  Patient Details  Name: Donald Delacruz MRN: [id number] Date of Birth: May 18, 1941   Medicare Important Message Given:  Yes-third notification given    Delorse Lek 02/05/2015, 4:08 PM

## 2015-02-05 NOTE — Progress Notes (Addendum)
      [address]       [place] [address]             [phone]      6 Days Post-Op Procedure(s) (LRB): VIDEO BRONCHOSCOPY (N/A) Right VIDEO ASSISTED THORACOSCOPY, Right upper Lobectomy with lymph node dissection and ONQ insertion (Right)   Subjective:  Donald Delacruz has no complaints this morning.  Anxious to get home  Objective: Vital signs in last 24 hours: Temp:  [97.7 F (36.5 C)-99.8 F (37.7 C)] 97.7 F (36.5 C) (08/08 0747) Pulse Rate:  [93-101] 100 (08/08 0747) Cardiac Rhythm:  [-] Normal sinus rhythm (08/07 2000) Resp:  [16-24] 21 (08/08 0747) BP: (112-148)/(53-72) 127/67 mmHg (08/08 0747) SpO2:  [94 %-98 %] 96 % (08/08 0747)  Intake/Output from previous day: 08/07 0701 - 08/08 0700 In: 70 [P.O.:960] Out: 1000 [Urine:1000] Intake/Output this shift: Total I/O In: -  Out: 250 [Urine:250]  General appearance: alert, cooperative and no distress Heart: regular rate and rhythm Lungs: clear to auscultation bilaterally Abdomen: soft, non-tender; bowel sounds normal; no masses,  no organomegaly Extremities: extremities normal, atraumatic, no cyanosis or edema Wound: clean and dry  Lab Results:  Recent Labs  02/03/15 0236  WBC 7.0  HGB 9.8*  HCT 29.4*  PLT 189   BMET:  Recent Labs  02/03/15 0236  NA 137  K 4.4  CL 103  CO2 26  GLUCOSE 105*  BUN 21*  CREATININE 1.25*  CALCIUM 8.6*    PT/INR: No results for input(s): LABPROT, INR in the last 72 hours. ABG    Component Value Date/Time   PHART 7.417 01/31/2015 0355   HCO3 23.0 01/31/2015 0355   TCO2 24 01/31/2015 0355   ACIDBASEDEF 1.0 01/31/2015 0355   O2SAT 98.0 01/31/2015 0355   CBG (last 3)   Recent Labs  02/02/15 1150 02/02/15 1650 02/02/15 2348  GLUCAP 112* 129* 135*    Assessment/Plan: S/P Procedure(s) (LRB): VIDEO BRONCHOSCOPY (N/A) Right VIDEO ASSISTED THORACOSCOPY, Right upper Lobectomy with lymph node dissection and ONQ insertion (Right)  1. Chest  tube- + tidaling with cough, likely small air leak, however not definitely seen- CXR remains stable with right pneumothorax 2. Pulm- no acute issues, off oxygen, continue IS 3. CV- HTN stable 4. Dispo- chest tube with ? Air leak on water seal, CXR with stable pneumothorax, management per staff   LOS: 6 days    Donald Delacruz 02/05/2015  No air leak this evening, plan chest xray in am , d/c chest tube and d/c Wednesday I have seen and examined Donald Delacruz and agree with the above assessment  and plan.  [name] MD Beeper [phone] Office [phone] 02/05/2015 5:54 PM

## 2015-02-06 ENCOUNTER — Inpatient Hospital Stay (HOSPITAL_COMMUNITY): Payer: Medicare Other

## 2015-02-06 MED ORDER — MELOXICAM 15 MG PO TABS
15.0000 mg | ORAL_TABLET | Freq: Every day | ORAL | Status: DC
  Administered 2015-02-06 – 2015-02-07 (×2): 15 mg via ORAL
  Filled 2015-02-06 (×2): qty 1

## 2015-02-06 NOTE — Care Management Note (Signed)
Case Management Note  Patient Details  Name: Onur Mori MRN: [id number] Date of Birth: 10-12-1940     IIf discussed at [place] Length of Stay Meetings, dates discussed:  02/06/2015  Additional Comments:  Sharin Mons, RN 02/06/2015, 8:53 AM

## 2015-02-06 NOTE — Progress Notes (Addendum)
      [address]       [place], [address]             [phone]      7 Days Post-Op Procedure(s) (LRB): VIDEO BRONCHOSCOPY (N/A) Right VIDEO ASSISTED THORACOSCOPY, Right upper Lobectomy with lymph node dissection and ONQ insertion (Right)   Subjective:  [name] has no complaints.  He is hopeful his chest tube will come out today.  Objective: Vital signs in last 24 hours: Temp:  [97.6 F (36.4 C)-98.7 F (37.1 C)] 98.1 F (36.7 C) (08/09 0700) Pulse Rate:  [86-112] 86 (08/09 0442) Cardiac Rhythm:  [-] Normal sinus rhythm (08/08 2000) Resp:  [20-22] 22 (08/08 2319) BP: (125-138)/(56-62) 126/62 mmHg (08/09 0442) SpO2:  [95 %-98 %] 97 % (08/09 0442)  Intake/Output from previous day: 08/08 0701 - 08/09 0700 In: 360 [P.O.:360] Out: 600 [Urine:550; Chest Tube:50]  General appearance: alert, cooperative and no distress Heart: regular rate and rhythm Lungs: clear to auscultation bilaterally Abdomen: soft, non-tender; bowel sounds normal; no masses,  no organomegaly Extremities: extremities normal, atraumatic, no cyanosis or edema Wound: clean and dry  Lab Results: No results for input(s): WBC, HGB, HCT, PLT in the last 72 hours. BMET: No results for input(s): NA, K, CL, CO2, GLUCOSE, BUN, CREATININE, CALCIUM in the last 72 hours.  PT/INR: No results for input(s): LABPROT, INR in the last 72 hours. ABG    Component Value Date/Time   PHART 7.417 01/31/2015 0355   HCO3 23.0 01/31/2015 0355   TCO2 24 01/31/2015 0355   ACIDBASEDEF 1.0 01/31/2015 0355   O2SAT 98.0 01/31/2015 0355   CBG (last 3)  No results for input(s): GLUCAP in the last 72 hours.  Assessment/Plan: S/P Procedure(s) (LRB): VIDEO BRONCHOSCOPY (N/A) Right VIDEO ASSISTED THORACOSCOPY, Right upper Lobectomy with lymph node dissection and ONQ insertion (Right)  1. Chest tube- tidaling decreased, CXR with stable appearance of pneumothorax-leave chest tube on water seal 2. Pulm- no  acute issues, off oxygen, continue IS 3. CV- HTN stable 4. Dispo- CXR remains stable, some tidaling remains, possibly remove CT today per Dr. Carrie Mew note yesterday  LOS: 7 days    BARRETT, Donald Delacruz 02/06/2015  With joint pain and ankles will resume preop mobeic  Chest xray in am if ok plan d/c home I have seen and examined Donald Delacruz and agree with the above assessment  and plan.  [name] MD Beeper [phone] Office [phone] 02/06/2015 6:37 PM

## 2015-02-07 ENCOUNTER — Inpatient Hospital Stay (HOSPITAL_COMMUNITY): Payer: Medicare Other

## 2015-02-07 MED ORDER — OXYCODONE HCL 5 MG PO TABS: 5.0000 mg | ORAL_TABLET | ORAL | Status: DC | PRN

## 2015-02-07 NOTE — Progress Notes (Signed)
Discharge instructions given to patient and son with teach back. Son wants patient to have walker for home.  Care management consulted, and orders given for home walker.  Awaiting home agency to deliver walker. Continue to watch.

## 2015-02-07 NOTE — Progress Notes (Signed)
      [address]       [place] [address]             [phone]      8 Days Post-Op Procedure(s) (LRB): VIDEO BRONCHOSCOPY (N/A) Right VIDEO ASSISTED THORACOSCOPY, Right upper Lobectomy with lymph node dissection and ONQ insertion (Right)   Subjective:  [name] has no complaints.  Wants to go home.  Objective: Vital signs in last 24 hours: Temp:  [98.1 F (36.7 C)-99.8 F (37.7 C)] 98.8 F (37.1 C) (08/10 0414) Pulse Rate:  [87-153] 87 (08/10 0415) Cardiac Rhythm:  [-] Normal sinus rhythm (08/10 0415) Resp:  [16-26] 16 (08/10 0415) BP: (99-142)/(51-66) 110/54 mmHg (08/10 0415) SpO2:  [97 %-100 %] 98 % (08/10 0415)  Intake/Output from previous day: 08/09 0701 - 08/10 0700 In: 240 [P.O.:240] Out: 900 [Urine:900]  General appearance: alert, cooperative and no distress Heart: regular rate and rhythm Lungs: clear to auscultation bilaterally Abdomen: soft, non-tender; bowel sounds normal; no masses,  no organomegaly Wound: clean and dry  Lab Results: No results for input(s): WBC, HGB, HCT, PLT in the last 72 hours. BMET: No results for input(s): NA, K, CL, CO2, GLUCOSE, BUN, CREATININE, CALCIUM in the last 72 hours.  PT/INR: No results for input(s): LABPROT, INR in the last 72 hours. ABG    Component Value Date/Time   PHART 7.417 01/31/2015 0355   HCO3 23.0 01/31/2015 0355   TCO2 24 01/31/2015 0355   ACIDBASEDEF 1.0 01/31/2015 0355   O2SAT 98.0 01/31/2015 0355   CBG (last 3)  No results for input(s): GLUCAP in the last 72 hours.  Assessment/Plan: S/P Procedure(s) (LRB): VIDEO BRONCHOSCOPY (N/A) Right VIDEO ASSISTED THORACOSCOPY, Right upper Lobectomy with lymph node dissection and ONQ insertion (Right)  1. Chest tube removed yesterday... CXR shows stable appearance of right pneumothorax 2. CV- HTN, stable continue home medications 3. Dispo- patient with stable pneumothorax post chest tube removal, will d/c today if okay with Dr.  Servando Snare   LOS: 8 days    Ellwood Handler 02/07/2015

## 2015-02-07 NOTE — Progress Notes (Signed)
  Discharge with rolling walker, no complaints.

## 2015-02-08 ENCOUNTER — Other Ambulatory Visit: Payer: Self-pay | Admitting: *Deleted

## 2015-02-08 ENCOUNTER — Other Ambulatory Visit: Payer: Self-pay | Admitting: Cardiothoracic Surgery

## 2015-02-08 DIAGNOSIS — C3411 Malignant neoplasm of upper lobe, right bronchus or lung: Secondary | ICD-10-CM

## 2015-02-08 DIAGNOSIS — C3410 Malignant neoplasm of upper lobe, unspecified bronchus or lung: Secondary | ICD-10-CM

## 2015-02-09 ENCOUNTER — Ambulatory Visit
Admission: RE | Admit: 2015-02-09 | Discharge: 2015-02-09 | Disposition: A | Discharge status: Home / Self Care | Payer: Medicare Other | Source: Ambulatory Visit | Attending: Cardiothoracic Surgery | Admitting: Cardiothoracic Surgery

## 2015-02-09 ENCOUNTER — Ambulatory Visit (INDEPENDENT_AMBULATORY_CARE_PROVIDER_SITE_OTHER): Payer: Self-pay | Admitting: Cardiothoracic Surgery

## 2015-02-09 DIAGNOSIS — C3410 Malignant neoplasm of upper lobe, unspecified bronchus or lung: Secondary | ICD-10-CM

## 2015-02-09 DIAGNOSIS — C3411 Malignant neoplasm of upper lobe, right bronchus or lung: Secondary | ICD-10-CM

## 2015-02-09 NOTE — Progress Notes (Signed)
[address]       [place] [address]             [phone]      Antwoine Myint [facility] Medical Record #[id number] Date of Birth: February 10, 1941  Referring: [name], MD Primary Care: [name], MD  Chief Complaint:   POST OP FOLLOW UP 01/30/2015 OPERATIVE REPORT PREOPERATIVE DIAGNOSIS: Adenocarcinoma, right upper lobe. POSTOPERATIVE DIAGNOSIS: Adenocarcinoma, right upper lobe. SURGICAL PROCEDURE: Video bronchoscopy, right video-assisted thoracoscopy with right upper lobectomy, lymph node dissection, and placement of On-Q. SURGEON: Lanelle Bal, MD  Lung cancer, right upper lobe   Staging form: Lung, AJCC 7th Edition     Clinical stage from 01/08/2015: Stage IIB (T3(2), N0, M0) - Signed by [name], MD on 02/02/2015     Pathologic stage from 02/02/2015: Stage IB (T2a, N0, cM0) - Signed by [name], MD on 02/02/2015  History of Present Illness:     Patient returns to the office today first suture removal, since discharge she has had increasing pain in his ankles the right ankle greater than the left. Without calf tenderness or swelling. He's no longer taking pain medication. In the office today his ankles were noted to be red and swollen right greater than left.     Past Medical History  Diagnosis Date  . Essential hypertension   . ED (erectile dysfunction)   . Insomnia   . Lipoma   . Osteoarthritis   . Emphysema lung   . Restless legs syndrome   . Coronary artery calcification seen on computed tomography June 2016    CT scan of chest: Coronary artery calcifications are noted  . Thoracic aortic atherosclerosis June 2016    CT scan chest: Atherosclerosis of thoracic aorta is noted without aneurysm or dissection. Visualized portion of upper abdomen demonstrates stable calcified splenic artery and right renal  . 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 Former Smoker -- 1.00 packs/day for 39 years  . Types: Cigarettes  . Quit date: 06/30/1997  Smokeless tobacco  . Never Used    Comment: SMOKED PIPES ALSO    History  Alcohol Use  . 0.0 oz/week  . 0 Standard drinks or equivalent per week    Comment: 3-4 BEERS PER DAY     Allergies  Allergen Reactions  . Amlodipine Swelling  . Benicar [Olmesartan]     Dizziness,malaise   . Nisoldipine Swelling    Current Outpatient Prescriptions  Medication Sig Dispense Refill  . acetaminophen (TYLENOL) 500 MG tablet Take 1,000 mg by mouth every 4 (four) hours as needed for moderate pain or headache.     [name] aspirin EC 81 MG tablet Take 81 mg by mouth every morning.     . Calcium Carbonate-Vitamin D (CALCIUM 600+D) 600-400 MG-UNIT per tablet Take 1 tablet by mouth every morning.     . clobetasol ointment (TEMOVATE) 0.05 % Clobetasol Propionate 0.05 % External Ointment Apply to rash, and rub in well, twice a day as needed.  Quantity: 1;  Refills: 2   Wilson M.D., Jama Flavors ;  Start 24-Apr-2010 Active 30 GM Tube    . diltiazem (CARDIZEM CD) 180 MG 24 hr capsule Take 180 mg by mouth every morning.     . gabapentin (NEURONTIN) 300 MG capsule Take 300-600 mg by mouth at bedtime.     [name]  lisinopril (PRINIVIL,ZESTRIL) 20 MG tablet Take 20 mg by mouth every morning.     . meloxicam (MOBIC) 15 MG tablet Take 15 mg by mouth daily.    . metoprolol succinate (TOPROL-XL) 100 MG 24 hr tablet Take 100 mg by mouth every morning.     . Multiple Vitamin (MULTIVITAMIN) capsule Take 1 capsule by mouth every morning.     . Omega-3 Fatty Acids (FISH OIL) 1000 MG CAPS Take 1,000 mg by mouth daily.    [name] oxyCODONE (OXY IR/ROXICODONE) 5 MG immediate release tablet Take 1 tablet (5 mg total) by mouth every 4 (four) hours as needed for severe pain. 30 tablet 0  . sildenafil (VIAGRA) 100 MG tablet Take 100 mg by mouth daily as needed for erectile dysfunction.     . Turmeric 450 MG CAPS Take 500  mg by mouth every morning.     . vardenafil (LEVITRA) 20 MG tablet Take 20 mg by mouth daily as needed for erectile dysfunction.      No current facility-administered medications for this visit.       Physical Exam: There were no vitals taken for this visit.  General appearance: alert and cooperative Neurologic: intact Heart: regular rate and rhythm, S1, S2 normal, no murmur, click, rub or gallop Lungs: clear to auscultation bilaterally Abdomen: soft, non-tender; bowel sounds normal; no masses,  no organomegaly Extremities: Both ankles are tender and swollen right greater than left this does not extend to the calves the right medial malleolus is warm and red Wound: Patient's chest tube sites incision is healing well without evidence of infection   Diagnostic Studies & Laboratory data:     Recent Radiology Findings:   Dg Chest 2 View  02/09/2015   CLINICAL DATA:  Recent surg on Rt lung, upper lobe neoplasm, s.o.b today, weakness, stat reading, pt gone to office now for appt  EXAM: CHEST  2 VIEW  COMPARISON:  02/07/2015  FINDINGS: No change in the approximately 15% pneumothorax noted at the right apex when compared to the prior study.  Pulmonary anastomosis staples along the superior right hilum, and tenting of the right hemidiaphragm with mild right hemi thorax volume loss is stable.  No lung consolidation or edema. No pleural effusion. No left pneumothorax.  Cardiac silhouette is normal in size. No mediastinal mass or adenopathy.  IMPRESSION: 1. No significant change from the most recent prior study. 2. No change in the small right pneumothorax. 3. Stable postsurgical changes on the right. No evidence of pneumonia or pulmonary edema.   Electronically Signed   By: Lajean Manes M.D.   On: 02/09/2015 09:44      Recent Lab Findings: Lab Results  Component Value Date   WBC 7.0 02/03/2015   HGB 9.8* 02/03/2015   HCT 29.4* 02/03/2015   PLT 189 02/03/2015   GLUCOSE 105* 02/03/2015   ALT  16* 02/01/2015   AST 25 02/01/2015   NA 137 02/03/2015   K 4.4 02/03/2015   CL 103 02/03/2015   CREATININE 1.25* 02/03/2015   BUN 21* 02/03/2015   CO2 26 02/03/2015   INR 0.99 01/26/2015      Assessment / Plan:     Patient doing well following right upper lobectomy for carcinoma the lung with exception of the increasing pain of his ankles. This may be related to holding he is Modic in the perioperative period. This was resumed before his discharge. His unlikely to be related to DVT with the point tenderness erythema over the  ankle joints. Question of gout or pseudogout is considered. We will have him seen by orthopedics today. Otherwise he will keep his regular appointment with follow-up chest x-ray in 10 days    [name] MD      [address] [place] [address] Office [phone]   Beeper [phone]  02/09/2015 1:29 PM

## 2015-02-12 ENCOUNTER — Ambulatory Visit: Payer: Medicare Other

## 2015-02-20 ENCOUNTER — Other Ambulatory Visit: Payer: Self-pay | Admitting: Cardiothoracic Surgery

## 2015-02-20 DIAGNOSIS — C3411 Malignant neoplasm of upper lobe, right bronchus or lung: Secondary | ICD-10-CM

## 2015-02-22 ENCOUNTER — Encounter: Payer: Self-pay | Admitting: Cardiothoracic Surgery

## 2015-02-22 ENCOUNTER — Ambulatory Visit
Admission: RE | Admit: 2015-02-22 | Discharge: 2015-02-22 | Disposition: A | Discharge status: Home / Self Care | Payer: Medicare Other | Source: Ambulatory Visit | Attending: Cardiothoracic Surgery | Admitting: Cardiothoracic Surgery

## 2015-02-22 ENCOUNTER — Ambulatory Visit (INDEPENDENT_AMBULATORY_CARE_PROVIDER_SITE_OTHER): Payer: Self-pay | Admitting: Cardiothoracic Surgery

## 2015-02-22 ENCOUNTER — Other Ambulatory Visit: Payer: Self-pay | Admitting: *Deleted

## 2015-02-22 VITALS — BP 127/77 | HR 90 | Resp 20 | Ht 70.0 in | Wt 170.0 lb

## 2015-02-22 DIAGNOSIS — Z9889 Other specified postprocedural states: Secondary | ICD-10-CM

## 2015-02-22 DIAGNOSIS — Z902 Acquired absence of lung [part of]: Secondary | ICD-10-CM

## 2015-02-22 DIAGNOSIS — C3411 Malignant neoplasm of upper lobe, right bronchus or lung: Secondary | ICD-10-CM

## 2015-02-22 NOTE — Progress Notes (Signed)
[address]       [place] [address]             [phone]                  Donald Delacruz Medical Record #[id number] Date of Birth: 29-Jan-1941  Referring [place]:[id number], Jama Flavors, MD Primary Cardiology: Primary Care:WILSON,FRED Mallie Mussel, MD  Chief Complaint:  Follow Up Visit 01/30/2015  OPERATIVE REPORT PREOPERATIVE DIAGNOSIS: Adenocarcinoma, right upper lobe. POSTOPERATIVE DIAGNOSIS: Adenocarcinoma, right upper lobe. SURGICAL PROCEDURE: Video bronchoscopy, right video-assisted thoracoscopy with right upper lobectomy, lymph node dissection, and placement of On-Q. SURGEON: Lanelle Bal, MD  Lung cancer, right upper lobe   Staging form: Lung, AJCC 7th Edition     Clinical stage from 01/08/2015: Stage IIB (T3(2), N0, M0) - Signed by [name], MD on 02/02/2015     Pathologic stage from 02/02/2015: Stage IB (T2a, N0, cM0) - Signed by [name], MD on 02/02/2015  Myriad Prognostic score 31  -  25% 5 year mortality  History of Present Illness:     Patient is making reasonable progress following recent right upper lobectomy. Early postoperatively he developed ankle edema and painful right foot and was seen by orthopedics. An inflammatory is has improved this and he is ambulating without walker or cane. He does still have some shortness of breath with exertion but this is improving. He's had no fever chills      Zubrod Score: At the time of surgery this patient's most appropriate activity status/level should be described as: _0     0    Normal activity, no symptoms _1     1    Restricted in physical strenuous activity but ambulatory, able to do out light work _2     2    Ambulatory and capable of self care, unable to do work activities, up and about                 >50 % of waking hours                                                                                   _3     3    Only limited self care, in bed greater than 50% of waking  hours _4     4    Completely disabled, no self care, confined to bed or chair _5     5    Moribund  History  Smoking status  . Former Smoker -- 1.00 packs/day for 39 years  . Types: Cigarettes  . Quit date: 06/30/1997  Smokeless tobacco  . Never Used    Comment: SMOKED PIPES ALSO       Allergies  Allergen Reactions  . Amlodipine Swelling  . Benicar [Olmesartan]     Dizziness,malaise   . Nisoldipine Swelling    Current Outpatient Prescriptions  Medication Sig Dispense Refill  . acetaminophen (TYLENOL) 500 MG tablet Take 1,000 mg by mouth every 4 (four) hours as needed for moderate pain or headache.     [name] aspirin EC 81 MG tablet Take 81 mg by mouth every morning.     [name]  Calcium Carbonate-Vitamin D (CALCIUM 600+D) 600-400 MG-UNIT per tablet Take 1 tablet by mouth every morning.     . clobetasol ointment (TEMOVATE) 0.05 % Clobetasol Propionate 0.05 % External Ointment Apply to rash, and rub in well, twice a day as needed.  Quantity: 1;  Refills: 2   Wilson M.D., Jama Flavors ;  Start 24-Apr-2010 Active 30 GM Tube    . gabapentin (NEURONTIN) 300 MG capsule Take 300-600 mg by mouth at bedtime.     [name] lisinopril (PRINIVIL,ZESTRIL) 20 MG tablet Take 20 mg by mouth every morning.     . meloxicam (MOBIC) 15 MG tablet Take 15 mg by mouth daily.    . metoprolol succinate (TOPROL-XL) 100 MG 24 hr tablet Take 100 mg by mouth every morning.     . Multiple Vitamin (MULTIVITAMIN) capsule Take 1 capsule by mouth every morning.     . Omega-3 Fatty Acids (FISH OIL) 1000 MG CAPS Take 1,000 mg by mouth daily.    . sildenafil (VIAGRA) 100 MG tablet Take 100 mg by mouth daily as needed for erectile dysfunction.     . Turmeric 450 MG CAPS Take 500 mg by mouth every morning.     . vardenafil (LEVITRA) 20 MG tablet Take 20 mg by mouth daily as needed for erectile dysfunction.     [name] diltiazem (CARDIZEM CD) 180 MG 24 hr capsule Take 180 mg by mouth every morning.      No current facility-administered medications  for this visit.       Physical Exam: BP 127/77 mmHg  Pulse 90  Resp 20  Ht _0  (1.778 m)  Wt 170 lb (77.111 kg)  BMI 24.39 kg/m2  SpO2 98%  General appearance: alert and cooperative Neurologic: intact Heart: regular rate and rhythm, S1, S2 normal, no murmur, click, rub or gallop Lungs: clear to auscultation bilaterally Abdomen: soft, non-tender; bowel sounds normal; no masses,  no organomegaly Extremities: extremities normal, atraumatic, no cyanosis or edema and Homans sign is negative, no sign of DVT Wound: Right chest incisions and chest tube sites are healing well without evidence of infection   Diagnostic Studies & Laboratory data:         Recent Radiology Findings: Dg Chest 2 View  02/22/2015   CLINICAL DATA:  Lung cancer.  EXAM: CHEST  2 VIEW  COMPARISON:  02/09/2015.  FINDINGS: Heart size normal.Deformity noted of the right upper lobe consistent with scarring. Right apical pneumothorax appears resolved. Interval resolution of right chest wall subcutaneous emphysema. No acute bony abnormality .  IMPRESSION: 1. Right apical pneumothorax appears resolved. Resolution of right chest wall subcutaneous emphysema.  2.  Postsurgical changes right lung.  No acute abnormality.   Electronically Signed   By: Marcello Moores  Register   On: 02/22/2015 12:29      I have independently reviewed the above radiology findings and reviewed findings  with the patient.  Recent Labs: Lab Results  Component Value Date   WBC 7.0 02/03/2015   HGB 9.8* 02/03/2015   HCT 29.4* 02/03/2015   PLT 189 02/03/2015   GLUCOSE 105* 02/03/2015   ALT 16* 02/01/2015   AST 25 02/01/2015   NA 137 02/03/2015   K 4.4 02/03/2015   CL 103 02/03/2015   CREATININE 1.25* 02/03/2015   BUN 21* 02/03/2015   CO2 26 02/03/2015   INR 0.99 01/26/2015      Assessment / Plan:   Patient stable early postoperatively taking continued improvement in physical activity. We'll refer to medical  oncology to review his  pathology, Myriad score , and put him into each with the oncology support services that he needs recovering from his surgery with his wife who also has dementia. Plan to see him back in one month with a follow-up chest x-ray    [name] 02/22/2015 1:12 PM

## 2015-02-23 ENCOUNTER — Encounter: Payer: Self-pay | Admitting: Cardiothoracic Surgery

## 2015-02-26 ENCOUNTER — Telehealth: Payer: Self-pay | Admitting: *Deleted

## 2015-02-26 DIAGNOSIS — Z902 Acquired absence of lung [part of]: Secondary | ICD-10-CM

## 2015-02-26 NOTE — Telephone Encounter (Signed)
Oncology Nurse Navigator Documentation  Oncology Nurse Navigator Flowsheets 02/26/2015  Navigator Encounter Type Introductory phone call/I received a referral form Dr. Servando Snare.  I called patient and scheduled him to be seen at [facility] on 03/08/15 arrive at 12:30.  Patient verbalized understanding of appt time and place.   Interventions Coordination of Care  Coordination of Care MD Appointments  Time Spent with Patient 15

## 2015-03-07 ENCOUNTER — Telehealth: Payer: Self-pay | Admitting: *Deleted

## 2015-03-07 NOTE — Telephone Encounter (Signed)
Called and confirmed 03/08/15 clinic appt w/ pt.

## 2015-03-08 ENCOUNTER — Telehealth: Payer: Self-pay | Admitting: Internal Medicine

## 2015-03-08 ENCOUNTER — Other Ambulatory Visit (HOSPITAL_BASED_OUTPATIENT_CLINIC_OR_DEPARTMENT_OTHER): Payer: Medicare Other

## 2015-03-08 ENCOUNTER — Encounter: Payer: Self-pay | Admitting: Internal Medicine

## 2015-03-08 ENCOUNTER — Encounter: Payer: Self-pay | Admitting: *Deleted

## 2015-03-08 ENCOUNTER — Ambulatory Visit (HOSPITAL_BASED_OUTPATIENT_CLINIC_OR_DEPARTMENT_OTHER): Payer: Medicare Other | Admitting: Internal Medicine

## 2015-03-08 ENCOUNTER — Ambulatory Visit: Payer: Medicare Other | Attending: Internal Medicine | Admitting: Physical Therapy

## 2015-03-08 VITALS — BP 147/73 | HR 87 | Temp 98.1°F | Resp 18 | Ht 70.0 in | Wt 165.7 lb

## 2015-03-08 DIAGNOSIS — I2510 Atherosclerotic heart disease of native coronary artery without angina pectoris: Secondary | ICD-10-CM | POA: Diagnosis not present

## 2015-03-08 DIAGNOSIS — I10 Essential (primary) hypertension: Secondary | ICD-10-CM | POA: Diagnosis not present

## 2015-03-08 DIAGNOSIS — F1099 Alcohol use, unspecified with unspecified alcohol-induced disorder: Secondary | ICD-10-CM

## 2015-03-08 DIAGNOSIS — R293 Abnormal posture: Secondary | ICD-10-CM | POA: Diagnosis present

## 2015-03-08 DIAGNOSIS — C3411 Malignant neoplasm of upper lobe, right bronchus or lung: Secondary | ICD-10-CM | POA: Diagnosis not present

## 2015-03-08 DIAGNOSIS — R0602 Shortness of breath: Secondary | ICD-10-CM | POA: Insufficient documentation

## 2015-03-08 DIAGNOSIS — Z87891 Personal history of nicotine dependence: Secondary | ICD-10-CM | POA: Diagnosis not present

## 2015-03-08 DIAGNOSIS — Z902 Acquired absence of lung [part of]: Secondary | ICD-10-CM

## 2015-03-08 LAB — CBC WITH DIFFERENTIAL/PLATELET
BASO%: 0.5 % (ref 0.0–2.0)
Basophils Absolute: 0 10*3/uL (ref 0.0–0.1)
EOS%: 3.1 % (ref 0.0–7.0)
Eosinophils Absolute: 0.1 10*3/uL (ref 0.0–0.5)
HCT: 34 % — ABNORMAL LOW (ref 38.4–49.9)
HGB: 11.3 g/dL — ABNORMAL LOW (ref 13.0–17.1)
LYMPH%: 26.8 % (ref 14.0–49.0)
MCH: 31.5 pg (ref 27.2–33.4)
MCHC: 33.2 g/dL (ref 32.0–36.0)
MCV: 95.1 fL (ref 79.3–98.0)
MONO#: 0.6 10*3/uL (ref 0.1–0.9)
MONO%: 15.6 % — AB (ref 0.0–14.0)
NEUT%: 54 % (ref 39.0–75.0)
NEUTROS ABS: 2.1 10*3/uL (ref 1.5–6.5)
Platelets: 172 10*3/uL (ref 140–400)
RBC: 3.57 10*6/uL — ABNORMAL LOW (ref 4.20–5.82)
RDW: 14.5 % (ref 11.0–14.6)
WBC: 3.9 10*3/uL — AB (ref 4.0–10.3)
lymph#: 1 10*3/uL (ref 0.9–3.3)

## 2015-03-08 LAB — COMPREHENSIVE METABOLIC PANEL (CC13)
ALT: 15 U/L (ref 0–55)
ANION GAP: 8 meq/L (ref 3–11)
AST: 16 U/L (ref 5–34)
Albumin: 3.6 g/dL (ref 3.5–5.0)
Alkaline Phosphatase: 61 U/L (ref 40–150)
BILIRUBIN TOTAL: 0.49 mg/dL (ref 0.20–1.20)
BUN: 17.7 mg/dL (ref 7.0–26.0)
CO2: 24 meq/L (ref 22–29)
Calcium: 9.4 mg/dL (ref 8.4–10.4)
Chloride: 109 mEq/L (ref 98–109)
Creatinine: 1 mg/dL (ref 0.7–1.3)
EGFR: 73 mL/min/{1.73_m2} — ABNORMAL LOW (ref >90)
GLUCOSE: 119 mg/dL (ref 70–140)
POTASSIUM: 4.6 meq/L (ref 3.5–5.1)
SODIUM: 142 meq/L (ref 136–145)
TOTAL PROTEIN: 6.4 g/dL (ref 6.4–8.3)

## 2015-03-08 NOTE — Progress Notes (Signed)
[place] [phone]   [phone] Multidisciplinary thoracic oncology clinic  CONSULT NOTE  REFERRING PHYSICIAN: Dr. Lanelle Bal  REASON FOR CONSULTATION:  74 years old white male recently diagnosed with lung cancer.  HPI Donald Delacruz is a 74 y.o. male with past medical history significant for hypertension, restless leg syndrome, coronary artery disease, renal artery aneurysm, COPD as well as long history of smoking but quit in January 1999. The patient was seen by his primary care physician Dr. Redmond Pulling for routine physical examination and because of his long history of smoking he had chest x-ray performed and it showed an opacity in the right upper lobe. This was followed by CT scan of the chest on 11/29/2014 and it showed cholesterol nodules and groundglass opacity seen in the right upper lobe has significantly enlarged in size compared to November 2015. This was followed by a PET scan on 12/06/2014 and it showed Spiculated right apical solid and sub solid pulmonary nodule is hypermetabolic. The dominant area measures 2.8 x 2.3 cm and a S.U.V.max of 5.8. Contiguous or satellite nodules inferiorly and anteriorly measure up to 1.0 cm and a S.U.V. max of 3.5. There was no evidence of thoracic nodal or extrathoracic hypermetabolic metastasis. The patient was referred to Dr. Madolyn Frieze and on 12/28/2014 he underwent CT-guided biopsy of the right upper lobe lesion by interventional radiology and the final pathology (Accession: [id number]) showed adenocarcinoma. There is a small focus of malignant appearing glands. Immunohistochemistry reveals the malignant cells are positive for NapsinA and TTF-1. They are negative for p63 and cytokeratin 5/6. This immunoprofile is consistent with a lung adenocarcinoma. On 01/30/2015 the patient underwent Video bronchoscopy, right video-assisted thoracoscopy with right upper lobectomy, lymph node dissection.  The final pathology  (Accession: [id number]) showed invasive moderately to poorly differentiated adenocarcinoma, spanning 3.8 cm in greatest dimension. There was no visceral pleural involvement and no lymphovascular invasion. The dissected lymph nodes were negative for malignancy. Dr. Servando Snare sent the tissue to [facility] lung cancer prognostic test and it showed high risk with a prognostic score of 31 and 25% risk of dying from lung cancer in 5 years. The patient was referred to the multidisciplinary thoracic oncology clinic today for evaluation and discussion of his treatment options. When seen today the patient is feeling fine with no specific complaints except for mild shortness of breath. He denied having any significant chest pain, cough or hemoptysis. Has no significant weight loss or night sweats. The patient denied having any headache or visual changes. He has no nausea, vomiting or change in bowel movement. Family history significant for mother died from lung cancer at age 60, father died from multiple myeloma in his 64s and he also has a sister with lung cancer. The patient is married and has 2 sons. He was accompanied today by his wife Donald Delacruz. 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 He has a history of smoking 1 pack per day for around city 9 years but quit smoking in 06/30/1997. He drinks alcohol on daily basis. He has no history of drug abuse.  HPI  Past Medical History  Diagnosis Date  . Essential hypertension   . ED (erectile dysfunction)   . Insomnia   . Lipoma   . Osteoarthritis   . Emphysema lung   . Restless legs syndrome   . Coronary artery calcification seen on computed tomography June 2016    CT scan of chest: Coronary artery calcifications are noted  . Thoracic aortic atherosclerosis  June 2016    CT scan chest: Atherosclerosis of thoracic aorta is noted without aneurysm or dissection. Visualized portion of upper abdomen demonstrates stable calcified splenic artery and right renal  . 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 Cataract extraction    . Pilonidal cyst excision    . Video bronchoscopy N/A 01/30/2015    Procedure: VIDEO BRONCHOSCOPY;  Surgeon: [name], MD;  Location: [facility] OR;  Service: Thoracic;  Laterality: N/A;  . Video assisted thoracoscopy (vats)/wedge resection Right 01/30/2015    Procedure: Right VIDEO ASSISTED THORACOSCOPY, Right upper Lobectomy with lymph node dissection and ONQ insertion;  Surgeon: [name], MD;  Location: [place];  Service: Thoracic;  Laterality: Right;    Family History  Problem Relation Age of Onset  . Lung cancer Mother     Social History Social History  Substance Use Topics  . Smoking status: Former Smoker -- 1.00 packs/day for 39 years    Types: Cigarettes    Quit date: 06/30/1997  . Smokeless tobacco: Never Used     Comment: SMOKED PIPES ALSO  . Alcohol Use: 0.0 oz/week    0 Standard drinks or equivalent per week     Comment: 3-4 BEERS PER DAY    Allergies  Allergen Reactions  . Amlodipine Swelling  . Benicar [Olmesartan]     Dizziness,malaise   . Nisoldipine Swelling    Current Outpatient Prescriptions  Medication Sig Dispense Refill  . aspirin EC 81 MG tablet Take 81 mg by mouth every morning.     . Calcium Carbonate-Vitamin D (CALCIUM 600+D) 600-400 MG-UNIT per tablet Take 1 tablet by mouth every morning.     . gabapentin (NEURONTIN) 300 MG capsule Take 300-600 mg by mouth at bedtime.     [name] lisinopril (PRINIVIL,ZESTRIL) 20 MG tablet Take 20 mg by mouth every morning.     . meloxicam (MOBIC) 15 MG tablet Take 15 mg by mouth daily.    . metoprolol succinate (TOPROL-XL) 100 MG 24 hr tablet Take 100 mg by mouth every morning.     . Multiple Vitamin (MULTIVITAMIN) capsule Take 1 capsule by mouth every morning.     . Omega-3 Fatty Acids (FISH OIL) 1000 MG CAPS Take 1,000 mg by mouth daily.     . Turmeric 450 MG CAPS Take 500 mg by mouth every morning.     [name] acetaminophen (TYLENOL) 500 MG tablet Take 1,000 mg by mouth every 4 (four) hours as needed for moderate pain or headache.     . clobetasol ointment (TEMOVATE) 0.05 % Clobetasol Propionate 0.05 % External Ointment Apply to rash, and rub in well, twice a day as needed.  Quantity: 1;  Refills: 2   Wilson M.D., [employer] ;  Start 24-Apr-2010 Active 30 GM Tube    . diltiazem (CARDIZEM CD) 180 MG 24 hr capsule Take 180 mg by mouth every morning.     . sildenafil (VIAGRA) 100 MG tablet Take 100 mg by mouth daily as needed for erectile dysfunction.     . vardenafil (LEVITRA) 20 MG tablet Take 20 mg by mouth daily as needed for erectile dysfunction.      No current facility-administered medications for this visit.    Review of Systems  Constitutional: negative Eyes: negative Ears, nose, mouth, throat, and face: negative Respiratory: positive for dyspnea on exertion Cardiovascular:  negative Gastrointestinal: negative Genitourinary:negative Integument/breast: negative Hematologic/lymphatic: negative Musculoskeletal:negative Neurological: negative Behavioral/Psych: negative Endocrine: negative Allergic/Immunologic: negative  Physical Exam  HCW:CBJSE, healthy, no distress, well nourished and well developed SKIN: skin color, texture, turgor are normal, no rashes or significant lesions HEAD: Normocephalic, No masses, lesions, tenderness or abnormalities EYES: normal, PERRLA, Conjunctiva are pink and non-injected EARS: External ears normal, Canals clear OROPHARYNX:no exudate, no erythema and lips, buccal mucosa, and tongue normal  NECK: supple, no adenopathy, thyroid normal size, non-tender, without nodularity LYMPH:  no palpable lymphadenopathy, no hepatosplenomegaly LUNGS: clear to auscultation , and palpation HEART: regular rate & rhythm, no murmurs and no gallops ABDOMEN:abdomen soft, non-tender, normal bowel sounds and no  masses or organomegaly BACK: Back symmetric, no curvature., No CVA tenderness EXTREMITIES:no joint deformities, effusion, or inflammation, no edema, no skin discoloration  NEURO: alert & oriented x 3 with fluent speech, no focal motor/sensory deficits  PERFORMANCE STATUS: ECOG 1  LABORATORY DATA: Lab Results  Component Value Date   WBC 3.9* 03/08/2015   HGB 11.3* 03/08/2015   HCT 34.0* 03/08/2015   MCV 95.1 03/08/2015   PLT 172 03/08/2015      Chemistry      Component Value Date/Time   NA 142 03/08/2015 1240   NA 137 02/03/2015 0236   K 4.6 03/08/2015 1240   K 4.4 02/03/2015 0236   CL 103 02/03/2015 0236   CO2 24 03/08/2015 1240   CO2 26 02/03/2015 0236   BUN 17.7 03/08/2015 1240   BUN 21* 02/03/2015 0236   CREATININE 1.0 03/08/2015 1240   CREATININE 1.25* 02/03/2015 0236      Component Value Date/Time   CALCIUM 9.4 03/08/2015 1240   CALCIUM 8.6* 02/03/2015 0236   ALKPHOS 61 03/08/2015 1240   ALKPHOS 33* 02/01/2015 0415   AST 16 03/08/2015 1240   AST 25 02/01/2015 0415   ALT 15 03/08/2015 1240   ALT 16* 02/01/2015 0415   BILITOT 0.49 03/08/2015 1240   BILITOT 0.4 02/01/2015 0415       RADIOGRAPHIC STUDIES: Dg Chest 2 View  02/22/2015   CLINICAL DATA:  Lung cancer.  EXAM: CHEST  2 VIEW  COMPARISON:  02/09/2015.  FINDINGS: Heart size normal.Deformity noted of the right upper lobe consistent with scarring. Right apical pneumothorax appears resolved. Interval resolution of right chest wall subcutaneous emphysema. No acute bony abnormality .  IMPRESSION: 1. Right apical pneumothorax appears resolved. Resolution of right chest wall subcutaneous emphysema.  2.  Postsurgical changes right lung.  No acute abnormality.   Electronically Signed   By: Marcello Moores  Register   On: 02/22/2015 12:29   Dg Chest 2 View  02/09/2015   CLINICAL DATA:  Recent surg on Rt lung, upper lobe neoplasm, s.o.b today, weakness, stat reading, pt gone to office now for appt  EXAM: CHEST  2 VIEW   COMPARISON:  02/07/2015  FINDINGS: No change in the approximately 15% pneumothorax noted at the right apex when compared to the prior study.  Pulmonary anastomosis staples along the superior right hilum, and tenting of the right hemidiaphragm with mild right hemi thorax volume loss is stable.  No lung consolidation or edema. No pleural effusion. No left pneumothorax.  Cardiac silhouette is normal in size. No mediastinal mass or adenopathy.  IMPRESSION: 1. No significant change from the most recent prior study. 2. No change in the small right pneumothorax. 3. Stable postsurgical changes on the right. No evidence of pneumonia or pulmonary edema.   Electronically Signed   By: Shanon Brow  Ormond M.D.   On: 02/09/2015 09:44   Dg Chest 2 View  02/07/2015   CLINICAL DATA:  Weakness chest discomfort, right-sided pneumothorax.  EXAM: CHEST  2 VIEW  COMPARISON:  Chest x-ray of February 06, 2015  FINDINGS: There has been interval removal of the right chest tube. The right apical pneumothorax is slightly larger with the pleural line at the apex now present at the level of the third and fourth rib interspace posteriorly. There is no mediastinal shift. There is no significant right pleural effusion. The left lung is hyperinflated and clear. Right hilar fullness persists. The heart is normal in size. The observed bony thorax is unremarkable.  IMPRESSION: Slight interval increase in the volume of the right-sided pneumothorax. It amounts to 15% or less of the lung volume today.   Electronically Signed   By: David  Martinique M.D.   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 The [facility] lung cancer prognostic test showed high risk prognostic score of 31 and  25% risk of dying from lung cancer in 5 years.  PLAN: I had a lengthy discussion with the patient and his wife today about his current disease stage, prognosis and treatment options. I explained to the patient that the current standard of care is not to consider him for adjuvant systemic chemotherapy for a stage IB with tumor size of less than 4.0 cm but the prognostic test is little bit concerning for disease recurrence and death from lung cancer To 25% in 5 years. I also explained to the patient that this test is not currently FDA approved but it could serve as a guidance for discussion of adjuvant chemotherapy. I offered the patient the option of continuous observation and close monitoring versus proceeding with 4 cycles of adjuvant systemic chemotherapy with cisplatin 75 MG/M2 and Alimta 500 MG/M2 every 3 weeks. I discussed with the patient adverse effects of this treatment including but not limited to alopecia, myelosuppression, nausea and vomiting, peripheral neuropathy, liver or renal dysfunction. We will arrange for the patient to receive vitamin B 12 injection if he would like to proceed with the adjuvant therapy. The patient would also receive prescription for Compazine 10 mg by mouth every 6 hours as needed for nausea, Decadron 4 mg by mouth twice a day, the day before, day of and day after the chemotherapy in addition to folic acid 1 mg by mouth daily. The patient requested some time to think about his option. I will complete the staging workup by ordering CT scan of the head to rule out brain metastasis. The patient was seen during the multidisciplinary thoracic oncology clinic today by medical oncology, thoracic navigator, social worker and physical therapist. He will call the office in the next 1-2 weeks with his final decision regarding adjuvant chemotherapy or observation. He was advised to call immediately if he has any concerning symptoms in the interval. The patient voices  understanding of current disease status and treatment options and is in agreement with the current care plan.  All questions were answered. The patient knows to call the clinic with any problems, questions or concerns. We can certainly see the patient much sooner if necessary.  Thank you so much for allowing me to participate  in the care of Donald Delacruz. I will continue to follow up the patient with you and assist in his care.  I spent 55 minutes counseling the patient face to face. The total time spent in the appointment was 80 minutes.  Disclaimer: This note was dictated with voice recognition software. Similar sounding words can inadvertently be transcribed and may not be corrected upon review.   [place] Gotwalt K. March 08, 2015, 2:28 PM

## 2015-03-08 NOTE — Telephone Encounter (Signed)
per pof to sch pt back top lab

## 2015-03-08 NOTE — Therapy (Signed)
[place], [place], [address] Phone: [phone]   Fax:  [phone]  Physical Therapy Evaluation  Patient Details  Name: Donald Delacruz MRN: [id number] Date of Birth: 1940-09-01 Referring Provider:  [name], MD  Encounter Date: 03/08/2015      PT End of Session - 03/08/15 1531    Visit Number 1   Number of Visits 1   PT Start Time 1325   PT Stop Time 1350   PT Time Calculation (min) 25 min   Activity Tolerance Patient tolerated treatment well  but had shortness of breath with talking, activity   Behavior During Therapy [facility] for tasks assessed/performed      Past Medical History  Diagnosis Date  . Essential hypertension   . ED (erectile dysfunction)   . Insomnia   . Lipoma   . Osteoarthritis   . Emphysema lung   . Restless legs syndrome   . Coronary artery calcification seen on computed tomography June 2016    CT scan of chest: Coronary artery calcifications are noted  . Thoracic aortic atherosclerosis June 2016    CT scan chest: Atherosclerosis of thoracic aorta is noted without aneurysm or dissection. Visualized portion of upper abdomen demonstrates stable calcified splenic artery and right renal  . 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 Cataract extraction    . Pilonidal cyst excision    . Video bronchoscopy N/A 01/30/2015    Procedure: VIDEO BRONCHOSCOPY;  Surgeon: [name], MD;  Location: [facility] OR;  Service: Thoracic;  Laterality: N/A;  . Video assisted thoracoscopy (vats)/wedge resection Right 01/30/2015    Procedure: Right VIDEO ASSISTED THORACOSCOPY, Right upper Lobectomy with lymph node dissection and ONQ insertion;  Surgeon: [name], MD;  Location: [place];  Service: Thoracic;  Laterality: Right;    There were no vitals filed for this  visit.  Visit Diagnosis:  Shortness of breath on exertion - Plan: PT plan of care cert/re-cert  Poor posture - Plan: PT plan of care cert/re-cert      Subjective Assessment - 03/08/15 1517    Subjective No real pain; has some skin sensitivity at both anterior thighs and right anterior chest.   Patient is accompained by: Family member  wife   Pertinent History Patient had routine x-ray, then was followed with CT scans.  12/18/14 had CT chest, PET, and CT biopsy, then VATS lobectomy 01/30/2015.  Diagnosis is right upper lobe invasive adenocarcinoma, stage IB.  Patient may have adjuvant chemotherapy.  Ex-smoker who quit 1999 (16 pack-years).                                                                 Patient Stated Goals get information form lung clinic providers   Currently in Pain? No/denies            [facility] PT Assessment - 03/08/15 0001    Assessment   Medical Diagnosis right upper lobe adenocarcinoma, stage IB   Onset Date/Surgical Date 01/30/15  VATS lobectomy   Precautions   Precautions Other (comment)   Precaution Comments no lifting > 25 lbs.;  cancer precautions   Restrictions   Weight Bearing Restrictions No   Balance Screen   Has the patient fallen in the past 6 months No   Has the patient had a decrease in activity level because of a fear of falling?  No   Is the patient reluctant to leave their home because of a fear of falling?  No   Home Ecologist residence   Living Arrangements Spouse/significant other   Type of [place] Two level  doesn't have to go upstairs, but can   Prior Function   Level of Independence Independent   Leisure exercises on elliptical trainer for 30 minutes 5 days/week, but somewhat irregularly   Cognition   Overall Cognitive Status Within Functional Limits for tasks assessed   Observation/Other Assessments   Observations well-looking older man who appears mildly short of breath when talking    Functional Tests   Functional tests Sit to Stand   Sit to Stand   Comments 11 times in 30 seconds, and becomes short of breath with this   Posture/Postural Control   Posture/Postural Control Postural limitations   Postural Limitations Forward head   ROM / Strength   AROM / PROM / Strength AROM   AROM   Overall AROM Comments standing trunk flexion--reaches 6 inches fingertips to floor; extension 25% loss; sidebend WFL bilat.; rotation 20% loss bilat.   Ambulation/Gait   Ambulation/Gait Yes   Ambulation/Gait Assistance 7: Independent  per patient   Balance   Balance Assessed Yes   Dynamic Standing Balance   Dynamic Standing - Comments reaches 10 inches forward in standing                           PT Education - Mar 25, 2015 1530    Education provided Yes   Education Details posture, breathing, energy conservation, walking, Cure article on staying active, PT info   Person(s) Educated Patient;Spouse   Methods Explanation;Handout   Comprehension Verbalized understanding               Lung Clinic Goals - 03-25-15 1535    Patient will be able to verbalize understanding of the benefit of exercise to decrease fatigue.   Status Achieved   Patient will be able to verbalize the importance of posture.   Status Achieved   Patient will be able to demonstrate diaphragmatic breathing for improved lung function.   Status Achieved   Patient will be able to verbalize understanding of the role of physical therapy to prevent functional decline and who to contact if physical therapy is needed.   Status Achieved             Plan - 2015/03/25 1531    Clinical Impression Statement 74 year-old man who looks well s/p lobectomy for adenocarcinoma, but becomes short of breath with talking or activity.   Pt will benefit from skilled therapeutic intervention in order to improve on the following deficits Cardiopulmonary status limiting activity   Rehab Potential Good   PT  Frequency One time visit   PT Treatment/Interventions Patient/family education   PT Next Visit Plan None at this time.  May benefit from therapy to improve cardiovascular fitness if he is not successful doing tis independently.   PT Home Exercise Plan see education section   Consulted and Agree with Plan of Care Patient          G-Codes - March 25, 2015 1535  Functional Assessment Tool Used clinical judgement   Functional Limitation Mobility: Walking and moving around   Mobility: Walking and Moving Around Current Status [phone]) At least 20 percent but less than 40 percent impaired, limited or restricted   Mobility: Walking and Moving Around Goal Status [phone]) At least 20 percent but less than 40 percent impaired, limited or restricted   Mobility: Walking and Moving Around Discharge Status [phone]) At least 20 percent but less than 40 percent impaired, limited or restricted       Problem List Patient Active Problem List   Diagnosis Date Noted  . Lung cancer, right upper lobe 02/02/2015  . S/P lobectomy of lung 01/30/2015  . Pre-operative clearance 01/11/2015  . Essential hypertension   . Restless leg 12/20/2014  . Chronic obstructive pulmonary emphysema 12/20/2014  . 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 Fatty tumor 12/20/2014  . 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 Encounter for screening for malignant neoplasm of prostate 12/20/2014  . Chronic otitis externa 12/20/2014  . Abnormal finding on radiology exam 12/19/2014  . Coronary artery calcification seen on computed tomography 11/29/2014    Taysia Rivere 03/08/2015, 3:37 PM  [place], [place], [address] Phone: [phone]   Fax:  [place], PT 03/08/2015 3:37 PM

## 2015-03-08 NOTE — Progress Notes (Signed)
[place] Clinical Social Work  Clinical Social Work met with patient/family at [employer] appointment to offer support and assess for psychosocial needs.  Donald Delacruz was accompanied by his spouse.  Donald Delacruz shared he was "shocked" by learning he may need chemo treatment due to being "high risk".  Patient and spouse expressed "needing time to process this information".  CSW validated patients feelings and provided brief emotional support.  Clinical Social Work briefly discussed Clinical Social Work role and [employer] support programs/services.  Clinical Social Work encouraged patient to call with any additional questions or concerns.   [name], MSW, LCSW, OSW-C Clinical Social Worker [facility] [phone]

## 2015-03-14 ENCOUNTER — Telehealth: Payer: Self-pay | Admitting: *Deleted

## 2015-03-14 NOTE — Telephone Encounter (Signed)
Oncology Nurse Navigator Documentation  Oncology Nurse Navigator Flowsheets 03/14/2015  Navigator Encounter Type Telephone/patient called.  He has several questions about therapy.  I answered but he wanted to speak with Dr. Julien Nordmann again about therapy.  I will speak with Dr. Julien Nordmann about appt time and call tomorrow with more information.   Treatment Phase Abnormal Scans  Barriers/Navigation Needs Education  Education Other  Interventions Coordination of Care  Coordination of Care MD Appointments  Time Spent with Patient 30

## 2015-03-14 NOTE — Telephone Encounter (Signed)
Spoke with Dr. Julien Nordmann.  He stated he could see the patient on 03/16/15. I called patient to schedule at 9:45 on 03/16/15.  Patient verbalized understanding of appt.  I will notify schedulers to put on Dr. Worthy Flank calendar.

## 2015-03-15 ENCOUNTER — Other Ambulatory Visit: Payer: Self-pay | Admitting: Internal Medicine

## 2015-03-15 ENCOUNTER — Other Ambulatory Visit: Payer: Self-pay | Admitting: *Deleted

## 2015-03-15 ENCOUNTER — Encounter (HOSPITAL_COMMUNITY): Payer: Self-pay

## 2015-03-15 ENCOUNTER — Ambulatory Visit (HOSPITAL_COMMUNITY)
Admission: RE | Admit: 2015-03-15 | Discharge: 2015-03-15 | Disposition: A | Discharge status: Home / Self Care | Payer: Medicare Other | Source: Ambulatory Visit | Attending: Internal Medicine | Admitting: Internal Medicine

## 2015-03-15 ENCOUNTER — Encounter: Payer: Self-pay | Admitting: *Deleted

## 2015-03-15 DIAGNOSIS — C3411 Malignant neoplasm of upper lobe, right bronchus or lung: Secondary | ICD-10-CM

## 2015-03-15 DIAGNOSIS — C3490 Malignant neoplasm of unspecified part of unspecified bronchus or lung: Secondary | ICD-10-CM | POA: Insufficient documentation

## 2015-03-15 MED ORDER — IOHEXOL 300 MG/ML  SOLN
75.0000 mL | Freq: Once | INTRAMUSCULAR | Status: AC | PRN
  Administered 2015-03-15: 75 mL via INTRAVENOUS

## 2015-03-15 MED ORDER — IOHEXOL 300 MG/ML  SOLN: 80.0000 mL | Freq: Once | INTRAMUSCULAR | Status: DC | PRN

## 2015-03-15 NOTE — Progress Notes (Signed)
[facility] Clinical Social Work  Clinical Social Work received call from patient's son, Spurgeon Gancarz.  Patient's son shared concerns regarding patient and patient's spouse living independently.  Patient's son reported patient's spouse is in early stages of alzheimer's and patient is the primary caregiver. Patient's son had questions regarding treatment, CSW encouraged patient's son to attend chemotherapy class to better understand side effects and precautions.  CSW will send patient's son information on home care agencies if that may be of help in the future.  [name], MSW, LCSW, OSW-C Clinical Social Worker [facility] [phone]

## 2015-03-16 ENCOUNTER — Encounter: Payer: Self-pay | Admitting: *Deleted

## 2015-03-16 ENCOUNTER — Ambulatory Visit (HOSPITAL_BASED_OUTPATIENT_CLINIC_OR_DEPARTMENT_OTHER): Payer: Medicare Other | Admitting: Internal Medicine

## 2015-03-16 ENCOUNTER — Telehealth: Payer: Self-pay | Admitting: Internal Medicine

## 2015-03-16 ENCOUNTER — Telehealth: Payer: Self-pay | Admitting: *Deleted

## 2015-03-16 ENCOUNTER — Encounter: Payer: Self-pay | Admitting: Internal Medicine

## 2015-03-16 VITALS — BP 139/72 | HR 89 | Temp 97.7°F | Resp 18 | Ht 70.0 in | Wt 166.6 lb

## 2015-03-16 DIAGNOSIS — C3411 Malignant neoplasm of upper lobe, right bronchus or lung: Secondary | ICD-10-CM

## 2015-03-16 MED ORDER — CYANOCOBALAMIN 1000 MCG/ML IJ SOLN
1000.0000 ug | Freq: Once | INTRAMUSCULAR | Status: AC
  Administered 2015-03-16: 1000 ug via INTRAMUSCULAR

## 2015-03-16 NOTE — Progress Notes (Signed)
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  PRIOR THERAPY:  Status post right upper lobectomy with lymph node dissection on 01/30/2015. The Myriad lung cancer prognostic test showed high risk prognostic score of 31 and 25% risk of dying from lung cancer in 5 years. .  CURRENT THERAPY: Adjuvant systemic chemotherapy with carboplatin for AUC of 5 and Alimta 500 MG/M2 every C weeks. First dose expected 03/22/2015.  INTERVAL HISTORY: Donald Delacruz 74 y.o. male returns to the clinic today for follow-up visit accompanied by his wife. The patient is feeling fine today with no specific complaints. He denied having any significant chest pain, shortness of breath, cough or hemoptysis. He denied having any significant weight loss or night sweats. He has no nausea or vomiting. He had CT scan of the head performed recently and he is here for evaluation and discussion of his scan results and also more discussion of his adjuvant treatment options as the patient still has many questions.  MEDICAL HISTORY: Past Medical History  Diagnosis Date  . Essential hypertension   . ED (erectile dysfunction)   . Insomnia   . Lipoma   . Osteoarthritis   . Emphysema lung   . Restless legs syndrome   . Coronary artery calcification seen on computed tomography June 2016    CT scan of chest: Coronary artery calcifications are noted  . Thoracic aortic atherosclerosis June 2016    CT scan chest: Atherosclerosis of thoracic aorta is noted without aneurysm or dissection. Visualized portion of upper abdomen demonstrates stable calcified splenic artery and right renal  . 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  MEDICATIONS:  Current Outpatient Prescriptions  Medication Sig Dispense Refill  . aspirin EC 81 MG tablet Take 81 mg by mouth every morning.     . Calcium Carbonate-Vitamin D (CALCIUM 600+D) 600-400 MG-UNIT per tablet Take 1 tablet by mouth every morning.     . clobetasol ointment (TEMOVATE) 0.05 % Clobetasol Propionate 0.05 % External Ointment Apply to rash, and rub in well, twice a day as needed.  Quantity: 1;  Refills: 2   Wilson M.D., [employer] ;  Start 24-Apr-2010 Active 30 GM Tube    . gabapentin (NEURONTIN) 300 MG capsule Take 300-600 mg by mouth at bedtime.     [name] lisinopril (PRINIVIL,ZESTRIL) 20 MG tablet Take 20 mg by mouth every morning.     . meloxicam (MOBIC) 15 MG tablet Take 15 mg by mouth daily.    . metoprolol succinate (TOPROL-XL) 100 MG 24 hr tablet Take 100 mg by mouth every morning.     . Multiple Vitamin (MULTIVITAMIN) capsule Take 1 capsule by mouth every morning.     . Omega-3 Fatty Acids (FISH OIL) 1000 MG CAPS Take 1,000 mg by mouth daily.    . Turmeric 450 MG CAPS Take 500 mg by mouth every morning.     [name] acetaminophen (TYLENOL) 500 MG tablet Take 1,000 mg by mouth every 4 (four) hours as needed for moderate pain or headache.     . diltiazem (CARDIZEM CD) 180 MG 24 hr  capsule Take 180 mg by mouth every morning.     . sildenafil (VIAGRA) 100 MG tablet Take 100 mg by mouth daily as needed for erectile dysfunction.     . vardenafil (LEVITRA) 20 MG tablet Take 20 mg by mouth daily as needed for erectile dysfunction.      No current facility-administered medications for this visit.    SURGICAL HISTORY:  Past Surgical History  Procedure Laterality Date  . Cataract extraction    . Pilonidal cyst excision    . Video bronchoscopy N/A 01/30/2015    Procedure: VIDEO BRONCHOSCOPY;  Surgeon: [name], MD;  Location: [facility] OR;  Service: Thoracic;  Laterality: N/A;  .  Video assisted thoracoscopy (vats)/wedge resection Right 01/30/2015    Procedure: Right VIDEO ASSISTED THORACOSCOPY, Right upper Lobectomy with lymph node dissection and ONQ insertion;  Surgeon: [name], MD;  Location: [place];  Service: Thoracic;  Laterality: Right;    REVIEW OF SYSTEMS:  Constitutional: negative Eyes: negative Ears, nose, mouth, throat, and face: negative Respiratory: negative Cardiovascular: negative Gastrointestinal: negative Genitourinary:negative Integument/breast: negative Hematologic/lymphatic: negative Musculoskeletal:negative Neurological: negative Behavioral/Psych: negative Endocrine: negative Allergic/Immunologic: negative   PHYSICAL EXAMINATION: General appearance: alert, cooperative and no distress Head: Normocephalic, without obvious abnormality, atraumatic Neck: no adenopathy, no JVD, supple, symmetrical, trachea midline and thyroid not enlarged, symmetric, no tenderness/mass/nodules Lymph nodes: Cervical, supraclavicular, and axillary nodes normal. Resp: clear to auscultation bilaterally Back: symmetric, no curvature. ROM normal. No CVA tenderness. Cardio: regular rate and rhythm, S1, S2 normal, no murmur, click, rub or gallop GI: soft, non-tender; bowel sounds normal; no masses,  no organomegaly Extremities: extremities normal, atraumatic, no cyanosis or edema Neurologic: Alert and oriented X 3, normal strength and tone. Normal symmetric reflexes. Normal coordination and gait  ECOG PERFORMANCE STATUS: 0 - Asymptomatic  Blood pressure 139/72, pulse 89, temperature 97.7 F (36.5 C), temperature source Oral, resp. rate 18, height 5' 10"  (1.778 m), weight 166 lb 9.6 oz (75.569 kg), SpO2 95 %.  LABORATORY DATA: Lab Results  Component Value Date   WBC 3.9* 03/08/2015   HGB 11.3* 03/08/2015   HCT 34.0* 03/08/2015   MCV 95.1 03/08/2015   PLT 172 03/08/2015      Chemistry      Component Value Date/Time   NA 142 03/08/2015 1240   NA 137  02/03/2015 0236   K 4.6 03/08/2015 1240   K 4.4 02/03/2015 0236   CL 103 02/03/2015 0236   CO2 24 03/08/2015 1240   CO2 26 02/03/2015 0236   BUN 17.7 03/08/2015 1240   BUN 21* 02/03/2015 0236   CREATININE 1.0 03/08/2015 1240   CREATININE 1.25* 02/03/2015 0236      Component Value Date/Time   CALCIUM 9.4 03/08/2015 1240   CALCIUM 8.6* 02/03/2015 0236   ALKPHOS 61 03/08/2015 1240   ALKPHOS 33* 02/01/2015 0415   AST 16 03/08/2015 1240   AST 25 02/01/2015 0415   ALT 15 03/08/2015 1240   ALT 16* 02/01/2015 0415   BILITOT 0.49 03/08/2015 1240   BILITOT 0.4 02/01/2015 0415       RADIOGRAPHIC STUDIES: Dg Chest 2 View  02/22/2015   CLINICAL DATA:  Lung cancer.  EXAM: CHEST  2 VIEW  COMPARISON:  02/09/2015.  FINDINGS: Heart size normal.Deformity noted of the right upper lobe consistent with scarring. Right apical pneumothorax appears resolved. Interval resolution of right chest wall subcutaneous emphysema. No acute bony abnormality .  IMPRESSION: 1. Right apical pneumothorax appears resolved. Resolution of right chest  wall subcutaneous emphysema.  2.  Postsurgical changes right lung.  No acute abnormality.   Electronically Signed   By: Marcello Moores  Register   On: 02/22/2015 12:29   Ct Head W Wo Contrast  03/15/2015   CLINICAL DATA:  Lung cancer.  Staging.  EXAM: CT HEAD WITHOUT AND WITH CONTRAST  TECHNIQUE: Contiguous axial images were obtained from the base of the skull through the vertex without and with intravenous contrast  CONTRAST:  38m OMNIPAQUE IOHEXOL 300 MG/ML  SOLN  COMPARISON:  None.  FINDINGS: No evidence for acute infarction, hemorrhage, mass lesion, hydrocephalus, or extra-axial fluid. Mild cerebral and cerebellar atrophy. Post infusion, no abnormal enhancement of the brain or meninges. No osseous sclerotic or lytic lesions. No visible orbital or sinus pathology. Negative mastoids.  IMPRESSION: Chronic changes as described. No demonstrable intracranial metastatic disease.    Electronically Signed   By: JStaci RighterM.D.   On: 03/15/2015 15:10    ASSESSMENT AND PLAN: This is a very pleasant 74years old white male recently diagnosed with a stage IB non-small cell lung cancer, poorly differentiated adenocarcinoma diagnosed in August 2016, status post right upper lobectomy with lymph node dissection and myriad diagnostic tests showed high risk score was 25% risk of dying of lung cancer and 5 years. His recent CT scan of the head showed no evidence for metastatic disease to the brain. I discussed the scan results with the patient. The patient has several questions about adjuvant chemotherapy and the difference between treatment with cisplatin and carboplatin. I reviewed the adverse effect of this regimen of cisplatin and Alimta and carboplatin and Alimta with the patient and his wife. I explained to the patient that the standard of care is to cis-platinum regimen but the patient has a lot of concern about the toxicity of cisplatin and he preferred to proceed with adjuvant treatment with carboplatin and Alimta. I discussed with the patient adverse effects of this treatment including but not limited to alopecia, myelosuppression, nausea and vomiting, peripheral neuropathy, liver or renal dysfunction. We will arrange for the patient to receive vitamin B 12 injection today. The patient would also receive prescription for Compazine 10 mg by mouth every 6 hours as needed for nausea, Decadron 4 mg by mouth twice a day, the day before, day of and day after the chemotherapy in addition to folic acid 1 mg by mouth daily. I will arrange for the patient to have a chemotherapy education class before starting the first dose of the chemotherapy. He is expected to start the first cycle of this treatment next week. The patient would come back for follow-up visit in 2 weeks for reevaluation and management of any adverse effect of his treatment. He was advised to call immediately if he has any  concerning symptoms in the interval. The patient voices understanding of current disease status and treatment options and is in agreement with the current care plan.  All questions were answered. The patient knows to call the clinic with any problems, questions or concerns. We can certainly see the patient much sooner if necessary.  I spent 15 minutes counseling the patient face to face. The total time spent in the appointment was 25 minutes.  Disclaimer: This note was dictated with voice recognition software. Similar sounding words can inadvertently be transcribed and may not be corrected upon review.

## 2015-03-16 NOTE — Telephone Encounter (Signed)
per pof to sch pt appt-sent MW email to sch trmt-adv will call once reply

## 2015-03-16 NOTE — Progress Notes (Signed)
Oncology Nurse Navigator Documentation  Oncology Nurse Navigator Flowsheets 03/16/2015  Navigator Encounter Type Clinic/MDC;Other/spoke with patient and wife at today's visit to cancer center.  He has several questions about treatment.  All questions answered and explained.  I went over chemotherapy and pre-medication to help with chemo side effects.  I also wrote down the time to take steroids the day before tx, day of, and day after.    Patient Visit Type Follow-up  Treatment Phase Other  Barriers/Navigation Needs Education;Financial  Education Actor Options  Interventions Education Method  Coordination of Care -  Education Method Written;Verbal  Time Spent with Patient 30

## 2015-03-16 NOTE — Telephone Encounter (Signed)
Per staff message and POF I have scheduled appts. Advised scheduler of appts and to move labs. JMW  

## 2015-03-19 ENCOUNTER — Encounter: Payer: Self-pay | Admitting: *Deleted

## 2015-03-19 ENCOUNTER — Other Ambulatory Visit: Payer: Self-pay | Admitting: Internal Medicine

## 2015-03-19 ENCOUNTER — Telehealth: Payer: Self-pay | Admitting: *Deleted

## 2015-03-19 DIAGNOSIS — C3410 Malignant neoplasm of upper lobe, unspecified bronchus or lung: Secondary | ICD-10-CM

## 2015-03-19 MED ORDER — FOLIC ACID 1 MG PO TABS: 1.0000 mg | ORAL_TABLET | Freq: Every day | ORAL | Status: DC

## 2015-03-19 MED ORDER — DEXAMETHASONE 4 MG PO TABS: ORAL_TABLET | ORAL | Status: DC

## 2015-03-19 MED ORDER — PROCHLORPERAZINE MALEATE 10 MG PO TABS: 10.0000 mg | ORAL_TABLET | Freq: Four times a day (QID) | ORAL | Status: DC | PRN

## 2015-03-19 NOTE — Telephone Encounter (Signed)
Oncology Nurse Navigator Documentation  Oncology Nurse Navigator Flowsheets 03/19/2015  Navigator Encounter Type Telephone/patient called and had questions about premedications for chemotherapy.  I reviewed with him.  He verbalized understanding   Patient Visit Type Follow-up  Treatment Phase Other  Barriers/Navigation Needs Education  Education Understanding Cancer/ Treatment Options  Interventions Education   Coordination of Care Understanding medications  Education Method Verbal  Time Spent with Patient 30

## 2015-03-21 ENCOUNTER — Encounter: Payer: Self-pay | Admitting: *Deleted

## 2015-03-21 ENCOUNTER — Encounter: Payer: Self-pay | Admitting: Internal Medicine

## 2015-03-21 ENCOUNTER — Other Ambulatory Visit: Payer: Medicare Other

## 2015-03-21 NOTE — Progress Notes (Signed)
Called pt and left voicemail to introduce myself and obtain information for possible copay assistance if interested. Treatment starts tomorrow 03/23/2015 Left voicemail.

## 2015-03-22 ENCOUNTER — Other Ambulatory Visit: Payer: Medicare Other

## 2015-03-22 ENCOUNTER — Other Ambulatory Visit (HOSPITAL_BASED_OUTPATIENT_CLINIC_OR_DEPARTMENT_OTHER): Payer: Medicare Other

## 2015-03-22 ENCOUNTER — Ambulatory Visit (HOSPITAL_BASED_OUTPATIENT_CLINIC_OR_DEPARTMENT_OTHER): Payer: Medicare Other

## 2015-03-22 VITALS — BP 159/70 | HR 82 | Temp 97.7°F | Resp 18

## 2015-03-22 DIAGNOSIS — Z5111 Encounter for antineoplastic chemotherapy: Secondary | ICD-10-CM

## 2015-03-22 DIAGNOSIS — C3411 Malignant neoplasm of upper lobe, right bronchus or lung: Secondary | ICD-10-CM

## 2015-03-22 LAB — COMPREHENSIVE METABOLIC PANEL (CC13)
ALT: 16 U/L (ref 0–55)
ANION GAP: 8 meq/L (ref 3–11)
AST: 15 U/L (ref 5–34)
Albumin: 3.8 g/dL (ref 3.5–5.0)
Alkaline Phosphatase: 51 U/L (ref 40–150)
BUN: 30.4 mg/dL — ABNORMAL HIGH (ref 7.0–26.0)
CALCIUM: 9.6 mg/dL (ref 8.4–10.4)
CHLORIDE: 110 meq/L — AB (ref 98–109)
CO2: 23 meq/L (ref 22–29)
CREATININE: 1.1 mg/dL (ref 0.7–1.3)
EGFR: 63 mL/min/{1.73_m2} — AB (ref >90)
Glucose: 141 mg/dl — ABNORMAL HIGH (ref 70–140)
POTASSIUM: 5 meq/L (ref 3.5–5.1)
Sodium: 142 mEq/L (ref 136–145)
Total Bilirubin: 0.41 mg/dL (ref 0.20–1.20)
Total Protein: 6.6 g/dL (ref 6.4–8.3)

## 2015-03-22 LAB — CBC WITH DIFFERENTIAL/PLATELET
BASO%: 0 % (ref 0.0–2.0)
BASOS ABS: 0 10*3/uL (ref 0.0–0.1)
EOS%: 0 % (ref 0.0–7.0)
Eosinophils Absolute: 0 10*3/uL (ref 0.0–0.5)
HEMATOCRIT: 35.6 % — AB (ref 38.4–49.9)
HGB: 11.9 g/dL — ABNORMAL LOW (ref 13.0–17.1)
LYMPH#: 0.6 10*3/uL — AB (ref 0.9–3.3)
LYMPH%: 9.4 % — AB (ref 14.0–49.0)
MCH: 32 pg (ref 27.2–33.4)
MCHC: 33.4 g/dL (ref 32.0–36.0)
MCV: 95.7 fL (ref 79.3–98.0)
MONO#: 0.3 10*3/uL (ref 0.1–0.9)
MONO%: 4.8 % (ref 0.0–14.0)
NEUT#: 5.8 10*3/uL (ref 1.5–6.5)
NEUT%: 85.8 % — AB (ref 39.0–75.0)
PLATELETS: 254 10*3/uL (ref 140–400)
RBC: 3.72 10*6/uL — AB (ref 4.20–5.82)
RDW: 15.1 % — ABNORMAL HIGH (ref 11.0–14.6)
WBC: 6.8 10*3/uL (ref 4.0–10.3)

## 2015-03-22 MED ORDER — CARBOPLATIN CHEMO INJECTION 600 MG/60ML
471.5000 mg | Freq: Once | INTRAVENOUS | Status: AC
  Administered 2015-03-22: 470 mg via INTRAVENOUS
  Filled 2015-03-22: qty 47

## 2015-03-22 MED ORDER — SODIUM CHLORIDE 0.9 % IV SOLN
500.0000 mg/m2 | Freq: Once | INTRAVENOUS | Status: AC
  Administered 2015-03-22: 975 mg via INTRAVENOUS
  Filled 2015-03-22: qty 39

## 2015-03-22 MED ORDER — SODIUM CHLORIDE 0.9 % IV SOLN
Freq: Once | INTRAVENOUS | Status: AC
  Administered 2015-03-22: 15:00:00 via INTRAVENOUS

## 2015-03-22 MED ORDER — SODIUM CHLORIDE 0.9 % IV SOLN
Freq: Once | INTRAVENOUS | Status: AC
  Administered 2015-03-22: 15:00:00 via INTRAVENOUS
  Filled 2015-03-22: qty 8

## 2015-03-22 NOTE — Patient Instructions (Signed)
[facility] Discharge Instructions for Patients Receiving Chemotherapy  Today you received the following chemotherapy agents: Alimta/Carboplatin  To help prevent nausea and vomiting after your treatment, we encourage you to take your nausea medication as directed.   If you develop nausea and vomiting that is not controlled by your nausea medication, call the clinic.   BELOW ARE SYMPTOMS THAT SHOULD BE REPORTED IMMEDIATELY:  *FEVER GREATER THAN 100.5 F  *CHILLS WITH OR WITHOUT FEVER  NAUSEA AND VOMITING THAT IS NOT CONTROLLED WITH YOUR NAUSEA MEDICATION  *UNUSUAL SHORTNESS OF BREATH  *UNUSUAL BRUISING OR BLEEDING  TENDERNESS IN MOUTH AND THROAT WITH OR WITHOUT PRESENCE OF ULCERS  *URINARY PROBLEMS  *BOWEL PROBLEMS  UNUSUAL RASH Items with * indicate a potential emergency and should be followed up as soon as possible.  Feel free to call the clinic you have any questions or concerns. The clinic phone number is [phone].  Please show the CHEMO ALERT CARD at check-in to the Emergency Department and triage nurse.   

## 2015-03-23 ENCOUNTER — Telehealth: Payer: Self-pay | Admitting: Medical Oncology

## 2015-03-23 NOTE — Telephone Encounter (Signed)
-----   Message from Otila Kluver, RN sent at 03/22/2015  4:34 PM EDT ----- Regarding: Donald Delacruz  1st time chemo Contact: [phone] 1st time chemo with Alimta and carboplatin. No evidence of reaction during treatment.

## 2015-03-23 NOTE — Telephone Encounter (Signed)
He is doing well today . He " read the chemo question and answer book and it helped answer a lo of my questions."

## 2015-03-28 ENCOUNTER — Other Ambulatory Visit: Payer: Self-pay | Admitting: Cardiothoracic Surgery

## 2015-03-28 DIAGNOSIS — C3410 Malignant neoplasm of upper lobe, unspecified bronchus or lung: Secondary | ICD-10-CM

## 2015-03-29 ENCOUNTER — Ambulatory Visit
Admission: RE | Admit: 2015-03-29 | Discharge: 2015-03-29 | Disposition: A | Discharge status: Home / Self Care | Payer: Medicare Other | Source: Ambulatory Visit | Attending: Cardiothoracic Surgery | Admitting: Cardiothoracic Surgery

## 2015-03-29 ENCOUNTER — Ambulatory Visit (HOSPITAL_BASED_OUTPATIENT_CLINIC_OR_DEPARTMENT_OTHER): Payer: Medicare Other | Admitting: Nurse Practitioner

## 2015-03-29 ENCOUNTER — Encounter: Payer: Self-pay | Admitting: Cardiothoracic Surgery

## 2015-03-29 ENCOUNTER — Telehealth: Payer: Self-pay | Admitting: Nurse Practitioner

## 2015-03-29 ENCOUNTER — Other Ambulatory Visit (HOSPITAL_BASED_OUTPATIENT_CLINIC_OR_DEPARTMENT_OTHER): Payer: Medicare Other

## 2015-03-29 ENCOUNTER — Ambulatory Visit (INDEPENDENT_AMBULATORY_CARE_PROVIDER_SITE_OTHER): Payer: Self-pay | Admitting: Cardiothoracic Surgery

## 2015-03-29 VITALS — BP 135/86 | HR 90 | Resp 20 | Ht 70.0 in | Wt 166.0 lb

## 2015-03-29 VITALS — BP 143/70 | HR 84 | Temp 97.8°F | Resp 18 | Ht 70.0 in | Wt 165.0 lb

## 2015-03-29 DIAGNOSIS — C3411 Malignant neoplasm of upper lobe, right bronchus or lung: Secondary | ICD-10-CM | POA: Diagnosis not present

## 2015-03-29 DIAGNOSIS — C3410 Malignant neoplasm of upper lobe, unspecified bronchus or lung: Secondary | ICD-10-CM

## 2015-03-29 DIAGNOSIS — Z902 Acquired absence of lung [part of]: Secondary | ICD-10-CM

## 2015-03-29 DIAGNOSIS — Z9889 Other specified postprocedural states: Secondary | ICD-10-CM

## 2015-03-29 LAB — CBC WITH DIFFERENTIAL/PLATELET
BASO%: 1.3 % (ref 0.0–2.0)
Basophils Absolute: 0 10*3/uL (ref 0.0–0.1)
EOS%: 2.2 % (ref 0.0–7.0)
Eosinophils Absolute: 0 10*3/uL (ref 0.0–0.5)
HCT: 35.6 % — ABNORMAL LOW (ref 38.4–49.9)
HEMOGLOBIN: 11.8 g/dL — AB (ref 13.0–17.1)
LYMPH%: 40.5 % (ref 14.0–49.0)
MCH: 32 pg (ref 27.2–33.4)
MCHC: 33.2 g/dL (ref 32.0–36.0)
MCV: 96.3 fL (ref 79.3–98.0)
MONO#: 0 10*3/uL — ABNORMAL LOW (ref 0.1–0.9)
MONO%: 2.8 % (ref 0.0–14.0)
NEUT%: 53.2 % (ref 39.0–75.0)
NEUTROS ABS: 0.9 10*3/uL — AB (ref 1.5–6.5)
Platelets: 133 10*3/uL — ABNORMAL LOW (ref 140–400)
RBC: 3.7 10*6/uL — ABNORMAL LOW (ref 4.20–5.82)
RDW: 14.6 % (ref 11.0–14.6)
WBC: 1.6 10*3/uL — AB (ref 4.0–10.3)
lymph#: 0.7 10*3/uL — ABNORMAL LOW (ref 0.9–3.3)

## 2015-03-29 LAB — COMPREHENSIVE METABOLIC PANEL (CC13)
ALBUMIN: 3.5 g/dL (ref 3.5–5.0)
ALK PHOS: 48 U/L (ref 40–150)
ALT: 16 U/L (ref 0–55)
AST: 18 U/L (ref 5–34)
Anion Gap: 6 mEq/L (ref 3–11)
BUN: 25.7 mg/dL (ref 7.0–26.0)
CO2: 24 mEq/L (ref 22–29)
Calcium: 9.1 mg/dL (ref 8.4–10.4)
Chloride: 106 mEq/L (ref 98–109)
Creatinine: 0.9 mg/dL (ref 0.7–1.3)
EGFR: 80 mL/min/{1.73_m2} — ABNORMAL LOW (ref >90)
GLUCOSE: 126 mg/dL (ref 70–140)
POTASSIUM: 4.9 meq/L (ref 3.5–5.1)
SODIUM: 137 meq/L (ref 136–145)
TOTAL PROTEIN: 6.2 g/dL — AB (ref 6.4–8.3)
Total Bilirubin: 0.52 mg/dL (ref 0.20–1.20)

## 2015-03-29 NOTE — Progress Notes (Signed)
  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  PRIOR THERAPY: Status post right upper lobectomy with lymph node dissection on 01/30/2015. The Myriad lung cancer prognostic test showed high risk prognostic score of 31 and 25% risk of dying from lung cancer in 5 years. .  CURRENT THERAPY: Adjuvant systemic chemotherapy with carboplatin for AUC of 5 and Alimta 500 MG/M2 every 3 weeks. First dose 03/22/2015.    INTERVAL HISTORY:   Donald Delacruz returns as scheduled. He completed cycle 1 adjuvant carboplatin/Alimta 03/22/2015. He denies nausea/vomiting. No mouth sores. No diarrhea. No rash. He has stable dyspnea. 3 days ago he felt hot and checked his temperature with a reading of 100.1. He took Tylenol. Temperature has since been normal. No shaking chills.  Objective:  Vital signs in last 24 hours:  Blood pressure 143/70, pulse 84, temperature 97.8 F (36.6 C), temperature source Oral, resp. rate 18, height _0  (1.778 m), weight 165 lb (74.844 kg), SpO2 100 %.    HEENT: No thrush or ulcers. Resp: Lungs clear bilaterally. Cardio: Regular rate and rhythm. GI: Abdomen soft and nontender. No hepatomegaly. Vascular: No leg edema. Calves soft and nontender.   Lab Results:  Lab Results  Component Value Date   WBC 1.6* 03/29/2015   HGB 11.8* 03/29/2015   HCT 35.6* 03/29/2015   MCV 96.3 03/29/2015   PLT 133* 03/29/2015   NEUTROABS 0.9* 03/29/2015    Imaging:  Dg Chest 2 View  03/29/2015   CLINICAL DATA:  74 year old male with history of right upper lobectomy on 01/30/2015 presenting with shortness of breath, primarily during exertion.  EXAM: CHEST  2 VIEW  COMPARISON:  Chest x-ray 02/22/2015.  FINDINGS: Postoperative changes of right upper lobectomy are noted. Compensatory hyperexpansion of the right middle and  lower lobes. No suspicious appearing pulmonary nodules or masses. No acute consolidative airspace disease. No pneumothorax. No pleural effusions. No evidence of pulmonary edema. Heart size and mediastinal contours are within normal limits. Atherosclerosis in the thoracic aorta. Prominent calcifications/ossification along the inferior aspect of the right first rib, chronic and similar to numerous prior examinations.  IMPRESSION: 1. Status post right upper lobectomy. No acute findings and no findings to suggest recurrent disease on today's plain film examination. 2. Atherosclerosis.   Electronically Signed   By: Vinnie Langton M.D.   On: 03/29/2015 12:20    Medications: I have reviewed the patient's current medications.  Assessment/Plan: 1. Stage IB (T2a, N0, M0) non-small cell lung cancer, moderate to poorly differentiated adenocarcinoma involving the right upper lobe diagnosed in August 2016 status post right upper lobectomy with lymph node dissection on 01/30/2015. Cycle 1 adjuvant carboplatin/Alimta 03/22/2015.   Disposition: Donald Delacruz appears stable. He has completed 1 cycle of adjuvant carboplatin/Alimta. He is mildly neutropenic. He will return in one week for a follow-up CBC. I reviewed neutropenic precautions with him and his wife. They understand to contact the office with high fever, shaking chills, other signs of infection. They expressed their understanding. He will return for a follow-up visit and cycle 2 carboplatin/Alimta in 2 weeks.  Plan reviewed with Dr. Julien Nordmann.    Ned Card ANP/GNP-BC   03/29/2015  3:28 PM

## 2015-03-29 NOTE — Progress Notes (Signed)
[address]       ,[place] [address]             [id number]                  Shahil Hereford [place] Medical Record #[id number] Date of Birth: 01-08-1941  Referring JH:ERDEYC, Jama Flavors, MD Primary Cardiology: Primary Care:WILSON,FRED Mallie Mussel, MD  Chief Complaint:  Follow Up Visit 01/30/2015  OPERATIVE REPORT PREOPERATIVE DIAGNOSIS: Adenocarcinoma, right upper lobe. POSTOPERATIVE DIAGNOSIS: Adenocarcinoma, right upper lobe. SURGICAL PROCEDURE: Video bronchoscopy, right video-assisted thoracoscopy with right upper lobectomy, lymph node dissection, and placement of On-Q. SURGEON: Lanelle Bal, MD  Lung cancer, right upper lobe   Staging form: Lung, AJCC 7th Edition     Clinical stage from 01/08/2015: Stage IIB (T3(2), N0, M0) - Signed by [name], MD on 02/02/2015     Pathologic stage from 02/02/2015: Stage IB (T2a, N0, cM0) - Signed by [name], MD on 02/02/2015  Myriad Prognostic score 31  -  25% 5 year mortality  CURRENT THERAPY: Adjuvant systemic chemotherapy with carboplatin for AUC of 5 and Alimta 500 MG/M2 every C weeks. First dose expected 03/22/2015.   History of Present Illness:     Patient is making reasonable progress following recent right upper lobectomy. Early postoperatively he developed ankle edema and painful right foot and was seen by orthopedics. An inflammatory is has improved this and he is ambulating without walker or cane. The patient is returned to near normal activities he's had no further difficulty with swelling in his feet and ankles. He denies any shortness of breath. He started chemotherapy last week without incident   Zubrod Score: At the time of surgery this patient's most appropriate activity status/level should be described as: []     0    Normal activity, no symptoms [x]     1    Restricted in physical strenuous activity but ambulatory, able to do out light work []     2    Ambulatory and capable of  self care, unable to do work activities, up and about                 >50 % of waking hours                                                                                   []     3    Only limited self care, in bed greater than 50% of waking hours []     4    Completely disabled, no self care, confined to bed or chair []     5    Moribund  History  Smoking status  . Former Smoker -- 1.00 packs/day for 39 years  . Types: Cigarettes  . Quit date: 06/30/1997  Smokeless tobacco  . Never Used    Comment: SMOKED PIPES ALSO       Allergies  Allergen Reactions  . Amlodipine Swelling  . Benicar [Olmesartan]     Dizziness,malaise   . Nisoldipine Swelling    Current Outpatient Prescriptions  Medication Sig Dispense Refill  . acetaminophen (TYLENOL) 500 MG tablet Take  1,000 mg by mouth every 4 (four) hours as needed for moderate pain or headache.     [name] aspirin EC 81 MG tablet Take 81 mg by mouth every morning.     . Calcium Carbonate-Vitamin D (CALCIUM 600+D) 600-400 MG-UNIT per tablet Take 1 tablet by mouth every morning.     . clobetasol ointment (TEMOVATE) 0.05 % Clobetasol Propionate 0.05 % External Ointment Apply to rash, and rub in well, twice a day as needed.  Quantity: 1;  Refills: 2   Wilson M.D., Jama Flavors ;  Start 24-Apr-2010 Active 30 GM Tube    . dexamethasone (DECADRON) 4 MG tablet 4 mg by mouth twice a day the day before, day of and day after the chemotherapy every 3 weeks. 40 tablet 0  . folic acid (FOLVITE) 1 MG tablet Take 1 tablet (1 mg total) by mouth daily. 30 tablet 2  . gabapentin (NEURONTIN) 300 MG capsule Take 300-600 mg by mouth at bedtime.     [name] lisinopril (PRINIVIL,ZESTRIL) 20 MG tablet Take 20 mg by mouth every morning.     . meloxicam (MOBIC) 15 MG tablet Take 15 mg by mouth daily.    . metoprolol succinate (TOPROL-XL) 100 MG 24 hr tablet Take 100 mg by mouth every morning.     . Multiple Vitamin (MULTIVITAMIN) capsule Take 1 capsule by mouth every morning.     .  Omega-3 Fatty Acids (FISH OIL) 1000 MG CAPS Take 1,000 mg by mouth daily.    . prochlorperazine (COMPAZINE) 10 MG tablet Take 1 tablet (10 mg total) by mouth every 6 (six) hours as needed for nausea or vomiting. 60 tablet 0  . sildenafil (VIAGRA) 100 MG tablet Take 100 mg by mouth daily as needed for erectile dysfunction.     . Turmeric 450 MG CAPS Take 500 mg by mouth every morning.     . vardenafil (LEVITRA) 20 MG tablet Take 20 mg by mouth daily as needed for erectile dysfunction.     [name] diltiazem (CARDIZEM CD) 180 MG 24 hr capsule Take 180 mg by mouth every morning.      No current facility-administered medications for this visit.       Physical Exam: BP 135/86 mmHg  Pulse 90  Resp 20  Ht 5' 10"  (1.778 m)  Wt 166 lb (75.297 kg)  BMI 23.82 kg/m2  SpO2 96%  General appearance: alert and cooperative Neurologic: intact Heart: regular rate and rhythm, S1, S2 normal, no murmur, click, rub or gallop Lungs: clear to auscultation bilaterally Abdomen: soft, non-tender; bowel sounds normal; no masses,  no organomegaly Extremities: extremities normal, atraumatic, no cyanosis or edema and Homans sign is negative, no sign of DVT Wound: Right chest incisions and chest tube sites are healing well without evidence of infection   Diagnostic Studies & Laboratory data:         Recent Radiology Findings: Dg Chest 2 View  03/29/2015   CLINICAL DATA:  74 year old male with history of right upper lobectomy on 01/30/2015 presenting with shortness of breath, primarily during exertion.  EXAM: CHEST  2 VIEW  COMPARISON:  Chest x-ray 02/22/2015.  FINDINGS: Postoperative changes of right upper lobectomy are noted. Compensatory hyperexpansion of the right middle and lower lobes. No suspicious appearing pulmonary nodules or masses. No acute consolidative airspace disease. No pneumothorax. No pleural effusions. No evidence of pulmonary edema. Heart size and mediastinal contours are within normal limits.  Atherosclerosis in the thoracic aorta. Prominent calcifications/ossification along the inferior aspect of  the right first rib, chronic and similar to numerous prior examinations.  IMPRESSION: 1. Status post right upper lobectomy. No acute findings and no findings to suggest recurrent disease on today's plain film examination. 2. Atherosclerosis.   Electronically Signed   By: Vinnie Langton M.D.   On: 03/29/2015 12:20      I have independently reviewed the above radiology findings and reviewed findings  with the patient.  Recent Labs: Lab Results  Component Value Date   WBC 6.8 03/22/2015   HGB 11.9* 03/22/2015   HCT 35.6* 03/22/2015   PLT 254 03/22/2015   GLUCOSE 141* 03/22/2015   ALT 16 03/22/2015   AST 15 03/22/2015   NA 142 03/22/2015   K 5.0 03/22/2015   CL 103 02/03/2015   CREATININE 1.1 03/22/2015   BUN 30.4* 03/22/2015   CO2 23 03/22/2015   INR 0.99 01/26/2015      Assessment / Plan:   Patient stable early postoperatively taking continued improvement in physical activity. Doing well postoperatively, and has tolerated his first round of chemotherapy Plan to see him back in 3 months with a follow-up chest x-ray    [name] 03/29/2015 1:09 PM

## 2015-03-29 NOTE — Telephone Encounter (Signed)
per pof to sch pt appt-gave pt copy of avs-sent MW email to move MD appt-pt to get updated sch 10/6

## 2015-03-30 ENCOUNTER — Telehealth: Payer: Self-pay | Admitting: Internal Medicine

## 2015-03-30 NOTE — Telephone Encounter (Signed)
Per staff msg added Alimta/Carbo to schedule... KJ

## 2015-04-05 ENCOUNTER — Telehealth: Payer: Self-pay | Admitting: Medical Oncology

## 2015-04-05 ENCOUNTER — Other Ambulatory Visit (HOSPITAL_BASED_OUTPATIENT_CLINIC_OR_DEPARTMENT_OTHER): Payer: Medicare Other

## 2015-04-05 ENCOUNTER — Ambulatory Visit: Payer: Medicare Other

## 2015-04-05 ENCOUNTER — Other Ambulatory Visit: Payer: Self-pay | Admitting: Medical Oncology

## 2015-04-05 ENCOUNTER — Telehealth: Payer: Self-pay | Admitting: Internal Medicine

## 2015-04-05 ENCOUNTER — Ambulatory Visit (HOSPITAL_BASED_OUTPATIENT_CLINIC_OR_DEPARTMENT_OTHER): Payer: Medicare Other

## 2015-04-05 DIAGNOSIS — C3411 Malignant neoplasm of upper lobe, right bronchus or lung: Secondary | ICD-10-CM

## 2015-04-05 DIAGNOSIS — D709 Neutropenia, unspecified: Secondary | ICD-10-CM | POA: Diagnosis not present

## 2015-04-05 DIAGNOSIS — D702 Other drug-induced agranulocytosis: Secondary | ICD-10-CM

## 2015-04-05 LAB — CBC WITH DIFFERENTIAL/PLATELET
BASO%: 0.7 % (ref 0.0–2.0)
BASOS ABS: 0 10*3/uL (ref 0.0–0.1)
EOS%: 0.7 % (ref 0.0–7.0)
Eosinophils Absolute: 0 10*3/uL (ref 0.0–0.5)
HCT: 33.2 % — ABNORMAL LOW (ref 38.4–49.9)
HGB: 11.4 g/dL — ABNORMAL LOW (ref 13.0–17.1)
LYMPH#: 0.9 10*3/uL (ref 0.9–3.3)
LYMPH%: 66 % — AB (ref 14.0–49.0)
MCH: 32 pg (ref 27.2–33.4)
MCHC: 34.3 g/dL (ref 32.0–36.0)
MCV: 93.3 fL (ref 79.3–98.0)
MONO#: 0.3 10*3/uL (ref 0.1–0.9)
MONO%: 21.3 % — ABNORMAL HIGH (ref 0.0–14.0)
NEUT#: 0.2 10*3/uL — CL (ref 1.5–6.5)
NEUT%: 11.3 % — AB (ref 39.0–75.0)
PLATELETS: 60 10*3/uL — AB (ref 140–400)
RBC: 3.56 10*6/uL — AB (ref 4.20–5.82)
RDW: 13.7 % (ref 11.0–14.6)
WBC: 1.4 10*3/uL — ABNORMAL LOW (ref 4.0–10.3)

## 2015-04-05 LAB — COMPREHENSIVE METABOLIC PANEL (CC13)
ALT: 18 U/L (ref 0–55)
ANION GAP: 8 meq/L (ref 3–11)
AST: 20 U/L (ref 5–34)
Albumin: 3.6 g/dL (ref 3.5–5.0)
Alkaline Phosphatase: 51 U/L (ref 40–150)
BUN: 16.4 mg/dL (ref 7.0–26.0)
CALCIUM: 9.2 mg/dL (ref 8.4–10.4)
CHLORIDE: 110 meq/L — AB (ref 98–109)
CO2: 23 meq/L (ref 22–29)
Creatinine: 0.8 mg/dL (ref 0.7–1.3)
EGFR: 86 mL/min/{1.73_m2} — ABNORMAL LOW (ref >90)
Glucose: 99 mg/dl (ref 70–140)
POTASSIUM: 5 meq/L (ref 3.5–5.1)
Sodium: 141 mEq/L (ref 136–145)
Total Bilirubin: 0.37 mg/dL (ref 0.20–1.20)
Total Protein: 6.3 g/dL — ABNORMAL LOW (ref 6.4–8.3)

## 2015-04-05 MED ORDER — TBO-FILGRASTIM 300 MCG/0.5ML ~~LOC~~ SOSY
300.0000 ug | PREFILLED_SYRINGE | Freq: Once | SUBCUTANEOUS | Status: AC
  Administered 2015-04-05: 300 ug via SUBCUTANEOUS
  Filled 2015-04-05: qty 0.5

## 2015-04-05 NOTE — Telephone Encounter (Signed)
I left message for pt to expect call from scheduling  to come back to cancer center today for injection. Onc Tx request sent

## 2015-04-05 NOTE — Progress Notes (Signed)
Discussed granix possible SE of mild body and bone aches, low grade fever about 99.0. May use OTC analgesic if needed. Discussed staying in lobby after lab draws in the future to see if needs injection to save on driving.

## 2015-04-05 NOTE — Telephone Encounter (Signed)
GAVE AND PRINTED APPT SCHED FOR oct AND nov

## 2015-04-12 ENCOUNTER — Other Ambulatory Visit: Payer: Medicare Other

## 2015-04-12 ENCOUNTER — Encounter: Payer: Self-pay | Admitting: Internal Medicine

## 2015-04-12 ENCOUNTER — Ambulatory Visit (HOSPITAL_BASED_OUTPATIENT_CLINIC_OR_DEPARTMENT_OTHER): Payer: Medicare Other

## 2015-04-12 ENCOUNTER — Ambulatory Visit (HOSPITAL_BASED_OUTPATIENT_CLINIC_OR_DEPARTMENT_OTHER): Payer: Medicare Other | Admitting: Internal Medicine

## 2015-04-12 ENCOUNTER — Other Ambulatory Visit (HOSPITAL_BASED_OUTPATIENT_CLINIC_OR_DEPARTMENT_OTHER): Payer: Medicare Other

## 2015-04-12 VITALS — BP 153/76 | HR 72 | Resp 18 | Ht 70.0 in | Wt 168.7 lb

## 2015-04-12 DIAGNOSIS — C3411 Malignant neoplasm of upper lobe, right bronchus or lung: Secondary | ICD-10-CM

## 2015-04-12 DIAGNOSIS — Z5111 Encounter for antineoplastic chemotherapy: Secondary | ICD-10-CM

## 2015-04-12 LAB — COMPREHENSIVE METABOLIC PANEL (CC13)
ALBUMIN: 4 g/dL (ref 3.5–5.0)
ALK PHOS: 54 U/L (ref 40–150)
ALT: 18 U/L (ref 0–55)
ANION GAP: 10 meq/L (ref 3–11)
AST: 19 U/L (ref 5–34)
BUN: 24 mg/dL (ref 7.0–26.0)
CALCIUM: 9.7 mg/dL (ref 8.4–10.4)
CHLORIDE: 110 meq/L — AB (ref 98–109)
CO2: 20 mEq/L — ABNORMAL LOW (ref 22–29)
CREATININE: 1 mg/dL (ref 0.7–1.3)
EGFR: 70 mL/min/{1.73_m2} — ABNORMAL LOW (ref >90)
Glucose: 121 mg/dl (ref 70–140)
POTASSIUM: 4.9 meq/L (ref 3.5–5.1)
Sodium: 140 mEq/L (ref 136–145)
Total Bilirubin: 0.33 mg/dL (ref 0.20–1.20)
Total Protein: 6.9 g/dL (ref 6.4–8.3)

## 2015-04-12 LAB — CBC WITH DIFFERENTIAL/PLATELET
BASO%: 0.7 % (ref 0.0–2.0)
Basophils Absolute: 0.1 10*3/uL (ref 0.0–0.1)
EOS%: 0 % (ref 0.0–7.0)
Eosinophils Absolute: 0 10*3/uL (ref 0.0–0.5)
HCT: 36.3 % — ABNORMAL LOW (ref 38.4–49.9)
HGB: 12.1 g/dL — ABNORMAL LOW (ref 13.0–17.1)
LYMPH%: 7.1 % — ABNORMAL LOW (ref 14.0–49.0)
MCH: 31.9 pg (ref 27.2–33.4)
MCHC: 33.4 g/dL (ref 32.0–36.0)
MCV: 95.7 fL (ref 79.3–98.0)
MONO#: 0.8 10*3/uL (ref 0.1–0.9)
MONO%: 10.6 % (ref 0.0–14.0)
NEUT#: 5.9 10*3/uL (ref 1.5–6.5)
NEUT%: 81.6 % — ABNORMAL HIGH (ref 39.0–75.0)
Platelets: 394 10*3/uL (ref 140–400)
RBC: 3.79 10*6/uL — ABNORMAL LOW (ref 4.20–5.82)
RDW: 15 % — ABNORMAL HIGH (ref 11.0–14.6)
WBC: 7.3 10*3/uL (ref 4.0–10.3)
lymph#: 0.5 10*3/uL — ABNORMAL LOW (ref 0.9–3.3)

## 2015-04-12 MED ORDER — SODIUM CHLORIDE 0.9 % IV SOLN
470.0000 mg | Freq: Once | INTRAVENOUS | Status: AC
  Administered 2015-04-12: 470 mg via INTRAVENOUS
  Filled 2015-04-12: qty 47

## 2015-04-12 MED ORDER — SODIUM CHLORIDE 0.9 % IV SOLN
512.0000 mg/m2 | Freq: Once | INTRAVENOUS | Status: AC
  Administered 2015-04-12: 1000 mg via INTRAVENOUS
  Filled 2015-04-12: qty 40

## 2015-04-12 MED ORDER — SODIUM CHLORIDE 0.9 % IV SOLN
Freq: Once | INTRAVENOUS | Status: AC
  Administered 2015-04-12: 14:00:00 via INTRAVENOUS

## 2015-04-12 MED ORDER — DEXAMETHASONE SODIUM PHOSPHATE 100 MG/10ML IJ SOLN
Freq: Once | INTRAMUSCULAR | Status: AC
  Administered 2015-04-12: 14:00:00 via INTRAVENOUS
  Filled 2015-04-12: qty 8

## 2015-04-12 NOTE — Patient Instructions (Signed)
[facility] Discharge Instructions for Patients Receiving Chemotherapy  Today you received the following chemotherapy agents:  Alimta and Carboplatin  To help prevent nausea and vomiting after your treatment, we encourage you to take your nausea medication as ordered per MD.   If you develop nausea and vomiting that is not controlled by your nausea medication, call the clinic.   BELOW ARE SYMPTOMS THAT SHOULD BE REPORTED IMMEDIATELY:  *FEVER GREATER THAN 100.5 F  *CHILLS WITH OR WITHOUT FEVER  NAUSEA AND VOMITING THAT IS NOT CONTROLLED WITH YOUR NAUSEA MEDICATION  *UNUSUAL SHORTNESS OF BREATH  *UNUSUAL BRUISING OR BLEEDING  TENDERNESS IN MOUTH AND THROAT WITH OR WITHOUT PRESENCE OF ULCERS  *URINARY PROBLEMS  *BOWEL PROBLEMS  UNUSUAL RASH Items with * indicate a potential emergency and should be followed up as soon as possible.  Feel free to call the clinic you have any questions or concerns. The clinic phone number is [phone].  Please show the [place] at check-in to the Emergency Department and triage nurse.

## 2015-04-12 NOTE — Progress Notes (Signed)
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  PRIOR THERAPY:  Status post right upper lobectomy with lymph node dissection on 01/30/2015. The Myriad lung cancer prognostic test showed high risk prognostic score of 31 and 25% risk of dying from lung cancer in 5 years. .  CURRENT THERAPY: Adjuvant systemic chemotherapy with carboplatin for AUC of 5 and Alimta 500 MG/M2 every 3 weeks. First dose 03/22/2015.  INTERVAL HISTORY: Donald Delacruz 74 y.o. male returns to the clinic today for follow-up visit accompanied by his wife. The patient tolerated the first cycle of his adjuvant systemic chemotherapy was carboplatin and Alimta fairly well with no significant adverse effects. He denied having any significant chest pain, shortness of breath, cough or hemoptysis. He denied having any significant weight loss or night sweats. He has no nausea or vomiting. He is here today to start cycle #2.Donald Delacruz  MEDICAL HISTORY: Past Medical History  Diagnosis Date  . Essential hypertension   . ED (erectile dysfunction)   . Insomnia   . Lipoma   . Osteoarthritis   . Emphysema lung ([facility])   . Restless legs syndrome   . Coronary artery calcification seen on computed tomography June 2016    CT scan of chest: Coronary artery calcifications are noted  . Thoracic aortic atherosclerosis [facility]) June 2016    CT scan chest: Atherosclerosis of thoracic aorta is noted without aneurysm or dissection. Visualized portion of upper abdomen demonstrates stable calcified splenic artery and right renal  . 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  MEDICATIONS:  Current Outpatient Prescriptions  Medication Sig Dispense Refill  . acetaminophen (TYLENOL) 500 MG tablet Take 1,000 mg by mouth every 4 (four) hours as needed for moderate pain or headache.     Donald Delacruz aspirin EC 81 MG tablet Take 81 mg by mouth every morning.     . Calcium Carbonate-Vitamin D (CALCIUM 600+D) 600-400 MG-UNIT per tablet Take 1 tablet by mouth every morning.     . clobetasol ointment (TEMOVATE) 0.05 % Clobetasol Propionate 0.05 % External Ointment Apply to rash, and rub in well, twice a day as needed.  Quantity: 1;  Refills: 2   Wilson M.D., Jama Flavors ;  Start 24-Apr-2010 Active 30 GM Tube    . dexamethasone (DECADRON) 4 MG tablet 4 mg by mouth twice a day the day before, day of and day after the chemotherapy every 3 weeks. 40 tablet 0  . folic acid (FOLVITE) 1 MG tablet Take 1 tablet (1 mg total) by mouth daily. 30 tablet 2  . gabapentin (NEURONTIN) 300 MG capsule Take 300-600 mg by mouth at bedtime.     Donald Delacruz lisinopril (PRINIVIL,ZESTRIL) 20 MG tablet Take 20 mg by mouth every morning.     . meloxicam (MOBIC) 15 MG tablet Take 15 mg by mouth daily.    . metoprolol succinate (TOPROL-XL) 100 MG 24 hr tablet Take 100 mg by mouth every morning.     . Multiple Vitamin (MULTIVITAMIN) capsule Take 1 capsule by mouth every morning.     . Omega-3 Fatty Acids (FISH OIL) 1000  MG CAPS Take 1,000 mg by mouth daily.    . prochlorperazine (COMPAZINE) 10 MG tablet Take 1 tablet (10 mg total) by mouth every 6 (six) hours as needed for nausea or vomiting. 60 tablet 0  . sildenafil (VIAGRA) 100 MG tablet Take 100 mg by mouth daily as needed for erectile dysfunction.     . Turmeric 450 MG CAPS Take 500 mg by mouth every morning.     . vardenafil (LEVITRA) 20 MG tablet Take 20 mg by mouth daily as needed for erectile dysfunction.     Donald Delacruz diltiazem (CARDIZEM CD) 180 MG 24 hr capsule Take 180 mg by mouth every morning.      No current facility-administered  medications for this visit.    SURGICAL HISTORY:  Past Surgical History  Procedure Laterality Date  . Cataract extraction    . Pilonidal cyst excision    . Video bronchoscopy N/A 01/30/2015    Procedure: VIDEO BRONCHOSCOPY;  Surgeon: [name], MD;  Location: [facility] OR;  Service: Thoracic;  Laterality: N/A;  . Video assisted thoracoscopy (vats)/wedge resection Right 01/30/2015    Procedure: Right VIDEO ASSISTED THORACOSCOPY, Right upper Lobectomy with lymph node dissection and ONQ insertion;  Surgeon: [name], MD;  Location: [facility];  Service: Thoracic;  Laterality: Right;    REVIEW OF SYSTEMS:  A comprehensive review of systems was negative.   PHYSICAL EXAMINATION: General appearance: alert, cooperative and no distress Head: Normocephalic, without obvious abnormality, atraumatic Neck: no adenopathy, no JVD, supple, symmetrical, trachea midline and thyroid not enlarged, symmetric, no tenderness/mass/nodules Lymph nodes: Cervical, supraclavicular, and axillary nodes normal. Resp: clear to auscultation bilaterally Back: symmetric, no curvature. ROM normal. No CVA tenderness. Cardio: regular rate and rhythm, S1, S2 normal, no murmur, click, rub or gallop GI: soft, non-tender; bowel sounds normal; no masses,  no organomegaly Extremities: extremities normal, atraumatic, no cyanosis or edema Neurologic: Alert and oriented X 3, normal strength and tone. Normal symmetric reflexes. Normal coordination and gait  ECOG PERFORMANCE STATUS: 0 - Asymptomatic  Blood pressure 153/76, pulse 72, resp. rate 18, height 5' 10"  (1.778 m), weight 168 lb 11.2 oz (76.522 kg), SpO2 98 %.  LABORATORY DATA: Lab Results  Component Value Date   WBC 7.3 04/12/2015   HGB 12.1* 04/12/2015   HCT 36.3* 04/12/2015   MCV 95.7 04/12/2015   PLT 394 04/12/2015      Chemistry      Component Value Date/Time   NA 140 04/12/2015 1200   NA 137 02/03/2015 0236   K 4.9 04/12/2015 1200   K 4.4 02/03/2015 0236     CL 103 02/03/2015 0236   CO2 20* 04/12/2015 1200   CO2 26 02/03/2015 0236   BUN 24.0 04/12/2015 1200   BUN 21* 02/03/2015 0236   CREATININE 1.0 04/12/2015 1200   CREATININE 1.25* 02/03/2015 0236      Component Value Date/Time   CALCIUM 9.7 04/12/2015 1200   CALCIUM 8.6* 02/03/2015 0236   ALKPHOS 54 04/12/2015 1200   ALKPHOS 33* 02/01/2015 0415   AST 19 04/12/2015 1200   AST 25 02/01/2015 0415   ALT 18 04/12/2015 1200   ALT 16* 02/01/2015 0415   BILITOT 0.33 04/12/2015 1200   BILITOT 0.4 02/01/2015 0415       RADIOGRAPHIC STUDIES: Dg Chest 2 View  03/29/2015  CLINICAL DATA:  74 year old male with history of right upper lobectomy on 01/30/2015 presenting with shortness of breath, primarily during exertion. EXAM: CHEST  2 VIEW COMPARISON:  Chest x-ray 02/22/2015. FINDINGS: Postoperative changes of right upper lobectomy are noted. Compensatory hyperexpansion of the right middle and lower lobes. No suspicious appearing pulmonary nodules or masses. No acute consolidative airspace disease. No pneumothorax. No pleural effusions. No evidence of pulmonary edema. Heart size and mediastinal contours are within normal limits. Atherosclerosis in the thoracic aorta. Prominent calcifications/ossification along the inferior aspect of the right first rib, chronic and similar to numerous prior examinations. IMPRESSION: 1. Status post right upper lobectomy. No acute findings and no findings to suggest recurrent disease on today's plain film examination. 2. Atherosclerosis. Electronically Signed   By: Vinnie Langton M.D.   On: 03/29/2015 12:20   Ct Head W Wo Contrast  03/15/2015  CLINICAL DATA:  Lung cancer.  Staging. EXAM: CT HEAD WITHOUT AND WITH CONTRAST TECHNIQUE: Contiguous axial images were obtained from the base of the skull through the vertex without and with intravenous contrast CONTRAST:  19m OMNIPAQUE IOHEXOL 300 MG/ML  SOLN COMPARISON:  None. FINDINGS: No evidence for acute infarction,  hemorrhage, mass lesion, hydrocephalus, or extra-axial fluid. Mild cerebral and cerebellar atrophy. Post infusion, no abnormal enhancement of the brain or meninges. No osseous sclerotic or lytic lesions. No visible orbital or sinus pathology. Negative mastoids. IMPRESSION: Chronic changes as described. No demonstrable intracranial metastatic disease. Electronically Signed   By: JStaci RighterM.D.   On: 03/15/2015 15:10    ASSESSMENT AND PLAN: This is a very pleasant 74years old white male recently diagnosed with a stage IB non-small cell lung cancer, poorly differentiated adenocarcinoma diagnosed in August 2016, status post right upper lobectomy with lymph node dissection and myriad diagnostic tests showed high risk score was 25% risk of dying of lung cancer and 5 years. He is currently undergoing adjuvant systemic chemotherapy with carboplatin and Alimta is status post 1 cycle. He tolerated the first cycle of his treatment fairly well with no significant adverse effects. I recommended for the patient to proceed with cycle #2 today as a scheduled. The patient would come back for follow-up visit in 3 weeks for reevaluation and management of any adverse effect of his treatment. He was advised to call immediately if he has any concerning symptoms in the interval. The patient voices understanding of current disease status and treatment options and is in agreement with the current care plan.  All questions were answered. The patient knows to call the clinic with any problems, questions or concerns. We can certainly see the patient much sooner if necessary.  Disclaimer: This note was dictated with voice recognition software. Similar sounding words can inadvertently be transcribed and may not be corrected upon review.

## 2015-04-19 ENCOUNTER — Other Ambulatory Visit (HOSPITAL_BASED_OUTPATIENT_CLINIC_OR_DEPARTMENT_OTHER): Payer: Medicare Other

## 2015-04-19 DIAGNOSIS — C3411 Malignant neoplasm of upper lobe, right bronchus or lung: Secondary | ICD-10-CM

## 2015-04-19 LAB — CBC WITH DIFFERENTIAL/PLATELET
BASO%: 1.6 % (ref 0.0–2.0)
Basophils Absolute: 0 10*3/uL (ref 0.0–0.1)
EOS%: 0.8 % (ref 0.0–7.0)
Eosinophils Absolute: 0 10*3/uL (ref 0.0–0.5)
HCT: 33.4 % — ABNORMAL LOW (ref 38.4–49.9)
HGB: 11.3 g/dL — ABNORMAL LOW (ref 13.0–17.1)
LYMPH#: 0.7 10*3/uL — AB (ref 0.9–3.3)
LYMPH%: 52.7 % — AB (ref 14.0–49.0)
MCH: 32.1 pg (ref 27.2–33.4)
MCHC: 33.8 g/dL (ref 32.0–36.0)
MCV: 95 fL (ref 79.3–98.0)
MONO#: 0.1 10*3/uL (ref 0.1–0.9)
MONO%: 4.2 % (ref 0.0–14.0)
NEUT%: 40.7 % (ref 39.0–75.0)
NEUTROS ABS: 0.5 10*3/uL — AB (ref 1.5–6.5)
PLATELETS: 184 10*3/uL (ref 140–400)
RBC: 3.51 10*6/uL — AB (ref 4.20–5.82)
RDW: 14.9 % — AB (ref 11.0–14.6)
WBC: 1.3 10*3/uL — AB (ref 4.0–10.3)

## 2015-04-19 LAB — COMPREHENSIVE METABOLIC PANEL (CC13)
ALT: 16 U/L (ref 0–55)
ANION GAP: 5 meq/L (ref 3–11)
AST: 21 U/L (ref 5–34)
Albumin: 3.5 g/dL (ref 3.5–5.0)
Alkaline Phosphatase: 46 U/L (ref 40–150)
BILIRUBIN TOTAL: 0.49 mg/dL (ref 0.20–1.20)
BUN: 24.7 mg/dL (ref 7.0–26.0)
CHLORIDE: 109 meq/L (ref 98–109)
CO2: 24 meq/L (ref 22–29)
CREATININE: 1.1 mg/dL (ref 0.7–1.3)
Calcium: 9.3 mg/dL (ref 8.4–10.4)
EGFR: 67 mL/min/{1.73_m2} — ABNORMAL LOW (ref >90)
GLUCOSE: 109 mg/dL (ref 70–140)
Potassium: 5.1 mEq/L (ref 3.5–5.1)
SODIUM: 139 meq/L (ref 136–145)
TOTAL PROTEIN: 6.2 g/dL — AB (ref 6.4–8.3)

## 2015-04-26 ENCOUNTER — Other Ambulatory Visit: Payer: Self-pay | Admitting: *Deleted

## 2015-04-26 ENCOUNTER — Telehealth: Payer: Self-pay | Admitting: Internal Medicine

## 2015-04-26 ENCOUNTER — Other Ambulatory Visit (HOSPITAL_BASED_OUTPATIENT_CLINIC_OR_DEPARTMENT_OTHER): Payer: Medicare Other

## 2015-04-26 ENCOUNTER — Ambulatory Visit (HOSPITAL_BASED_OUTPATIENT_CLINIC_OR_DEPARTMENT_OTHER): Payer: Medicare Other

## 2015-04-26 VITALS — BP 175/71 | HR 73 | Temp 97.8°F

## 2015-04-26 DIAGNOSIS — C3411 Malignant neoplasm of upper lobe, right bronchus or lung: Secondary | ICD-10-CM

## 2015-04-26 DIAGNOSIS — C3410 Malignant neoplasm of upper lobe, unspecified bronchus or lung: Secondary | ICD-10-CM

## 2015-04-26 LAB — CBC WITH DIFFERENTIAL/PLATELET
BASO%: 0.8 % (ref 0.0–2.0)
Basophils Absolute: 0 10*3/uL (ref 0.0–0.1)
EOS%: 0.8 % (ref 0.0–7.0)
Eosinophils Absolute: 0 10*3/uL (ref 0.0–0.5)
HCT: 30.8 % — ABNORMAL LOW (ref 38.4–49.9)
HGB: 10.5 g/dL — ABNORMAL LOW (ref 13.0–17.1)
LYMPH%: 51.2 % — AB (ref 14.0–49.0)
MCH: 31.6 pg (ref 27.2–33.4)
MCHC: 34.1 g/dL (ref 32.0–36.0)
MCV: 92.8 fL (ref 79.3–98.0)
MONO#: 0.5 10*3/uL (ref 0.1–0.9)
MONO%: 37.2 % — AB (ref 0.0–14.0)
NEUT%: 10 % — ABNORMAL LOW (ref 39.0–75.0)
NEUTROS ABS: 0.1 10*3/uL — AB (ref 1.5–6.5)
Platelets: 65 10*3/uL — ABNORMAL LOW (ref 140–400)
RBC: 3.32 10*6/uL — AB (ref 4.20–5.82)
RDW: 14.7 % — ABNORMAL HIGH (ref 11.0–14.6)
WBC: 1.3 10*3/uL — AB (ref 4.0–10.3)
lymph#: 0.7 10*3/uL — ABNORMAL LOW (ref 0.9–3.3)
nRBC: 0 % (ref 0–0)

## 2015-04-26 LAB — COMPREHENSIVE METABOLIC PANEL (CC13)
ALT: 18 U/L (ref 0–55)
ANION GAP: 7 meq/L (ref 3–11)
AST: 22 U/L (ref 5–34)
Albumin: 3.7 g/dL (ref 3.5–5.0)
Alkaline Phosphatase: 50 U/L (ref 40–150)
BUN: 16.9 mg/dL (ref 7.0–26.0)
CHLORIDE: 109 meq/L (ref 98–109)
CO2: 23 meq/L (ref 22–29)
CREATININE: 0.9 mg/dL (ref 0.7–1.3)
Calcium: 9.4 mg/dL (ref 8.4–10.4)
EGFR: 85 mL/min/{1.73_m2} — ABNORMAL LOW (ref >90)
Glucose: 114 mg/dl (ref 70–140)
Potassium: 5.3 mEq/L — ABNORMAL HIGH (ref 3.5–5.1)
Sodium: 140 mEq/L (ref 136–145)
Total Bilirubin: <0.3 mg/dL (ref 0.20–1.20)
Total Protein: 6.5 g/dL (ref 6.4–8.3)

## 2015-04-26 MED ORDER — TBO-FILGRASTIM 480 MCG/0.8ML ~~LOC~~ SOSY
300.0000 ug | PREFILLED_SYRINGE | Freq: Once | SUBCUTANEOUS | Status: AC
  Administered 2015-04-26: 300 ug via SUBCUTANEOUS
  Filled 2015-04-26: qty 0.5

## 2015-04-26 NOTE — Telephone Encounter (Signed)
lvm fo rpt regarding to inj. today

## 2015-04-26 NOTE — Patient Instructions (Signed)

## 2015-04-26 NOTE — Progress Notes (Unsigned)
Labs reviewed by MD on call and pharmacy- VO for Granix 300 x1 today. Recommendation from [facility] in Pharmacy for pt to have on-pro with treatment. POF to scheduling for injection Walked out to lobby to discuss with pt.

## 2015-04-29 NOTE — Progress Notes (Signed)
Quick Note:  Call patient with the result and advise Neutropenic precautions. ______

## 2015-04-30 ENCOUNTER — Telehealth: Payer: Self-pay | Admitting: Medical Oncology

## 2015-04-30 NOTE — Telephone Encounter (Signed)
-----   Message from Curt Bears, MD sent at 04/29/2015  2:43 PM EDT ----- Call patient with the result and advise Neutropenic precautions.

## 2015-04-30 NOTE — Telephone Encounter (Signed)
I left message with appropriate precautions and  instructions.

## 2015-05-03 ENCOUNTER — Ambulatory Visit (HOSPITAL_BASED_OUTPATIENT_CLINIC_OR_DEPARTMENT_OTHER): Payer: Medicare Other | Admitting: Physician Assistant

## 2015-05-03 ENCOUNTER — Telehealth: Payer: Self-pay | Admitting: Internal Medicine

## 2015-05-03 ENCOUNTER — Ambulatory Visit (HOSPITAL_BASED_OUTPATIENT_CLINIC_OR_DEPARTMENT_OTHER): Payer: Medicare Other

## 2015-05-03 ENCOUNTER — Other Ambulatory Visit (HOSPITAL_BASED_OUTPATIENT_CLINIC_OR_DEPARTMENT_OTHER): Payer: Medicare Other

## 2015-05-03 VITALS — BP 139/64 | HR 75 | Temp 97.7°F | Resp 17 | Ht 70.0 in | Wt 173.4 lb

## 2015-05-03 DIAGNOSIS — Z5111 Encounter for antineoplastic chemotherapy: Secondary | ICD-10-CM

## 2015-05-03 DIAGNOSIS — C3411 Malignant neoplasm of upper lobe, right bronchus or lung: Secondary | ICD-10-CM | POA: Diagnosis not present

## 2015-05-03 LAB — CBC WITH DIFFERENTIAL/PLATELET
BASO%: 0.2 % (ref 0.0–2.0)
Basophils Absolute: 0 10*3/uL (ref 0.0–0.1)
EOS%: 0 % (ref 0.0–7.0)
Eosinophils Absolute: 0 10*3/uL (ref 0.0–0.5)
HCT: 31.9 % — ABNORMAL LOW (ref 38.4–49.9)
HGB: 10.7 g/dL — ABNORMAL LOW (ref 13.0–17.1)
LYMPH#: 0.6 10*3/uL — AB (ref 0.9–3.3)
LYMPH%: 8.2 % — AB (ref 14.0–49.0)
MCH: 32.3 pg (ref 27.2–33.4)
MCHC: 33.7 g/dL (ref 32.0–36.0)
MCV: 95.9 fL (ref 79.3–98.0)
MONO#: 1.2 10*3/uL — ABNORMAL HIGH (ref 0.1–0.9)
MONO%: 14.9 % — ABNORMAL HIGH (ref 0.0–14.0)
NEUT%: 76.7 % — AB (ref 39.0–75.0)
NEUTROS ABS: 6 10*3/uL (ref 1.5–6.5)
PLATELETS: 262 10*3/uL (ref 140–400)
RBC: 3.32 10*6/uL — AB (ref 4.20–5.82)
RDW: 16.5 % — ABNORMAL HIGH (ref 11.0–14.6)
WBC: 7.9 10*3/uL (ref 4.0–10.3)

## 2015-05-03 LAB — COMPREHENSIVE METABOLIC PANEL (CC13)
ALT: 16 U/L (ref 0–55)
ANION GAP: 10 meq/L (ref 3–11)
AST: 20 U/L (ref 5–34)
Albumin: 3.8 g/dL (ref 3.5–5.0)
Alkaline Phosphatase: 58 U/L (ref 40–150)
BILIRUBIN TOTAL: 0.3 mg/dL (ref 0.20–1.20)
BUN: 17.3 mg/dL (ref 7.0–26.0)
CHLORIDE: 110 meq/L — AB (ref 98–109)
CO2: 20 meq/L — AB (ref 22–29)
CREATININE: 1 mg/dL (ref 0.7–1.3)
Calcium: 9.7 mg/dL (ref 8.4–10.4)
EGFR: 76 mL/min/{1.73_m2} — ABNORMAL LOW (ref >90)
GLUCOSE: 148 mg/dL — AB (ref 70–140)
Potassium: 4.8 mEq/L (ref 3.5–5.1)
Sodium: 140 mEq/L (ref 136–145)
TOTAL PROTEIN: 6.9 g/dL (ref 6.4–8.3)

## 2015-05-03 MED ORDER — SODIUM CHLORIDE 0.9 % IV SOLN
471.5000 mg | Freq: Once | INTRAVENOUS | Status: AC
  Administered 2015-05-03: 470 mg via INTRAVENOUS
  Filled 2015-05-03: qty 47

## 2015-05-03 MED ORDER — SODIUM CHLORIDE 0.9 % IV SOLN
Freq: Once | INTRAVENOUS | Status: AC
  Administered 2015-05-03: 11:00:00 via INTRAVENOUS

## 2015-05-03 MED ORDER — SODIUM CHLORIDE 0.9 % IV SOLN
Freq: Once | INTRAVENOUS | Status: AC
  Administered 2015-05-03: 11:00:00 via INTRAVENOUS
  Filled 2015-05-03: qty 8

## 2015-05-03 MED ORDER — SODIUM CHLORIDE 0.9 % IV SOLN
518.0000 mg/m2 | Freq: Once | INTRAVENOUS | Status: AC
  Administered 2015-05-03: 1000 mg via INTRAVENOUS
  Filled 2015-05-03: qty 40

## 2015-05-03 MED ORDER — CYANOCOBALAMIN 1000 MCG/ML IJ SOLN
INTRAMUSCULAR | Status: AC
  Filled 2015-05-03: qty 1

## 2015-05-03 MED ORDER — CYANOCOBALAMIN 1000 MCG/ML IJ SOLN
1000.0000 ug | Freq: Once | INTRAMUSCULAR | Status: AC
  Administered 2015-05-03: 1000 ug via INTRAMUSCULAR

## 2015-05-03 NOTE — Telephone Encounter (Signed)
Pt didi not need new sched....the patient did not need new sched

## 2015-05-03 NOTE — Patient Instructions (Signed)
[facility] Discharge Instructions for Patients Receiving Chemotherapy  Today you received the following chemotherapy agents: Alimta/Carboplatin  To help prevent nausea and vomiting after your treatment, we encourage you to take your nausea medication as directed.   If you develop nausea and vomiting that is not controlled by your nausea medication, call the clinic.   BELOW ARE SYMPTOMS THAT SHOULD BE REPORTED IMMEDIATELY:  *FEVER GREATER THAN 100.5 F  *CHILLS WITH OR WITHOUT FEVER  NAUSEA AND VOMITING THAT IS NOT CONTROLLED WITH YOUR NAUSEA MEDICATION  *UNUSUAL SHORTNESS OF BREATH  *UNUSUAL BRUISING OR BLEEDING  TENDERNESS IN MOUTH AND THROAT WITH OR WITHOUT PRESENCE OF ULCERS  *URINARY PROBLEMS  *BOWEL PROBLEMS  UNUSUAL RASH Items with * indicate a potential emergency and should be followed up as soon as possible.  Feel free to call the clinic you have any questions or concerns. The clinic phone number is [phone].  Please show the CHEMO ALERT CARD at check-in to the Emergency Department and triage nurse.   

## 2015-05-03 NOTE — Progress Notes (Signed)
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  PRIOR THERAPY:  Status post right upper lobectomy with lymph node dissection on 01/30/2015. The Myriad lung cancer prognostic test showed high risk prognostic score of 31 and 25% risk of dying from lung cancer in 5 years. .  CURRENT THERAPY: Adjuvant systemic chemotherapy with carboplatin for AUC of 5 and Alimta 500 MG/M2 every 3 weeks. First dose 03/22/2015.  INTERVAL HISTORY: Donald Delacruz 74 y.o. male returns to the clinic today for follow-up visit accompanied by his wife. The patient tolerated the second cycle of his adjuvant systemic chemotherapy was carboplatin and Alimta fairly well with no significant adverse effects. He denied having any significant chest pain, shortness of breath, cough or hemoptysis. He denied having any significant weight loss or night sweats. He has no nausea or vomiting. He tolerated his dose of Granix on 10/27 without any side effects.He is here today to start cycle #3.  MEDICAL HISTORY: Past Medical History  Diagnosis Date  . Essential hypertension   . ED (erectile dysfunction)   . Insomnia   . Lipoma   . Osteoarthritis   . Emphysema lung ([place])   . Restless legs syndrome   . Coronary artery calcification seen on computed tomography June 2016    CT scan of chest: Coronary artery calcifications are noted  . Thoracic aortic atherosclerosis [facility]) June 2016    CT scan chest: Atherosclerosis of thoracic aorta is noted without aneurysm or dissection. Visualized portion of upper abdomen demonstrates stable calcified splenic artery and right renal  . 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  MEDICATIONS:  Current Outpatient Prescriptions  Medication Sig Dispense Refill  . acetaminophen (TYLENOL) 500 MG tablet Take 1,000 mg by mouth every 4 (four) hours as needed for moderate pain or headache.     [name] aspirin EC 81 MG tablet Take 81 mg by mouth every morning.     . Calcium Carbonate-Vitamin D (CALCIUM 600+D) 600-400 MG-UNIT per tablet Take 1 tablet by mouth every morning.     . clobetasol ointment (TEMOVATE) 0.05 % Clobetasol Propionate 0.05 % External Ointment Apply to rash, and rub in well, twice a day as needed.  Quantity: 1;  Refills: 2   Wilson M.D., Jama Flavors ;  Start 24-Apr-2010 Active 30 GM Tube    . dexamethasone (DECADRON) 4 MG tablet 4 mg by mouth twice a day the day before, day of and day after the chemotherapy every 3 weeks. 40 tablet 0  . diltiazem (CARDIZEM CD) 180 MG 24 hr capsule Take 180 mg by mouth every morning.     . folic acid (FOLVITE) 1 MG tablet Take 1 tablet (1 mg total) by mouth daily. 30 tablet 2  . gabapentin (NEURONTIN) 300 MG capsule Take 300-600 mg by mouth at bedtime.     [name] lisinopril (PRINIVIL,ZESTRIL) 20 MG tablet Take 20 mg by mouth every morning.     . meloxicam (MOBIC) 15 MG tablet Take 15 mg by mouth daily.    . metoprolol succinate (TOPROL-XL) 100 MG 24 hr tablet Take 100 mg  by mouth every morning.     . Multiple Vitamin (MULTIVITAMIN) capsule Take 1 capsule by mouth every morning.     . Omega-3 Fatty Acids (FISH OIL) 1000 MG CAPS Take 1,000 mg by mouth daily.    . prochlorperazine (COMPAZINE) 10 MG tablet Take 1 tablet (10 mg total) by mouth every 6 (six) hours as needed for nausea or vomiting. 60 tablet 0  . sildenafil (VIAGRA) 100 MG tablet Take 100 mg by mouth daily as needed for erectile dysfunction.     . Turmeric 450 MG CAPS Take 500 mg by mouth every morning.     . vardenafil (LEVITRA) 20 MG tablet Take 20 mg by mouth daily as needed for erectile  dysfunction.      No current facility-administered medications for this visit.    SURGICAL HISTORY:  Past Surgical History  Procedure Laterality Date  . Cataract extraction    . Pilonidal cyst excision    . Video bronchoscopy N/A 01/30/2015    Procedure: VIDEO BRONCHOSCOPY;  Surgeon: [name], MD;  Location: [facility] OR;  Service: Thoracic;  Laterality: N/A;  . Video assisted thoracoscopy (vats)/wedge resection Right 01/30/2015    Procedure: Right VIDEO ASSISTED THORACOSCOPY, Right upper Lobectomy with lymph node dissection and ONQ insertion;  Surgeon: [name], MD;  Location: [place];  Service: Thoracic;  Laterality: Right;    REVIEW OF SYSTEMS:  A comprehensive review of systems was negative.   PHYSICAL EXAMINATION: General appearance: alert, cooperative and no distress Head: Normocephalic, without obvious abnormality, atraumatic Neck: no adenopathy, no JVD, supple, symmetrical, trachea midline and thyroid not enlarged, symmetric, no tenderness/mass/nodules Lymph nodes: Cervical, supraclavicular, and axillary nodes normal. Resp: clear to auscultation bilaterally Back: symmetric, no curvature. ROM normal. No CVA tenderness. Cardio: regular rate and rhythm, S1, S2 normal, no murmur, click, rub or gallop GI: soft, non-tender; bowel sounds normal; no masses,  no organomegaly Extremities: extremities normal, atraumatic, no cyanosis or edema Neurologic: Alert and oriented X 3, normal strength and tone. Normal symmetric reflexes. Normal coordination and gait  ECOG PERFORMANCE STATUS: 0 - Asymptomatic  There were no vitals taken for this visit.  LABORATORY DATA: CBC Latest Ref Rng 04/26/2015 04/19/2015 04/12/2015  WBC 4.0 - 10.3 10e3/uL 1.3(L) 1.3(L) 7.3  Hemoglobin 13.0 - 17.1 g/dL 10.5(L) 11.3(L) 12.1(L)  Hematocrit 38.4 - 49.9 % 30.8(L) 33.4(L) 36.3(L)  Platelets 140 - 400 10e3/uL 65(L) 184 394   CMP Latest Ref Rng 04/26/2015 04/19/2015 04/12/2015  Glucose 70 - 140 mg/dl  114 109 121  BUN 7.0 - 26.0 mg/dL 16.9 24.7 24.0  Creatinine 0.7 - 1.3 mg/dL 0.9 1.1 1.0  Sodium 136 - 145 mEq/L 140 139 140  Potassium 3.5 - 5.1 mEq/L 5.3(H) 5.1 4.9  Chloride 101 - 111 mmol/L - - -  CO2 22 - 29 mEq/L 23 24 20(L)  Calcium 8.4 - 10.4 mg/dL 9.4 9.3 9.7  Total Protein 6.4 - 8.3 g/dL 6.5 6.2(L) 6.9  Total Bilirubin 0.20 - 1.20 mg/dL <0.30 Repeated and Verified 0.49 0.33  Alkaline Phos 40 - 150 U/L 50 46 54  AST 5 - 34 U/L 22 21 19   ALT 0 - 55 U/L 18 16 18      RADIOGRAPHIC STUDIES: No results found.  ASSESSMENT AND PLAN: This is a very pleasant 74 years old white male recently diagnosed with a stage IB non-small cell lung cancer, poorly differentiated adenocarcinoma diagnosed in August 2016, status post right upper lobectomy with lymph node dissection and myriad diagnostic  tests showed high risk score was 25% risk of dying of lung cancer and 5 years. He is currently undergoing adjuvant systemic chemotherapy with carboplatin and Alimta is status post 2 cycles. He tolerated the second cycle of his treatment fairly well with no significant adverse effects. Will proceed with cycle #3 today as a scheduled. The patient would come back for follow-up visit in 3 weeks for reevaluation and management of any adverse effect of his treatment. He was advised to call immediately if he has any concerning symptoms in the interval. The patient voices understanding of current disease status and treatment options and is in agreement with the current care plan.  All questions were answered. The patient knows to call the clinic with any problems, questions or concerns. We can certainly see the patient much sooner if necessary.  Disclaimer: This note was dictated with voice recognition software. Similar sounding words can inadvertently be transcribed and may not be corrected upon review.   ADDENDUM: Hematology/Oncology Attending: I had a face to face encounter with the patient. I recommended  his care plan. This is a very pleasant 74 years old white male with stage IB non-small cell lung cancer status post left upper lobectomy with lymph node dissection and he is currently undergoing adjuvant systemic chemotherapy with carboplatin and Alimta status post 2 cycles. He is tolerating his treatment fairly well except for chemotherapy-induced neutropenia and he required treatment with Granix for a few days after each cycle. I gave the patient the option of treatment with Neulasta after his chemotherapy versus close monitoring of his blood work and treatment with Granix if needed. He preferred to continue with the current treatment plan for now and was not interested in receiving Neulasta after his chemotherapy. We will proceed with cycle #3 today as scheduled. The patient would come back for follow-up visit in 3 weeks for reevaluation before starting cycle #4. He was advised to call immediately if he has any concerning symptoms in the interval.  Disclaimer: This note was dictated with voice recognition software. Similar sounding words can inadvertently be transcribed and may be missed upon review. Eilleen Kempf., MD 05/04/2015

## 2015-05-04 ENCOUNTER — Encounter: Payer: Self-pay | Admitting: Physician Assistant

## 2015-05-10 ENCOUNTER — Ambulatory Visit (HOSPITAL_BASED_OUTPATIENT_CLINIC_OR_DEPARTMENT_OTHER): Payer: Medicare Other

## 2015-05-10 ENCOUNTER — Telehealth: Payer: Self-pay | Admitting: Internal Medicine

## 2015-05-10 ENCOUNTER — Other Ambulatory Visit (HOSPITAL_BASED_OUTPATIENT_CLINIC_OR_DEPARTMENT_OTHER): Payer: Medicare Other

## 2015-05-10 ENCOUNTER — Other Ambulatory Visit: Payer: Self-pay | Admitting: *Deleted

## 2015-05-10 DIAGNOSIS — D701 Agranulocytosis secondary to cancer chemotherapy: Secondary | ICD-10-CM

## 2015-05-10 DIAGNOSIS — C3411 Malignant neoplasm of upper lobe, right bronchus or lung: Secondary | ICD-10-CM

## 2015-05-10 DIAGNOSIS — C3410 Malignant neoplasm of upper lobe, unspecified bronchus or lung: Secondary | ICD-10-CM

## 2015-05-10 LAB — CBC WITH DIFFERENTIAL/PLATELET
BASO%: 0.7 % (ref 0.0–2.0)
BASOS ABS: 0 10*3/uL (ref 0.0–0.1)
EOS%: 2 % (ref 0.0–7.0)
Eosinophils Absolute: 0 10*3/uL (ref 0.0–0.5)
HCT: 31.9 % — ABNORMAL LOW (ref 38.4–49.9)
HGB: 10.9 g/dL — ABNORMAL LOW (ref 13.0–17.1)
LYMPH%: 54.4 % — AB (ref 14.0–49.0)
MCH: 32.1 pg (ref 27.2–33.4)
MCHC: 34.2 g/dL (ref 32.0–36.0)
MCV: 93.8 fL (ref 79.3–98.0)
MONO#: 0.1 10*3/uL (ref 0.1–0.9)
MONO%: 6.7 % (ref 0.0–14.0)
NEUT%: 36.2 % — AB (ref 39.0–75.0)
NEUTROS ABS: 0.5 10*3/uL — AB (ref 1.5–6.5)
PLATELETS: 145 10*3/uL (ref 140–400)
RBC: 3.4 10*6/uL — AB (ref 4.20–5.82)
RDW: 16.5 % — ABNORMAL HIGH (ref 11.0–14.6)
WBC: 1.5 10*3/uL — ABNORMAL LOW (ref 4.0–10.3)
lymph#: 0.8 10*3/uL — ABNORMAL LOW (ref 0.9–3.3)

## 2015-05-10 LAB — COMPREHENSIVE METABOLIC PANEL (CC13)
ALT: 16 U/L (ref 0–55)
ANION GAP: 9 meq/L (ref 3–11)
AST: 21 U/L (ref 5–34)
Albumin: 3.4 g/dL — ABNORMAL LOW (ref 3.5–5.0)
Alkaline Phosphatase: 47 U/L (ref 40–150)
BUN: 24.1 mg/dL (ref 7.0–26.0)
CHLORIDE: 106 meq/L (ref 98–109)
CO2: 22 meq/L (ref 22–29)
Calcium: 8.9 mg/dL (ref 8.4–10.4)
Creatinine: 0.9 mg/dL (ref 0.7–1.3)
EGFR: 84 mL/min/{1.73_m2} — AB (ref >90)
GLUCOSE: 104 mg/dL (ref 70–140)
POTASSIUM: 5.2 meq/L — AB (ref 3.5–5.1)
SODIUM: 137 meq/L (ref 136–145)
Total Bilirubin: 0.51 mg/dL (ref 0.20–1.20)
Total Protein: 6.1 g/dL — ABNORMAL LOW (ref 6.4–8.3)

## 2015-05-10 MED ORDER — TBO-FILGRASTIM 300 MCG/0.5ML ~~LOC~~ SOSY: 300.0000 ug | PREFILLED_SYRINGE | Freq: Once | SUBCUTANEOUS | Status: DC

## 2015-05-10 MED ORDER — TBO-FILGRASTIM 300 MCG/0.5ML ~~LOC~~ SOSY
300.0000 ug | PREFILLED_SYRINGE | Freq: Once | SUBCUTANEOUS | Status: AC
  Administered 2015-05-10: 300 ug via SUBCUTANEOUS
  Filled 2015-05-10: qty 0.5

## 2015-05-10 NOTE — Telephone Encounter (Signed)
per pof to sch pt appt-gave pt copy of avs °

## 2015-05-11 ENCOUNTER — Ambulatory Visit (HOSPITAL_BASED_OUTPATIENT_CLINIC_OR_DEPARTMENT_OTHER): Payer: Medicare Other

## 2015-05-11 VITALS — BP 146/69 | HR 90 | Temp 98.5°F

## 2015-05-11 DIAGNOSIS — C3411 Malignant neoplasm of upper lobe, right bronchus or lung: Secondary | ICD-10-CM | POA: Diagnosis not present

## 2015-05-11 DIAGNOSIS — C3410 Malignant neoplasm of upper lobe, unspecified bronchus or lung: Secondary | ICD-10-CM

## 2015-05-11 DIAGNOSIS — D701 Agranulocytosis secondary to cancer chemotherapy: Secondary | ICD-10-CM

## 2015-05-11 MED ORDER — TBO-FILGRASTIM 300 MCG/0.5ML ~~LOC~~ SOSY
300.0000 ug | PREFILLED_SYRINGE | Freq: Once | SUBCUTANEOUS | Status: AC
  Administered 2015-05-11: 300 ug via SUBCUTANEOUS
  Filled 2015-05-11: qty 0.5

## 2015-05-16 ENCOUNTER — Telehealth: Payer: Self-pay | Admitting: Internal Medicine

## 2015-05-16 NOTE — Telephone Encounter (Signed)
Due to SW out 11/25 f/u moved to HB. Left message for patient re new time for 11/25 and mailed schedule.

## 2015-05-16 NOTE — Telephone Encounter (Signed)
Patient called due to message re 11/25 appointments being adjusted to PM due to SW PAL and f/u moved to HB. Per patient that is not good for him - he cannot come in at that time. Patient requested that appointments be moved to11/23 as this was an option when he was given 11/25 due to the holiday. Appointments moved to 11/23. Patient has new date/time.

## 2015-05-17 ENCOUNTER — Other Ambulatory Visit (HOSPITAL_BASED_OUTPATIENT_CLINIC_OR_DEPARTMENT_OTHER): Payer: Medicare Other

## 2015-05-17 DIAGNOSIS — C3411 Malignant neoplasm of upper lobe, right bronchus or lung: Secondary | ICD-10-CM

## 2015-05-17 LAB — COMPREHENSIVE METABOLIC PANEL (CC13)
ALBUMIN: 3.7 g/dL (ref 3.5–5.0)
ALK PHOS: 55 U/L (ref 40–150)
ALT: 15 U/L (ref 0–55)
ANION GAP: 10 meq/L (ref 3–11)
AST: 23 U/L (ref 5–34)
BUN: 18.1 mg/dL (ref 7.0–26.0)
CO2: 23 mEq/L (ref 22–29)
CREATININE: 1 mg/dL (ref 0.7–1.3)
Calcium: 9.6 mg/dL (ref 8.4–10.4)
Chloride: 108 mEq/L (ref 98–109)
EGFR: 72 mL/min/{1.73_m2} — AB (ref >90)
GLUCOSE: 108 mg/dL (ref 70–140)
Potassium: 5.2 mEq/L — ABNORMAL HIGH (ref 3.5–5.1)
Sodium: 140 mEq/L (ref 136–145)
TOTAL PROTEIN: 6.6 g/dL (ref 6.4–8.3)

## 2015-05-17 LAB — CBC WITH DIFFERENTIAL/PLATELET
BASO%: 0 % (ref 0.0–2.0)
Basophils Absolute: 0 10*3/uL (ref 0.0–0.1)
EOS%: 0.3 % (ref 0.0–7.0)
Eosinophils Absolute: 0 10*3/uL (ref 0.0–0.5)
HCT: 30.4 % — ABNORMAL LOW (ref 38.4–49.9)
HEMOGLOBIN: 10.5 g/dL — AB (ref 13.0–17.1)
LYMPH%: 34.4 % (ref 14.0–49.0)
MCH: 32.5 pg (ref 27.2–33.4)
MCHC: 34.5 g/dL (ref 32.0–36.0)
MCV: 94.1 fL (ref 79.3–98.0)
MONO#: 0.7 10*3/uL (ref 0.1–0.9)
MONO%: 25 % — ABNORMAL HIGH (ref 0.0–14.0)
NEUT%: 40.3 % (ref 39.0–75.0)
NEUTROS ABS: 1.2 10*3/uL — AB (ref 1.5–6.5)
PLATELETS: 43 10*3/uL — AB (ref 140–400)
RBC: 3.23 10*6/uL — AB (ref 4.20–5.82)
RDW: 16.8 % — AB (ref 11.0–14.6)
WBC: 2.9 10*3/uL — AB (ref 4.0–10.3)
lymph#: 1 10*3/uL (ref 0.9–3.3)

## 2015-05-23 ENCOUNTER — Other Ambulatory Visit (HOSPITAL_BASED_OUTPATIENT_CLINIC_OR_DEPARTMENT_OTHER): Payer: Medicare Other

## 2015-05-23 ENCOUNTER — Telehealth: Payer: Self-pay | Admitting: Internal Medicine

## 2015-05-23 ENCOUNTER — Ambulatory Visit (HOSPITAL_BASED_OUTPATIENT_CLINIC_OR_DEPARTMENT_OTHER): Payer: Medicare Other | Admitting: Internal Medicine

## 2015-05-23 ENCOUNTER — Encounter: Payer: Self-pay | Admitting: Internal Medicine

## 2015-05-23 ENCOUNTER — Ambulatory Visit (HOSPITAL_BASED_OUTPATIENT_CLINIC_OR_DEPARTMENT_OTHER): Payer: Medicare Other

## 2015-05-23 VITALS — BP 157/68 | HR 78 | Temp 97.5°F | Resp 18 | Ht 70.0 in | Wt 172.1 lb

## 2015-05-23 DIAGNOSIS — C3411 Malignant neoplasm of upper lobe, right bronchus or lung: Secondary | ICD-10-CM

## 2015-05-23 DIAGNOSIS — Z5111 Encounter for antineoplastic chemotherapy: Secondary | ICD-10-CM

## 2015-05-23 LAB — CBC WITH DIFFERENTIAL/PLATELET
BASO%: 0 % (ref 0.0–2.0)
BASOS ABS: 0 10*3/uL (ref 0.0–0.1)
EOS ABS: 0 10*3/uL (ref 0.0–0.5)
EOS%: 0 % (ref 0.0–7.0)
HEMATOCRIT: 31.7 % — AB (ref 38.4–49.9)
HGB: 10.7 g/dL — ABNORMAL LOW (ref 13.0–17.1)
LYMPH%: 7 % — AB (ref 14.0–49.0)
MCH: 32.1 pg (ref 27.2–33.4)
MCHC: 33.8 g/dL (ref 32.0–36.0)
MCV: 95.2 fL (ref 79.3–98.0)
MONO#: 0.6 10*3/uL (ref 0.1–0.9)
MONO%: 13 % (ref 0.0–14.0)
NEUT#: 3.9 10*3/uL (ref 1.5–6.5)
NEUT%: 80 % — ABNORMAL HIGH (ref 39.0–75.0)
PLATELETS: 256 10*3/uL (ref 140–400)
RBC: 3.33 10*6/uL — AB (ref 4.20–5.82)
RDW: 18.5 % — ABNORMAL HIGH (ref 11.0–14.6)
WBC: 4.8 10*3/uL (ref 4.0–10.3)
lymph#: 0.3 10*3/uL — ABNORMAL LOW (ref 0.9–3.3)
nRBC: 0 % (ref 0–0)

## 2015-05-23 LAB — COMPREHENSIVE METABOLIC PANEL (CC13)
ALK PHOS: 55 U/L (ref 40–150)
ALT: 16 U/L (ref 0–55)
ANION GAP: 11 meq/L (ref 3–11)
AST: 22 U/L (ref 5–34)
Albumin: 3.9 g/dL (ref 3.5–5.0)
BILIRUBIN TOTAL: 0.32 mg/dL (ref 0.20–1.20)
BUN: 20.4 mg/dL (ref 7.0–26.0)
CALCIUM: 10 mg/dL (ref 8.4–10.4)
CHLORIDE: 109 meq/L (ref 98–109)
CO2: 19 meq/L — AB (ref 22–29)
Creatinine: 1 mg/dL (ref 0.7–1.3)
EGFR: 77 mL/min/{1.73_m2} — AB (ref >90)
Glucose: 131 mg/dl (ref 70–140)
Potassium: 4.9 mEq/L (ref 3.5–5.1)
Sodium: 139 mEq/L (ref 136–145)
TOTAL PROTEIN: 6.9 g/dL (ref 6.4–8.3)

## 2015-05-23 MED ORDER — SODIUM CHLORIDE 0.9 % IV SOLN
Freq: Once | INTRAVENOUS | Status: AC
  Administered 2015-05-23: 12:00:00 via INTRAVENOUS

## 2015-05-23 MED ORDER — SODIUM CHLORIDE 0.9 % IV SOLN
471.5000 mg | Freq: Once | INTRAVENOUS | Status: AC
  Administered 2015-05-23: 470 mg via INTRAVENOUS
  Filled 2015-05-23: qty 47

## 2015-05-23 MED ORDER — SODIUM CHLORIDE 0.9 % IV SOLN
Freq: Once | INTRAVENOUS | Status: AC
  Administered 2015-05-23: 13:00:00 via INTRAVENOUS
  Filled 2015-05-23: qty 8

## 2015-05-23 MED ORDER — SODIUM CHLORIDE 0.9 % IV SOLN
520.0000 mg/m2 | Freq: Once | INTRAVENOUS | Status: AC
  Administered 2015-05-23: 1000 mg via INTRAVENOUS
  Filled 2015-05-23: qty 40

## 2015-05-23 NOTE — Telephone Encounter (Signed)
Gave and printd appt sched and avs fo rpt for NOV and dec

## 2015-05-23 NOTE — Patient Instructions (Signed)
[facility] Discharge Instructions for Patients Receiving Chemotherapy  Today you received the following chemotherapy agents alimta, carboplatin  To help prevent nausea and vomiting after your treatment, we encourage you to take your nausea medication    If you develop nausea and vomiting that is not controlled by your nausea medication, call the clinic.   BELOW ARE SYMPTOMS THAT SHOULD BE REPORTED IMMEDIATELY:  *FEVER GREATER THAN 100.5 F  *CHILLS WITH OR WITHOUT FEVER  NAUSEA AND VOMITING THAT IS NOT CONTROLLED WITH YOUR NAUSEA MEDICATION  *UNUSUAL SHORTNESS OF BREATH  *UNUSUAL BRUISING OR BLEEDING  TENDERNESS IN MOUTH AND THROAT WITH OR WITHOUT PRESENCE OF ULCERS  *URINARY PROBLEMS  *BOWEL PROBLEMS  UNUSUAL RASH Items with * indicate a potential emergency and should be followed up as soon as possible.  Feel free to call the clinic you have any questions or concerns. The clinic phone number is [phone].  Please show the [facility] at check-in to the Emergency Department and triage nurse.

## 2015-05-23 NOTE — Progress Notes (Signed)
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  PRIOR THERAPY:  Status post right upper lobectomy with lymph node dissection on 01/30/2015. The Myriad lung cancer prognostic test showed high risk prognostic score of 31 and 25% risk of dying from lung cancer in 5 years. .  CURRENT THERAPY: Adjuvant systemic chemotherapy with carboplatin for AUC of 5 and Alimta 500 MG/M2 every 3 weeks. First dose 03/22/2015. Status post 3 cycles  INTERVAL HISTORY: Donald Delacruz 74 y.o. male returns to the clinic today for follow-up visit accompanied by his wife. The patient tolerated the first cycle of his adjuvant systemic chemotherapy with carboplatin and Alimta fairly well with no significant adverse effects except for neutropenia. He required a few doses of GraniX after his chemotherapy. He denied having any significant chest pain, shortness of breath, cough or hemoptysis. He denied having any significant weight loss or night sweats. He has no nausea or vomiting. He is here today to start cycle #4.  MEDICAL HISTORY: Past Medical History  Diagnosis Date  . Essential hypertension   . ED (erectile dysfunction)   . Insomnia   . Lipoma   . Osteoarthritis   . Emphysema lung ([place])   . Restless legs syndrome   . Coronary artery calcification seen on computed tomography June 2016    CT scan of chest: Coronary artery calcifications are noted  . Thoracic aortic atherosclerosis [facility]) June 2016    CT scan chest: Atherosclerosis of thoracic aorta is noted without aneurysm or dissection. Visualized portion of upper abdomen demonstrates stable calcified splenic artery and right renal  . 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  MEDICATIONS:  Current Outpatient Prescriptions  Medication Sig Dispense Refill  . acetaminophen (TYLENOL) 500 MG tablet Take 1,000 mg by mouth every 4 (four) hours as needed for moderate pain or headache.     [name] aspirin EC 81 MG tablet Take 81 mg by mouth every morning.     . Calcium Carbonate-Vitamin D (CALCIUM 600+D) 600-400 MG-UNIT per tablet Take 1 tablet by mouth every morning.     . clobetasol ointment (TEMOVATE) 0.05 % Clobetasol Propionate 0.05 % External Ointment Apply to rash, and rub in well, twice a day as needed.  Quantity: 1;  Refills: 2   Wilson M.D., [employer] Flavors ;  Start 24-Apr-2010 Active 30 GM Tube    . dexamethasone (DECADRON) 4 MG tablet 4 mg by mouth twice a day the day before, day of and day after the chemotherapy every 3 weeks. 40 tablet 0  . diltiazem (CARDIZEM CD) 180 MG 24 hr capsule Take 180 mg by mouth every morning.     . folic acid (FOLVITE) 1 MG tablet Take 1 tablet (1 mg total) by mouth daily. 30 tablet 2  . gabapentin (NEURONTIN) 300 MG capsule Take 300-600 mg by mouth at bedtime.     [name] lisinopril (PRINIVIL,ZESTRIL) 20 MG tablet Take 20 mg by mouth every morning.     . meloxicam (MOBIC) 15 MG tablet Take 15 mg by mouth daily.    . metoprolol succinate (TOPROL-XL) 100 MG  24 hr tablet Take 100 mg by mouth every morning.     . Multiple Vitamin (MULTIVITAMIN) capsule Take 1 capsule by mouth every morning.     . Omega-3 Fatty Acids (FISH OIL) 1000 MG CAPS Take 1,000 mg by mouth daily.    . prochlorperazine (COMPAZINE) 10 MG tablet Take 1 tablet (10 mg total) by mouth every 6 (six) hours as needed for nausea or vomiting. 60 tablet 0  . sildenafil (VIAGRA) 100 MG tablet Take 100 mg by mouth daily as needed for erectile dysfunction.     . Turmeric 450 MG CAPS Take 500 mg by mouth every morning.     . vardenafil (LEVITRA) 20 MG tablet Take 20 mg  by mouth daily as needed for erectile dysfunction.      No current facility-administered medications for this visit.    SURGICAL HISTORY:  Past Surgical History  Procedure Laterality Date  . Cataract extraction    . Pilonidal cyst excision    . Video bronchoscopy N/A 01/30/2015    Procedure: VIDEO BRONCHOSCOPY;  Surgeon: [name], MD;  Location: [facility] OR;  Service: Thoracic;  Laterality: N/A;  . Video assisted thoracoscopy (vats)/wedge resection Right 01/30/2015    Procedure: Right VIDEO ASSISTED THORACOSCOPY, Right upper Lobectomy with lymph node dissection and ONQ insertion;  Surgeon: [name], MD;  Location: [place];  Service: Thoracic;  Laterality: Right;    REVIEW OF SYSTEMS:  A comprehensive review of systems was negative.   PHYSICAL EXAMINATION: General appearance: alert, cooperative and no distress Head: Normocephalic, without obvious abnormality, atraumatic Neck: no adenopathy, no JVD, supple, symmetrical, trachea midline and thyroid not enlarged, symmetric, no tenderness/mass/nodules Lymph nodes: Cervical, supraclavicular, and axillary nodes normal. Resp: clear to auscultation bilaterally Back: symmetric, no curvature. ROM normal. No CVA tenderness. Cardio: regular rate and rhythm, S1, S2 normal, no murmur, click, rub or gallop GI: soft, non-tender; bowel sounds normal; no masses,  no organomegaly Extremities: extremities normal, atraumatic, no cyanosis or edema Neurologic: Alert and oriented X 3, normal strength and tone. Normal symmetric reflexes. Normal coordination and gait  ECOG PERFORMANCE STATUS: 0 - Asymptomatic  Blood pressure 157/68, pulse 78, temperature 97.5 F (36.4 C), temperature source Oral, resp. rate 18, height _0  (1.778 m), weight 172 lb 1.6 oz (78.064 kg), SpO2 100 %.  LABORATORY DATA: Lab Results  Component Value Date   WBC 4.8 05/23/2015   HGB 10.7* 05/23/2015   HCT 31.7* 05/23/2015   MCV 95.2 05/23/2015   PLT 256 05/23/2015       Chemistry      Component Value Date/Time   NA 140 05/17/2015 1048   NA 137 02/03/2015 0236   K 5.2* 05/17/2015 1048   K 4.4 02/03/2015 0236   CL 103 02/03/2015 0236   CO2 23 05/17/2015 1048   CO2 26 02/03/2015 0236   BUN 18.1 05/17/2015 1048   BUN 21* 02/03/2015 0236   CREATININE 1.0 05/17/2015 1048   CREATININE 1.25* 02/03/2015 0236      Component Value Date/Time   CALCIUM 9.6 05/17/2015 1048   CALCIUM 8.6* 02/03/2015 0236   ALKPHOS 55 05/17/2015 1048   ALKPHOS 33* 02/01/2015 0415   AST 23 05/17/2015 1048   AST 25 02/01/2015 0415   ALT 15 05/17/2015 1048   ALT 16* 02/01/2015 0415   BILITOT <0.30 05/17/2015 1048   BILITOT 0.4 02/01/2015 0415       RADIOGRAPHIC STUDIES: No results found.  ASSESSMENT AND PLAN: This is a very  pleasant 74 years old white male recently diagnosed with a stage IB non-small cell lung cancer, poorly differentiated adenocarcinoma diagnosed in August 2016, status post right upper lobectomy with lymph node dissection and myriad diagnostic tests showed high risk score was 25% risk of dying of lung cancer and 5 years. He is currently undergoing adjuvant systemic chemotherapy with carboplatin and Alimta is status post 3 cycles. He tolerated the first cycle of his treatment fairly well with no significant adverse effects. I recommended for the patient to proceed with cycle #4 today as a scheduled. The patient would come back for follow-up visit in 4 weeks for reevaluation after repeating CT scan of the chest for restaging of his disease. He was advised to call immediately if he has any concerning symptoms in the interval. The patient voices understanding of current disease status and treatment options and is in agreement with the current care plan.  All questions were answered. The patient knows to call the clinic with any problems, questions or concerns. We can certainly see the patient much sooner if necessary.  Disclaimer: This note was dictated with  voice recognition software. Similar sounding words can inadvertently be transcribed and may not be corrected upon review.

## 2015-05-25 ENCOUNTER — Other Ambulatory Visit: Payer: Medicare Other

## 2015-05-25 ENCOUNTER — Ambulatory Visit: Payer: Medicare Other

## 2015-05-25 ENCOUNTER — Ambulatory Visit: Payer: Medicare Other | Admitting: Nurse Practitioner

## 2015-05-30 ENCOUNTER — Other Ambulatory Visit (HOSPITAL_BASED_OUTPATIENT_CLINIC_OR_DEPARTMENT_OTHER): Payer: Medicare Other

## 2015-05-30 ENCOUNTER — Other Ambulatory Visit: Payer: Self-pay | Admitting: *Deleted

## 2015-05-30 ENCOUNTER — Telehealth: Payer: Self-pay | Admitting: Internal Medicine

## 2015-05-30 ENCOUNTER — Ambulatory Visit (HOSPITAL_BASED_OUTPATIENT_CLINIC_OR_DEPARTMENT_OTHER): Payer: Medicare Other

## 2015-05-30 VITALS — BP 152/69 | HR 73 | Temp 98.0°F | Resp 18

## 2015-05-30 DIAGNOSIS — C3410 Malignant neoplasm of upper lobe, unspecified bronchus or lung: Secondary | ICD-10-CM

## 2015-05-30 DIAGNOSIS — C3411 Malignant neoplasm of upper lobe, right bronchus or lung: Secondary | ICD-10-CM

## 2015-05-30 DIAGNOSIS — D701 Agranulocytosis secondary to cancer chemotherapy: Secondary | ICD-10-CM | POA: Diagnosis not present

## 2015-05-30 LAB — COMPREHENSIVE METABOLIC PANEL (CC13)
ALBUMIN: 3.4 g/dL — AB (ref 3.5–5.0)
ALK PHOS: 42 U/L (ref 40–150)
ALT: 14 U/L (ref 0–55)
ANION GAP: 7 meq/L (ref 3–11)
AST: 21 U/L (ref 5–34)
BILIRUBIN TOTAL: 0.45 mg/dL (ref 0.20–1.20)
BUN: 25.7 mg/dL (ref 7.0–26.0)
CO2: 24 mEq/L (ref 22–29)
Calcium: 9 mg/dL (ref 8.4–10.4)
Chloride: 106 mEq/L (ref 98–109)
Creatinine: 0.9 mg/dL (ref 0.7–1.3)
EGFR: 83 mL/min/{1.73_m2} — AB (ref >90)
Glucose: 97 mg/dl (ref 70–140)
Potassium: 5.5 mEq/L — ABNORMAL HIGH (ref 3.5–5.1)
Sodium: 137 mEq/L (ref 136–145)
TOTAL PROTEIN: 6.1 g/dL — AB (ref 6.4–8.3)

## 2015-05-30 LAB — CBC WITH DIFFERENTIAL/PLATELET
BASO%: 0 % (ref 0.0–2.0)
BASOS ABS: 0 10*3/uL (ref 0.0–0.1)
EOS ABS: 0 10*3/uL (ref 0.0–0.5)
EOS%: 1.9 % (ref 0.0–7.0)
HCT: 29.5 % — ABNORMAL LOW (ref 38.4–49.9)
HGB: 10 g/dL — ABNORMAL LOW (ref 13.0–17.1)
LYMPH%: 68.2 % — AB (ref 14.0–49.0)
MCH: 32.3 pg (ref 27.2–33.4)
MCHC: 33.9 g/dL (ref 32.0–36.0)
MCV: 95.2 fL (ref 79.3–98.0)
MONO#: 0.1 10*3/uL (ref 0.1–0.9)
MONO%: 6.5 % (ref 0.0–14.0)
NEUT%: 23.4 % — ABNORMAL LOW (ref 39.0–75.0)
NEUTROS ABS: 0.3 10*3/uL — AB (ref 1.5–6.5)
PLATELETS: 166 10*3/uL (ref 140–400)
RBC: 3.1 10*6/uL — AB (ref 4.20–5.82)
RDW: 17.9 % — ABNORMAL HIGH (ref 11.0–14.6)
WBC: 1.1 10*3/uL — AB (ref 4.0–10.3)
lymph#: 0.7 10*3/uL — ABNORMAL LOW (ref 0.9–3.3)

## 2015-05-30 MED ORDER — TBO-FILGRASTIM 300 MCG/0.5ML ~~LOC~~ SOSY
300.0000 ug | PREFILLED_SYRINGE | Freq: Once | SUBCUTANEOUS | Status: DC
  Filled 2015-05-30: qty 0.5

## 2015-05-30 MED ORDER — TBO-FILGRASTIM 300 MCG/0.5ML ~~LOC~~ SOSY
300.0000 ug | PREFILLED_SYRINGE | Freq: Once | SUBCUTANEOUS | Status: DC
  Administered 2015-05-30: 300 ug via SUBCUTANEOUS
  Filled 2015-05-30: qty 0.5

## 2015-05-30 MED ORDER — TBO-FILGRASTIM 300 MCG/0.5ML ~~LOC~~ SOSY: 300.0000 ug | PREFILLED_SYRINGE | Freq: Once | SUBCUTANEOUS | Status: DC

## 2015-05-30 NOTE — Addendum Note (Signed)
Addended by: Margaret Pyle on: 05/30/2015 10:28 AM   Modules accepted: Orders

## 2015-05-30 NOTE — Telephone Encounter (Signed)
called home and sons # no answer sons # is busy

## 2015-05-30 NOTE — Patient Instructions (Signed)

## 2015-05-31 ENCOUNTER — Ambulatory Visit (HOSPITAL_BASED_OUTPATIENT_CLINIC_OR_DEPARTMENT_OTHER): Payer: Medicare Other

## 2015-05-31 VITALS — BP 157/65 | Temp 98.0°F | Resp 20

## 2015-05-31 DIAGNOSIS — D701 Agranulocytosis secondary to cancer chemotherapy: Secondary | ICD-10-CM

## 2015-05-31 DIAGNOSIS — C3411 Malignant neoplasm of upper lobe, right bronchus or lung: Secondary | ICD-10-CM | POA: Diagnosis not present

## 2015-05-31 DIAGNOSIS — C3410 Malignant neoplasm of upper lobe, unspecified bronchus or lung: Secondary | ICD-10-CM

## 2015-05-31 MED ORDER — TBO-FILGRASTIM 300 MCG/0.5ML ~~LOC~~ SOSY
300.0000 ug | PREFILLED_SYRINGE | Freq: Once | SUBCUTANEOUS | Status: AC
  Administered 2015-05-31: 300 ug via SUBCUTANEOUS
  Filled 2015-05-31: qty 0.5

## 2015-05-31 NOTE — Patient Instructions (Signed)

## 2015-06-06 ENCOUNTER — Other Ambulatory Visit (HOSPITAL_BASED_OUTPATIENT_CLINIC_OR_DEPARTMENT_OTHER): Payer: Medicare Other

## 2015-06-06 ENCOUNTER — Telehealth: Payer: Self-pay | Admitting: Internal Medicine

## 2015-06-06 ENCOUNTER — Other Ambulatory Visit: Payer: Self-pay | Admitting: Medical Oncology

## 2015-06-06 ENCOUNTER — Other Ambulatory Visit: Payer: Self-pay | Admitting: Internal Medicine

## 2015-06-06 ENCOUNTER — Ambulatory Visit (HOSPITAL_BASED_OUTPATIENT_CLINIC_OR_DEPARTMENT_OTHER): Payer: Medicare Other

## 2015-06-06 VITALS — BP 150/73 | HR 79 | Temp 97.7°F

## 2015-06-06 DIAGNOSIS — T451X5A Adverse effect of antineoplastic and immunosuppressive drugs, initial encounter: Principal | ICD-10-CM

## 2015-06-06 DIAGNOSIS — D701 Agranulocytosis secondary to cancer chemotherapy: Secondary | ICD-10-CM

## 2015-06-06 DIAGNOSIS — C3411 Malignant neoplasm of upper lobe, right bronchus or lung: Secondary | ICD-10-CM

## 2015-06-06 LAB — COMPREHENSIVE METABOLIC PANEL
ALBUMIN: 3.6 g/dL (ref 3.5–5.0)
ALK PHOS: 58 U/L (ref 40–150)
ALT: 15 U/L (ref 0–55)
ANION GAP: 11 meq/L (ref 3–11)
AST: 22 U/L (ref 5–34)
BUN: 19.6 mg/dL (ref 7.0–26.0)
CALCIUM: 9.4 mg/dL (ref 8.4–10.4)
CO2: 20 mEq/L — ABNORMAL LOW (ref 22–29)
CREATININE: 1 mg/dL (ref 0.7–1.3)
Chloride: 107 mEq/L (ref 98–109)
EGFR: 72 mL/min/{1.73_m2} — ABNORMAL LOW (ref >90)
Glucose: 99 mg/dl (ref 70–140)
Potassium: 5.2 mEq/L — ABNORMAL HIGH (ref 3.5–5.1)
Sodium: 138 mEq/L (ref 136–145)
Total Bilirubin: 0.36 mg/dL (ref 0.20–1.20)
Total Protein: 6.5 g/dL (ref 6.4–8.3)

## 2015-06-06 LAB — CBC WITH DIFFERENTIAL/PLATELET
BASO%: 0.5 % (ref 0.0–2.0)
Basophils Absolute: 0 10*3/uL (ref 0.0–0.1)
EOS%: 0 % (ref 0.0–7.0)
Eosinophils Absolute: 0 10*3/uL (ref 0.0–0.5)
HCT: 27.1 % — ABNORMAL LOW (ref 38.4–49.9)
HGB: 9.3 g/dL — ABNORMAL LOW (ref 13.0–17.1)
LYMPH#: 0.8 10*3/uL — AB (ref 0.9–3.3)
LYMPH%: 40.4 % (ref 14.0–49.0)
MCH: 32.3 pg (ref 27.2–33.4)
MCHC: 34.3 g/dL (ref 32.0–36.0)
MCV: 94.1 fL (ref 79.3–98.0)
MONO#: 0.5 10*3/uL (ref 0.1–0.9)
MONO%: 26.6 % — ABNORMAL HIGH (ref 0.0–14.0)
NEUT#: 0.7 10*3/uL — ABNORMAL LOW (ref 1.5–6.5)
NEUT%: 32.5 % — ABNORMAL LOW (ref 39.0–75.0)
NRBC: 0 % (ref 0–0)
Platelets: 33 10*3/uL — ABNORMAL LOW (ref 140–400)
RBC: 2.88 10*6/uL — ABNORMAL LOW (ref 4.20–5.82)
RDW: 17.1 % — AB (ref 11.0–14.6)
WBC: 2 10*3/uL — AB (ref 4.0–10.3)

## 2015-06-06 MED ORDER — TBO-FILGRASTIM 300 MCG/0.5ML ~~LOC~~ SOSY
300.0000 ug | PREFILLED_SYRINGE | Freq: Once | SUBCUTANEOUS | Status: AC
  Administered 2015-06-06: 300 ug via SUBCUTANEOUS
  Filled 2015-06-06: qty 0.5

## 2015-06-06 NOTE — Telephone Encounter (Signed)
Gave and printed dec sched

## 2015-06-07 ENCOUNTER — Ambulatory Visit (HOSPITAL_BASED_OUTPATIENT_CLINIC_OR_DEPARTMENT_OTHER): Payer: Medicare Other

## 2015-06-07 VITALS — BP 143/62 | HR 108 | Temp 98.2°F

## 2015-06-07 DIAGNOSIS — D701 Agranulocytosis secondary to cancer chemotherapy: Secondary | ICD-10-CM

## 2015-06-07 DIAGNOSIS — C3411 Malignant neoplasm of upper lobe, right bronchus or lung: Secondary | ICD-10-CM

## 2015-06-07 DIAGNOSIS — T451X5A Adverse effect of antineoplastic and immunosuppressive drugs, initial encounter: Principal | ICD-10-CM

## 2015-06-07 MED ORDER — TBO-FILGRASTIM 300 MCG/0.5ML ~~LOC~~ SOSY
300.0000 ug | PREFILLED_SYRINGE | Freq: Once | SUBCUTANEOUS | Status: AC
  Administered 2015-06-07: 300 ug via SUBCUTANEOUS
  Filled 2015-06-07: qty 0.5

## 2015-06-13 ENCOUNTER — Other Ambulatory Visit (HOSPITAL_BASED_OUTPATIENT_CLINIC_OR_DEPARTMENT_OTHER): Payer: Medicare Other

## 2015-06-13 DIAGNOSIS — C3411 Malignant neoplasm of upper lobe, right bronchus or lung: Secondary | ICD-10-CM

## 2015-06-13 LAB — CBC WITH DIFFERENTIAL/PLATELET
BASO%: 0.3 % (ref 0.0–2.0)
Basophils Absolute: 0 10*3/uL (ref 0.0–0.1)
EOS%: 0.1 % (ref 0.0–7.0)
Eosinophils Absolute: 0 10*3/uL (ref 0.0–0.5)
HCT: 26.4 % — ABNORMAL LOW (ref 38.4–49.9)
HEMOGLOBIN: 8.7 g/dL — AB (ref 13.0–17.1)
LYMPH%: 7.7 % — AB (ref 14.0–49.0)
MCH: 32 pg (ref 27.2–33.4)
MCHC: 32.8 g/dL (ref 32.0–36.0)
MCV: 97.5 fL (ref 79.3–98.0)
MONO#: 2 10*3/uL — ABNORMAL HIGH (ref 0.1–0.9)
MONO%: 22.6 % — AB (ref 0.0–14.0)
NEUT%: 69.3 % (ref 39.0–75.0)
NEUTROS ABS: 6.1 10*3/uL (ref 1.5–6.5)
Platelets: 304 10*3/uL (ref 140–400)
RBC: 2.71 10*6/uL — AB (ref 4.20–5.82)
RDW: 19.5 % — AB (ref 11.0–14.6)
WBC: 8.8 10*3/uL (ref 4.0–10.3)
lymph#: 0.7 10*3/uL — ABNORMAL LOW (ref 0.9–3.3)

## 2015-06-13 LAB — COMPREHENSIVE METABOLIC PANEL
ALBUMIN: 3.3 g/dL — AB (ref 3.5–5.0)
ALK PHOS: 55 U/L (ref 40–150)
ALT: 18 U/L (ref 0–55)
AST: 26 U/L (ref 5–34)
Anion Gap: 11 mEq/L (ref 3–11)
BILIRUBIN TOTAL: 0.4 mg/dL (ref 0.20–1.20)
BUN: 23.8 mg/dL (ref 7.0–26.0)
CO2: 20 meq/L — AB (ref 22–29)
Calcium: 9 mg/dL (ref 8.4–10.4)
Chloride: 102 mEq/L (ref 98–109)
Creatinine: 1.6 mg/dL — ABNORMAL HIGH (ref 0.7–1.3)
EGFR: 42 mL/min/{1.73_m2} — AB (ref >90)
GLUCOSE: 109 mg/dL (ref 70–140)
POTASSIUM: 4.6 meq/L (ref 3.5–5.1)
SODIUM: 133 meq/L — AB (ref 136–145)
TOTAL PROTEIN: 6.7 g/dL (ref 6.4–8.3)

## 2015-06-18 ENCOUNTER — Encounter (HOSPITAL_COMMUNITY): Payer: Self-pay

## 2015-06-18 ENCOUNTER — Ambulatory Visit (HOSPITAL_COMMUNITY): Payer: Medicare Other

## 2015-06-18 ENCOUNTER — Ambulatory Visit (HOSPITAL_COMMUNITY)
Admission: RE | Admit: 2015-06-18 | Discharge: 2015-06-18 | Disposition: A | Discharge status: Home / Self Care | Payer: Medicare Other | Source: Ambulatory Visit | Attending: Internal Medicine | Admitting: Internal Medicine

## 2015-06-18 DIAGNOSIS — N281 Cyst of kidney, acquired: Secondary | ICD-10-CM | POA: Insufficient documentation

## 2015-06-18 DIAGNOSIS — D3502 Benign neoplasm of left adrenal gland: Secondary | ICD-10-CM | POA: Insufficient documentation

## 2015-06-18 DIAGNOSIS — I700 Atherosclerosis of aorta: Secondary | ICD-10-CM | POA: Insufficient documentation

## 2015-06-18 DIAGNOSIS — I722 Aneurysm of renal artery: Secondary | ICD-10-CM | POA: Insufficient documentation

## 2015-06-18 DIAGNOSIS — Z85118 Personal history of other malignant neoplasm of bronchus and lung: Secondary | ICD-10-CM | POA: Diagnosis not present

## 2015-06-18 DIAGNOSIS — Z08 Encounter for follow-up examination after completed treatment for malignant neoplasm: Secondary | ICD-10-CM | POA: Diagnosis not present

## 2015-06-18 DIAGNOSIS — Z9889 Other specified postprocedural states: Secondary | ICD-10-CM | POA: Diagnosis not present

## 2015-06-18 DIAGNOSIS — C3411 Malignant neoplasm of upper lobe, right bronchus or lung: Secondary | ICD-10-CM

## 2015-06-18 MED ORDER — IOHEXOL 300 MG/ML  SOLN
75.0000 mL | Freq: Once | INTRAMUSCULAR | Status: AC | PRN
  Administered 2015-06-18: 60 mL via INTRAVENOUS

## 2015-06-19 ENCOUNTER — Other Ambulatory Visit: Payer: Self-pay | Admitting: *Deleted

## 2015-06-19 DIAGNOSIS — C3410 Malignant neoplasm of upper lobe, unspecified bronchus or lung: Secondary | ICD-10-CM

## 2015-06-20 ENCOUNTER — Encounter: Payer: Self-pay | Admitting: Internal Medicine

## 2015-06-20 ENCOUNTER — Telehealth: Payer: Self-pay | Admitting: Internal Medicine

## 2015-06-20 ENCOUNTER — Other Ambulatory Visit (HOSPITAL_BASED_OUTPATIENT_CLINIC_OR_DEPARTMENT_OTHER): Payer: Medicare Other

## 2015-06-20 ENCOUNTER — Ambulatory Visit (HOSPITAL_BASED_OUTPATIENT_CLINIC_OR_DEPARTMENT_OTHER): Payer: Medicare Other | Admitting: Internal Medicine

## 2015-06-20 VITALS — BP 136/64 | HR 87 | Temp 98.6°F | Resp 18 | Ht 70.0 in | Wt 173.6 lb

## 2015-06-20 DIAGNOSIS — C3411 Malignant neoplasm of upper lobe, right bronchus or lung: Secondary | ICD-10-CM

## 2015-06-20 DIAGNOSIS — D649 Anemia, unspecified: Secondary | ICD-10-CM | POA: Diagnosis not present

## 2015-06-20 DIAGNOSIS — D539 Nutritional anemia, unspecified: Secondary | ICD-10-CM | POA: Insufficient documentation

## 2015-06-20 DIAGNOSIS — C3410 Malignant neoplasm of upper lobe, unspecified bronchus or lung: Secondary | ICD-10-CM

## 2015-06-20 LAB — CBC WITH DIFFERENTIAL/PLATELET
BASO%: 0.6 % (ref 0.0–2.0)
BASOS ABS: 0 10*3/uL (ref 0.0–0.1)
EOS%: 0.2 % (ref 0.0–7.0)
Eosinophils Absolute: 0 10*3/uL (ref 0.0–0.5)
HEMATOCRIT: 27.2 % — AB (ref 38.4–49.9)
HGB: 8.9 g/dL — ABNORMAL LOW (ref 13.0–17.1)
LYMPH%: 25.4 % (ref 14.0–49.0)
MCH: 32.4 pg (ref 27.2–33.4)
MCHC: 32.8 g/dL (ref 32.0–36.0)
MCV: 98.6 fL — AB (ref 79.3–98.0)
MONO#: 1 10*3/uL — AB (ref 0.1–0.9)
MONO%: 20.6 % — ABNORMAL HIGH (ref 0.0–14.0)
NEUT#: 2.5 10*3/uL (ref 1.5–6.5)
NEUT%: 53.2 % (ref 39.0–75.0)
PLATELETS: 506 10*3/uL — AB (ref 140–400)
RBC: 2.76 10*6/uL — AB (ref 4.20–5.82)
RDW: 19.1 % — ABNORMAL HIGH (ref 11.0–14.6)
WBC: 4.6 10*3/uL (ref 4.0–10.3)
lymph#: 1.2 10*3/uL (ref 0.9–3.3)

## 2015-06-20 LAB — COMPREHENSIVE METABOLIC PANEL
ALT: 18 U/L (ref 0–55)
ANION GAP: 9 meq/L (ref 3–11)
AST: 24 U/L (ref 5–34)
Albumin: 3.3 g/dL — ABNORMAL LOW (ref 3.5–5.0)
Alkaline Phosphatase: 55 U/L (ref 40–150)
BUN: 20.5 mg/dL (ref 7.0–26.0)
CALCIUM: 9.5 mg/dL (ref 8.4–10.4)
CHLORIDE: 109 meq/L (ref 98–109)
CO2: 23 meq/L (ref 22–29)
Creatinine: 1.3 mg/dL (ref 0.7–1.3)
EGFR: 56 mL/min/{1.73_m2} — AB (ref >90)
Glucose: 100 mg/dl (ref 70–140)
POTASSIUM: 5.6 meq/L — AB (ref 3.5–5.1)
Sodium: 141 mEq/L (ref 136–145)
Total Bilirubin: <0.3 mg/dL (ref 0.20–1.20)
Total Protein: 6.5 g/dL (ref 6.4–8.3)

## 2015-06-20 NOTE — Telephone Encounter (Signed)
Gave patient avs report and appointments for June. [facility] will contact patient re ct - patient aware.

## 2015-06-20 NOTE — Progress Notes (Signed)
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  PRIOR THERAPY:   1) Status post right upper lobectomy with lymph node dissection on 01/30/2015. The Myriad lung cancer prognostic test showed high risk prognostic score of 31 and 25% risk of dying from lung cancer in 5 years. 2) Adjuvant systemic chemotherapy with carboplatin for AUC of 5 and Alimta 500 MG/M2 every 3 weeks. First dose 03/22/2015. Status post 4 cycles.  CURRENT THERAPY: Observation.  INTERVAL HISTORY: Donald Delacruz 74 y.o. male returns to the clinic today for follow-up visit accompanied by his wife. The patient has a course of adjuvant systemic chemotherapy with carboplatin and Alimta for 4 cycles and tolerated it fairly well with no significant adverse effects except for neutropenia requiring few doses of GraniX after his chemotherapy. He denied having any significant chest pain, shortness of breath, cough or hemoptysis. He denied having any significant weight loss or night sweats. He has no nausea or vomiting. He has no fever or chills. The patient had repeat CT scan of the chest performed recently and he is here for evaluation and discussion of his scan results.  MEDICAL HISTORY: Past Medical History  Diagnosis Date  . Essential hypertension   . ED (erectile dysfunction)   . Insomnia   . Lipoma   . Osteoarthritis   . Emphysema lung ([place])   . Restless legs syndrome   . Coronary artery calcification seen on computed tomography June 2016    CT scan of chest: Coronary artery calcifications are noted  . Thoracic aortic atherosclerosis [facility]) June 2016    CT scan chest: Atherosclerosis of thoracic aorta is noted without aneurysm or dissection.  Visualized portion of upper abdomen demonstrates stable calcified splenic artery and right renal  . 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  MEDICATIONS:  Current Outpatient Prescriptions  Medication Sig Dispense Refill  . acetaminophen (TYLENOL) 500 MG tablet Take 1,000 mg by mouth every 4 (four) hours as needed for moderate pain or headache.     [name] aspirin EC 81 MG tablet Take 81 mg by mouth every morning.     . clobetasol ointment (TEMOVATE) 0.05 % Clobetasol Propionate 0.05 % External Ointment Apply to rash, and rub in well, twice a day as needed.  Quantity: 1;  Refills: 2   Wilson M.D., Jama Flavors ;  Start 24-Apr-2010 Active 30 GM Tube    . diltiazem (CARDIZEM CD) 180 MG 24 hr capsule Take 180 mg by mouth every morning.     . folic acid (FOLVITE) 1 MG tablet Take 1 tablet (1 mg total) by mouth daily. 30 tablet 2  . gabapentin (NEURONTIN) 300 MG capsule Take 300-600 mg by mouth at bedtime.     [name] lisinopril (PRINIVIL,ZESTRIL) 20 MG tablet Take 20 mg by mouth every morning.     . meloxicam (MOBIC) 15 MG tablet Take 15 mg by mouth daily.    . metoprolol succinate (TOPROL-XL) 100 MG 24 hr tablet Take 100 mg by mouth every morning.     . Multiple Vitamin (MULTIVITAMIN) capsule Take 1 capsule by mouth every  morning.     . Omega-3 Fatty Acids (FISH OIL) 1000 MG CAPS Take 1,000 mg by mouth daily.    . Turmeric 450 MG CAPS Take 500 mg by mouth every morning.     . prochlorperazine (COMPAZINE) 10 MG tablet Take 1 tablet (10 mg total) by mouth every 6 (six) hours as needed for nausea or vomiting. (Patient not taking: Reported on 06/20/2015) 60 tablet 0  . sildenafil (VIAGRA) 100 MG tablet Take 100 mg by mouth daily as needed for erectile dysfunction.     . vardenafil (LEVITRA) 20 MG tablet Take 20 mg by mouth daily as needed for erectile dysfunction.      No current  facility-administered medications for this visit.    SURGICAL HISTORY:  Past Surgical History  Procedure Laterality Date  . Cataract extraction    . Pilonidal cyst excision    . Video bronchoscopy N/A 01/30/2015    Procedure: VIDEO BRONCHOSCOPY;  Surgeon: [name], MD;  Location: [facility] OR;  Service: Thoracic;  Laterality: N/A;  . Video assisted thoracoscopy (vats)/wedge resection Right 01/30/2015    Procedure: Right VIDEO ASSISTED THORACOSCOPY, Right upper Lobectomy with lymph node dissection and ONQ insertion;  Surgeon: [name], MD;  Location: [place];  Service: Thoracic;  Laterality: Right;    REVIEW OF SYSTEMS:  Constitutional: positive for fatigue Eyes: negative Ears, nose, mouth, throat, and face: negative Respiratory: negative Cardiovascular: negative Gastrointestinal: negative Genitourinary:negative Integument/breast: negative Hematologic/lymphatic: negative Musculoskeletal:negative Neurological: negative Behavioral/Psych: negative Endocrine: negative Allergic/Immunologic: negative   PHYSICAL EXAMINATION: General appearance: alert, cooperative and no distress Head: Normocephalic, without obvious abnormality, atraumatic Neck: no adenopathy, no JVD, supple, symmetrical, trachea midline and thyroid not enlarged, symmetric, no tenderness/mass/nodules Lymph nodes: Cervical, supraclavicular, and axillary nodes normal. Resp: clear to auscultation bilaterally Back: symmetric, no curvature. ROM normal. No CVA tenderness. Cardio: regular rate and rhythm, S1, S2 normal, no murmur, click, rub or gallop GI: soft, non-tender; bowel sounds normal; no masses,  no organomegaly Extremities: extremities normal, atraumatic, no cyanosis or edema Neurologic: Alert and oriented X 3, normal strength and tone. Normal symmetric reflexes. Normal coordination and gait  ECOG PERFORMANCE STATUS: 0 - Asymptomatic  Blood pressure 136/64, pulse 87, temperature 98.6 F (37 C), temperature  source Oral, resp. rate 18, height 5' 10"  (1.778 m), weight 173 lb 9.6 oz (78.744 kg), SpO2 99 %.  LABORATORY DATA: Lab Results  Component Value Date   WBC 4.6 06/20/2015   HGB 8.9* 06/20/2015   HCT 27.2* 06/20/2015   MCV 98.6* 06/20/2015   PLT 506* 06/20/2015      Chemistry      Component Value Date/Time   NA 133* 06/13/2015 0951   NA 137 02/03/2015 0236   K 4.6 06/13/2015 0951   K 4.4 02/03/2015 0236   CL 103 02/03/2015 0236   CO2 20* 06/13/2015 0951   CO2 26 02/03/2015 0236   BUN 23.8 06/13/2015 0951   BUN 21* 02/03/2015 0236   CREATININE 1.6* 06/13/2015 0951   CREATININE 1.25* 02/03/2015 0236      Component Value Date/Time   CALCIUM 9.0 06/13/2015 0951   CALCIUM 8.6* 02/03/2015 0236   ALKPHOS 55 06/13/2015 0951   ALKPHOS 33* 02/01/2015 0415   AST 26 06/13/2015 0951   AST 25 02/01/2015 0415   ALT 18 06/13/2015 0951   ALT 16* 02/01/2015 0415   BILITOT 0.40 06/13/2015 0951   BILITOT 0.4 02/01/2015 0415       RADIOGRAPHIC STUDIES: Ct Chest W Contrast  06/18/2015  CLINICAL DATA:  Restaging lung cancer. Initial diagnosis June 2016. Chemotherapy complete November 2016. History right lung surgery. EXAM: CT CHEST WITH CONTRAST TECHNIQUE: Multidetector CT imaging of the chest was performed during intravenous contrast administration. CONTRAST:  73m OMNIPAQUE IOHEXOL 300 MG/ML  SOLN COMPARISON:  Chest CT 11/29/2014 and PET-CT 12/06/2014 FINDINGS: Mediastinum/Nodes: No chest wall mass, supraclavicular or axillary lymphadenopathy. The thyroid gland is normal. The heart is normal in size. No pericardial effusion. Stable aortic and branch vessel atherosclerotic calcifications. Stable coronary artery calcifications. No mediastinal or hilar mass or lymphadenopathy. The esophagus is grossly normal. Lungs/Pleura: Stable surgical changes from a right upper lobe lobectomy. No findings for residual or recurrent tumor. Stable emphysematous changes. No worrisome pulmonary nodules to suggest  metastatic disease. No acute pulmonary findings. No pleural effusion. Upper abdomen: No significant upper abdominal findings. 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 Stable small left adrenal gland nodule consistent with benign adenoma. Musculoskeletal: Stable advanced degenerative changes involving the thoracic spine but no destructive bone lesions to suggest metastatic disease. IMPRESSION: Surgical changes from a prior right upper lobe lobectomy. No findings for residual, recurrent or metastatic tumor. Stable emphysematous changes but no acute pulmonary findings or new pulmonary nodules. No mediastinal or hilar mass or adenopathy. 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 Electronically Signed   By: PMarijo SanesM.D.   On: 06/18/2015 13:34    ASSESSMENT AND PLAN: This is a very pleasant 74years old white male recently diagnosed with a stage IB non-small cell lung cancer, poorly differentiated adenocarcinoma diagnosed in August 2016, status post right upper lobectomy with lymph node dissection and myriad diagnostic tests showed high risk score was 25% risk of dying of lung cancer and 5 years. He underwent a course of adjuvant systemic chemotherapy with carboplatin and Alimta status post 4 cycles. He tolerated his treatment fairly well with no significant adverse effects. The recent CT scan of the chest showed no evidence for disease recurrence. I discussed the scan results with the patient and his wife. I recommended for him to continue on observation with repeat CT scan of the chest in 4 months. For the persistent anemia, I recommended for the patient to start taking over-the-counter oral iron tablets with vitamin C twice a day. He was advised to call immediately if he has any concerning symptoms in the interval. The patient voices understanding of current disease status and treatment options and is in agreement with the current  care plan.  All questions were answered. The patient knows to call the clinic with any problems, questions or concerns. We can certainly see the patient much sooner if necessary.  Disclaimer: This note was dictated with voice recognition software. Similar sounding words can inadvertently be transcribed and may not be corrected upon review.

## 2015-06-21 ENCOUNTER — Encounter: Payer: Medicare Other | Admitting: Cardiothoracic Surgery

## 2015-07-12 ENCOUNTER — Ambulatory Visit (INDEPENDENT_AMBULATORY_CARE_PROVIDER_SITE_OTHER): Payer: Medicare Other | Admitting: Cardiothoracic Surgery

## 2015-07-12 ENCOUNTER — Encounter: Payer: Self-pay | Admitting: Cardiothoracic Surgery

## 2015-07-12 ENCOUNTER — Encounter: Payer: Medicare Other | Admitting: Cardiothoracic Surgery

## 2015-07-12 VITALS — BP 146/83 | HR 83 | Resp 16 | Ht 70.0 in | Wt 173.0 lb

## 2015-07-12 DIAGNOSIS — C3411 Malignant neoplasm of upper lobe, right bronchus or lung: Secondary | ICD-10-CM

## 2015-07-12 DIAGNOSIS — Z902 Acquired absence of lung [part of]: Secondary | ICD-10-CM

## 2015-07-12 NOTE — Patient Instructions (Signed)
folow up 6 months

## 2015-07-12 NOTE — Progress Notes (Signed)
[address]       [place] [address]             [phone]                  Emilliano Marxen [place] Medical Record #[id number] Date of Birth: 17-Aug-1940  Referring MW:[id number], Jama Flavors, MD Primary Cardiology: Primary Care:WILSON,FRED Mallie Mussel, MD  Chief Complaint:  Follow Up Visit 01/30/2015  OPERATIVE REPORT PREOPERATIVE DIAGNOSIS: Adenocarcinoma, right upper lobe. POSTOPERATIVE DIAGNOSIS: Adenocarcinoma, right upper lobe. SURGICAL PROCEDURE: Video bronchoscopy, right video-assisted thoracoscopy with right upper lobectomy, lymph node dissection, and placement of On-Q. SURGEON: Lanelle Bal, MD  Lung cancer, right upper lobe   Staging form: Lung, AJCC 7th Edition     Clinical stage from 01/08/2015: Stage IIB (T3(2), N0, M0) - Signed by [name], MD on 02/02/2015     Pathologic stage from 02/02/2015: Stage IB (T2a, N0, cM0) - Signed by [name], MD on 02/02/2015  Myriad Prognostic score 31  -  25% 5 year mortality  PRIOR THERAPY:  1) Status post right upper lobectomy with lymph node dissection on 01/30/2015. The Myriad lung cancer prognostic test showed high risk prognostic score of 31 and 25% risk of dying from lung cancer in 5 years. 2) Adjuvant systemic chemotherapy with carboplatin for AUC of 5 and Alimta 500 MG/M2 every 3 weeks. First dose 03/22/2015. Status post 4 cycles.  CURRENT THERAPY: Observation.   History of Present Illness:    The patient is seen in routine follow-up after RUL lobectomy as described above. He has finished chemotherapy. Most recent scan is clean of recurrence or metastatic findings. He is feeling better over time with some mild chronic SOB. He has had no recent cough or URI sx.    Zubrod Score: At the time of surgery this patient's most appropriate activity status/level should be described as: _0     0    Normal activity, no symptoms _1     1    Restricted in physical strenuous activity but  ambulatory, able to do out light work _2     2    Ambulatory and capable of self care, unable to do work activities, up and about                 >50 % of waking hours                                                                                   _3     3    Only limited self care, in bed greater than 50% of waking hours _4     4    Completely disabled, no self care, confined to bed or chair _5     5    Moribund  History  Smoking status  . Former Smoker -- 1.00 packs/day for 39 years  . Types: Cigarettes  . Quit date: 06/30/1997  Smokeless tobacco  . Never Used    Comment: SMOKED PIPES ALSO       Allergies  Allergen Reactions  . Amlodipine Swelling  . Benicar [Olmesartan]     Dizziness,malaise   . Nisoldipine  Swelling    Current Outpatient Prescriptions  Medication Sig Dispense Refill  . acetaminophen (TYLENOL) 500 MG tablet Take 1,000 mg by mouth every 4 (four) hours as needed for moderate pain or headache.     [name] aspirin EC 81 MG tablet Take 81 mg by mouth every morning.     . clobetasol ointment (TEMOVATE) 0.05 % Clobetasol Propionate 0.05 % External Ointment Apply to rash, and rub in well, twice a day as needed.  Quantity: 1;  Refills: 2   Wilson M.D., Jama Flavors ;  Start 24-Apr-2010 Active 30 GM Tube    . diltiazem (CARDIZEM CD) 180 MG 24 hr capsule Take 180 mg by mouth every morning.     . ferrous sulfate 325 (65 FE) MG EC tablet Take 325 mg by mouth daily.    . folic acid (FOLVITE) 1 MG tablet Take 1 tablet (1 mg total) by mouth daily. 30 tablet 2  . gabapentin (NEURONTIN) 300 MG capsule Take 300-600 mg by mouth at bedtime.     [name] lisinopril (PRINIVIL,ZESTRIL) 20 MG tablet Take 20 mg by mouth every morning.     . meloxicam (MOBIC) 15 MG tablet Take 15 mg by mouth daily.    . metoprolol succinate (TOPROL-XL) 100 MG 24 hr tablet Take 100 mg by mouth every morning.     . Multiple Vitamin (MULTIVITAMIN) capsule Take 1 capsule by mouth every morning.     . Omega-3 Fatty Acids (FISH  OIL) 1000 MG CAPS Take 1,000 mg by mouth daily.    . sildenafil (VIAGRA) 100 MG tablet Take 100 mg by mouth daily as needed for erectile dysfunction.     . Turmeric 450 MG CAPS Take 500 mg by mouth every morning.     . vardenafil (LEVITRA) 20 MG tablet Take 20 mg by mouth daily as needed for erectile dysfunction.     . vitamin C (ASCORBIC ACID) 500 MG tablet Take 500 mg by mouth daily.     No current facility-administered medications for this visit.       Physical Exam: BP 146/83 mmHg  Pulse 83  Resp 16  Ht _0  (1.778 m)  Wt 173 lb (78.472 kg)  BMI 24.82 kg/m2  SpO2 99%  Physical exam General : NAD Lungs: clear BS Cor: RRR, S1S2 normal, no murmur Abd: soft, nontender, nondistended Ext: Warm and well perfused Incisions: well healed  Diagnostic Studies & Laboratory data:         Recent Radiology Findings: Ct Chest W Contrast  06/18/2015  CLINICAL DATA:  Restaging lung cancer. Initial diagnosis June 2016. Chemotherapy complete November 2016. History right lung surgery. EXAM: CT CHEST WITH CONTRAST TECHNIQUE: Multidetector CT imaging of the chest was performed during intravenous contrast administration. CONTRAST:  23m OMNIPAQUE IOHEXOL 300 MG/ML  SOLN COMPARISON:  Chest CT 11/29/2014 and PET-CT 12/06/2014 FINDINGS: Mediastinum/Nodes: No chest wall mass, supraclavicular or axillary lymphadenopathy. The thyroid gland is normal. The heart is normal in size. No pericardial effusion. Stable aortic and branch vessel atherosclerotic calcifications. Stable coronary artery calcifications. No mediastinal or hilar mass or lymphadenopathy. The esophagus is grossly normal. Lungs/Pleura: Stable surgical changes from a right upper lobe lobectomy. No findings for residual or recurrent tumor. Stable emphysematous changes. No worrisome pulmonary nodules to suggest metastatic disease. No acute pulmonary findings. No pleural effusion. Upper abdomen: No significant upper abdominal findings. Stable  splenic and renal artery calcified aneurysms. Stable small left adrenal gland nodule consistent with benign adenoma. Musculoskeletal: Stable advanced degenerative changes  involving the thoracic spine but no destructive bone lesions to suggest metastatic disease. IMPRESSION: Surgical changes from a prior right upper lobe lobectomy. No findings for residual, recurrent or metastatic tumor. Stable emphysematous changes but no acute pulmonary findings or new pulmonary nodules. No mediastinal or hilar mass or adenopathy. 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 Electronically Signed   By: Marijo Sanes M.D.   On: 06/18/2015 13:34     I have independently reviewed the above radiology findings and reviewed findings  with the patient.  Recent Labs: Lab Results  Component Value Date   WBC 4.6 06/20/2015   HGB 8.9* 06/20/2015   HCT 27.2* 06/20/2015   PLT 506* 06/20/2015   GLUCOSE 100 06/20/2015   ALT 18 06/20/2015   AST 24 06/20/2015   NA 141 06/20/2015   K 5.6* 06/20/2015   CL 103 02/03/2015   CREATININE 1.3 06/20/2015   BUN 20.5 06/20/2015   CO2 23 06/20/2015   INR 0.99 01/26/2015      Assessment / Plan:   Patient stable early postoperatively taking continued improvement in physical activity. Doing well postoperatively, and has tolerated his first round of chemotherapy Plan to see him back in 8 months ,  ct of chest ordered by oncology in 6 months    [name] 07/12/2015 10:45 AM

## 2015-12-17 ENCOUNTER — Other Ambulatory Visit (HOSPITAL_BASED_OUTPATIENT_CLINIC_OR_DEPARTMENT_OTHER): Payer: Medicare Other

## 2015-12-17 ENCOUNTER — Ambulatory Visit (HOSPITAL_COMMUNITY)
Admission: RE | Admit: 2015-12-17 | Discharge: 2015-12-17 | Disposition: A | Discharge status: Home / Self Care | Payer: Medicare Other | Source: Ambulatory Visit | Attending: Internal Medicine | Admitting: Internal Medicine

## 2015-12-17 DIAGNOSIS — C3411 Malignant neoplasm of upper lobe, right bronchus or lung: Secondary | ICD-10-CM

## 2015-12-17 DIAGNOSIS — J439 Emphysema, unspecified: Secondary | ICD-10-CM | POA: Diagnosis not present

## 2015-12-17 DIAGNOSIS — R911 Solitary pulmonary nodule: Secondary | ICD-10-CM | POA: Insufficient documentation

## 2015-12-17 DIAGNOSIS — Z902 Acquired absence of lung [part of]: Secondary | ICD-10-CM | POA: Insufficient documentation

## 2015-12-17 LAB — COMPREHENSIVE METABOLIC PANEL
ALT: 19 U/L (ref 0–55)
ANION GAP: 8 meq/L (ref 3–11)
AST: 21 U/L (ref 5–34)
Albumin: 4 g/dL (ref 3.5–5.0)
Alkaline Phosphatase: 41 U/L (ref 40–150)
BUN: 28.7 mg/dL — ABNORMAL HIGH (ref 7.0–26.0)
CHLORIDE: 109 meq/L (ref 98–109)
CO2: 23 meq/L (ref 22–29)
Calcium: 9.8 mg/dL (ref 8.4–10.4)
Creatinine: 1.3 mg/dL (ref 0.7–1.3)
EGFR: 55 mL/min/{1.73_m2} — AB (ref >90)
Glucose: 103 mg/dl (ref 70–140)
POTASSIUM: 5.8 meq/L — AB (ref 3.5–5.1)
Sodium: 140 mEq/L (ref 136–145)
Total Bilirubin: 0.67 mg/dL (ref 0.20–1.20)
Total Protein: 6.9 g/dL (ref 6.4–8.3)

## 2015-12-17 LAB — CBC WITH DIFFERENTIAL/PLATELET
BASO%: 0.5 % (ref 0.0–2.0)
BASOS ABS: 0 10*3/uL (ref 0.0–0.1)
EOS ABS: 0.1 10*3/uL (ref 0.0–0.5)
EOS%: 1.2 % (ref 0.0–7.0)
HCT: 36.3 % — ABNORMAL LOW (ref 38.4–49.9)
HGB: 12.4 g/dL — ABNORMAL LOW (ref 13.0–17.1)
LYMPH%: 33.9 % (ref 14.0–49.0)
MCH: 33.7 pg — AB (ref 27.2–33.4)
MCHC: 34.2 g/dL (ref 32.0–36.0)
MCV: 98.6 fL — AB (ref 79.3–98.0)
MONO#: 0.5 10*3/uL (ref 0.1–0.9)
MONO%: 11.8 % (ref 0.0–14.0)
NEUT#: 2.1 10*3/uL (ref 1.5–6.5)
NEUT%: 52.6 % (ref 39.0–75.0)
PLATELETS: 147 10*3/uL (ref 140–400)
RBC: 3.68 10*6/uL — AB (ref 4.20–5.82)
RDW: 14.3 % (ref 11.0–14.6)
WBC: 4.1 10*3/uL (ref 4.0–10.3)
lymph#: 1.4 10*3/uL (ref 0.9–3.3)

## 2015-12-17 MED ORDER — IOPAMIDOL (ISOVUE-300) INJECTION 61%
75.0000 mL | Freq: Once | INTRAVENOUS | Status: AC | PRN
  Administered 2015-12-17: 75 mL via INTRAVENOUS

## 2015-12-24 ENCOUNTER — Encounter: Payer: Self-pay | Admitting: Internal Medicine

## 2015-12-24 ENCOUNTER — Telehealth: Payer: Self-pay | Admitting: Internal Medicine

## 2015-12-24 ENCOUNTER — Ambulatory Visit (HOSPITAL_BASED_OUTPATIENT_CLINIC_OR_DEPARTMENT_OTHER): Payer: Medicare Other | Admitting: Internal Medicine

## 2015-12-24 VITALS — BP 166/94 | HR 95 | Temp 98.2°F | Resp 18 | Ht 70.0 in | Wt 174.3 lb

## 2015-12-24 DIAGNOSIS — C3411 Malignant neoplasm of upper lobe, right bronchus or lung: Secondary | ICD-10-CM | POA: Insufficient documentation

## 2015-12-24 DIAGNOSIS — D649 Anemia, unspecified: Secondary | ICD-10-CM

## 2015-12-24 NOTE — Progress Notes (Signed)
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  PRIOR THERAPY:   1) Status post right upper lobectomy with lymph node dissection on 01/30/2015. The Myriad lung cancer prognostic test showed high risk prognostic score of 31 and 25% risk of dying from lung cancer in 5 years. 2) Adjuvant systemic chemotherapy with carboplatin for AUC of 5 and Alimta 500 MG/M2 every 3 weeks. First dose 03/22/2015. Status post 4 cycles.  CURRENT THERAPY: Observation.  INTERVAL HISTORY: Donald Delacruz 75 y.o. male returns to the clinic today for follow-up visit accompanied by his wife. The patient is feeling fine today with no specific complaints except for shortness of breath with exertion. He has been on observation for the last 6 months after completion of his adjuvant systemic chemotherapy.[name] He denied having any significant chest pain, cough or hemoptysis. He denied having any significant weight loss or night sweats. He has no nausea or vomiting. He has no fever or chills. The patient had repeat CT scan of the chest performed recently and he is here for evaluation and discussion of his scan results.  MEDICAL HISTORY: Past Medical History  Diagnosis Date  . Essential hypertension   . ED (erectile dysfunction)   . Insomnia   . Lipoma   . Osteoarthritis   . Emphysema lung ([place])   . Restless legs syndrome   . Coronary artery calcification seen on computed tomography June 2016    CT scan of chest: Coronary artery calcifications are noted  . Thoracic aortic atherosclerosis [facility]) June 2016    CT scan chest: Atherosclerosis of thoracic aorta is noted without aneurysm or dissection. Visualized portion of upper abdomen demonstrates  stable calcified splenic artery and right renal  . 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  MEDICATIONS:  Current Outpatient Prescriptions  Medication Sig Dispense Refill  . acetaminophen (TYLENOL) 500 MG tablet Take 1,000 mg by mouth every 4 (four) hours as needed for moderate pain or headache.     [name] aspirin EC 81 MG tablet Take 81 mg by mouth every morning.     . clobetasol ointment (TEMOVATE) 0.05 % Clobetasol Propionate 0.05 % External Ointment Apply to rash, and rub in well, twice a day as needed.  Quantity: 1;  Refills: 2   Wilson M.D., Jama Flavors ;  Start 24-Apr-2010 Active 30 GM Tube    . diltiazem (CARDIZEM CD) 180 MG 24 hr capsule Take 180 mg by mouth every morning.     . ferrous sulfate 325 (65 FE) MG EC tablet Take 325 mg by mouth daily.    . folic acid (FOLVITE) 1 MG tablet Take 1 tablet (1 mg total) by mouth daily. 30 tablet 2  . gabapentin (NEURONTIN) 300 MG capsule Take 300-600 mg by mouth at bedtime.     [name] lisinopril (PRINIVIL,ZESTRIL) 20 MG tablet Take 20 mg by mouth every morning.     . meloxicam (MOBIC) 15 MG tablet Take 15 mg by mouth daily.    . metoprolol succinate (TOPROL-XL) 100 MG 24 hr tablet Take 100 mg by mouth every morning.     [name]  Multiple Vitamin (MULTIVITAMIN) capsule Take 1 capsule by mouth every morning.     . Omega-3 Fatty Acids (FISH OIL) 1000 MG CAPS Take 1,000 mg by mouth daily.    . sildenafil (VIAGRA) 100 MG tablet Take 100 mg by mouth daily as needed for erectile dysfunction.     . Turmeric 450 MG CAPS Take 500 mg by mouth every morning.     . vardenafil (LEVITRA) 20 MG tablet Take 20 mg by mouth daily as needed for erectile dysfunction.     . vitamin C (ASCORBIC ACID) 500 MG tablet Take 500 mg by mouth daily.     No current facility-administered medications for this visit.    SURGICAL HISTORY:  Past Surgical  History  Procedure Laterality Date  . Cataract extraction    . Pilonidal cyst excision    . Video bronchoscopy N/A 01/30/2015    Procedure: VIDEO BRONCHOSCOPY;  Surgeon: [name], MD;  Location[facility];  Service: Thoracic;  Laterality: N/A;  . Video assisted thoracoscopy (vats)/wedge resection Right 01/30/2015    Procedure: Right VIDEO ASSISTED THORACOSCOPY, Right upper Lobectomy with lymph node dissection and ONQ insertion;  Surgeon: [name], MD;  Location: [place];  Service: Thoracic;  Laterality: Right;    REVIEW OF SYSTEMS:  A comprehensive review of systems was negative except for: Respiratory: positive for dyspnea on exertion   PHYSICAL EXAMINATION: General appearance: alert, cooperative and no distress Head: Normocephalic, without obvious abnormality, atraumatic Neck: no adenopathy, no JVD, supple, symmetrical, trachea midline and thyroid not enlarged, symmetric, no tenderness/mass/nodules Lymph nodes: Cervical, supraclavicular, and axillary nodes normal. Resp: clear to auscultation bilaterally Back: symmetric, no curvature. ROM normal. No CVA tenderness. Cardio: regular rate and rhythm, S1, S2 normal, no murmur, click, rub or gallop GI: soft, non-tender; bowel sounds normal; no masses,  no organomegaly Extremities: extremities normal, atraumatic, no cyanosis or edema Neurologic: Alert and oriented X 3, normal strength and tone. Normal symmetric reflexes. Normal coordination and gait  ECOG PERFORMANCE STATUS: 0 - Asymptomatic  Blood pressure 166/94, pulse 95, temperature 98.2 F (36.8 C), resp. rate 18, height 5' 10"  (1.778 m), weight 174 lb 4.8 oz (79.062 kg), SpO2 99 %.  LABORATORY DATA: Lab Results  Component Value Date   WBC 4.1 12/17/2015   HGB 12.4* 12/17/2015   HCT 36.3* 12/17/2015   MCV 98.6* 12/17/2015   PLT 147 12/17/2015      Chemistry      Component Value Date/Time   NA 140 12/17/2015 1010   NA 137 02/03/2015 0236   K 5.8* 12/17/2015 1010   K  4.4 02/03/2015 0236   CL 103 02/03/2015 0236   CO2 23 12/17/2015 1010   CO2 26 02/03/2015 0236   BUN 28.7* 12/17/2015 1010   BUN 21* 02/03/2015 0236   CREATININE 1.3 12/17/2015 1010   CREATININE 1.25* 02/03/2015 0236      Component Value Date/Time   CALCIUM 9.8 12/17/2015 1010   CALCIUM 8.6* 02/03/2015 0236   ALKPHOS 41 12/17/2015 1010   ALKPHOS 33* 02/01/2015 0415   AST 21 12/17/2015 1010   AST 25 02/01/2015 0415   ALT 19 12/17/2015 1010   ALT 16* 02/01/2015 0415   BILITOT 0.67 12/17/2015 1010   BILITOT 0.4 02/01/2015 0415       RADIOGRAPHIC STUDIES: Ct Chest W Contrast  12/17/2015  CLINICAL DATA:  75 year old male with history of right-sided lung cancer status post right upper lobectomy and chemotherapy (completed in February 2016). Followup study. Former  smoker, quit in 1999. EXAM: CT CHEST WITH CONTRAST TECHNIQUE: Multidetector CT imaging of the chest was performed during intravenous contrast administration. CONTRAST:  24m ISOVUE-300 IOPAMIDOL (ISOVUE-300) INJECTION 61% COMPARISON:  Chest CT 06/18/2015. FINDINGS: Mediastinum/Lymph Nodes: Heart size is normal. There is no significant pericardial fluid, thickening or pericardial calcification. There is atherosclerosis of the thoracic aorta, the great vessels of the mediastinum and the coronary arteries, including calcified atherosclerotic plaque in the left main, left anterior descending and left circumflex coronary arteries. No pathologically enlarged mediastinal or hilar lymph nodes. Esophagus is unremarkable in appearance. No axillary lymphadenopathy. Lungs/Pleura: Status post right upper lobectomy. 7 x 3 mm (mean diameter of 5 mm) subpleural nodule in the periphery of the right lower lobe (image 85 of series 5) is unchanged. 13 x 9 mm ground-glass attenuation nodular area in the superior segment of the right lower lobe (image 45 of series 5) is unchanged in retrospect compared to the prior examination. No other larger more suspicious  appearing pulmonary nodules or masses are otherwise noted. No acute consolidative airspace disease. No pleural effusions. Mild diffuse bronchial wall thickening with moderate centrilobular and paraseptal emphysema. Upper Abdomen: Several sub cm low-attenuation lesions in the liver are too small to definitively characterize, but generally appear similar to prior study from 06/18/2015, favored to represent tiny cysts. Incompletely visualized low-attenuation lesion measuring 2 cm in the interpolar region of the right kidney is unchanged, compatible with a simple cyst. Other simple cysts are also noted in the upper pole of the left kidney. 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 Musculoskeletal/Soft Tissues: Postthoracotomy changes in the right hemithorax with some bony bridging between the lateral aspects of the right sixth and seventh ribs. There are no aggressive appearing lytic or blastic lesions noted in the visualized portions of the skeleton. IMPRESSION: 1. Stable postoperative findings of prior right upper lobectomy, without evidence to suggest local recurrence of disease or definite metastatic disease in the thorax. 2. Small subpleural nodule in the periphery of the right lower lobe likely represents a subpleural lymph node and is unchanged. There is also a very subtle 1.3 x 0.9 cm ground-glass attenuation nodule in the superior segment of the right lower lobe, which is unchanged in retrospect but nonspecific. Continued attention on followup studies is recommended to ensure the stability or resolution of this finding. 3. Mild diffuse bronchial thickening with moderate centrilobular and paraseptal emphysema; imaging findings suggestive of underlying COPD. Electronically Signed   By: DVinnie LangtonM.D.   On: 12/17/2015 13:50    ASSESSMENT AND PLAN: This is a very pleasant 75years old white male recently diagnosed with a stage IB non-small cell  lung cancer, poorly differentiated adenocarcinoma diagnosed in August 2016, status post right upper lobectomy with lymph node dissection and myriad diagnostic tests showed high risk score was 25% risk of dying of lung cancer and 5 years. He underwent a course of adjuvant systemic chemotherapy with carboplatin and Alimta status post 4 cycles. He tolerated his treatment fairly well with no significant adverse effects. The recent CT scan of the chest showed no evidence for disease recurrence. I discussed the scan results with the patient and his wife. I recommended for him to continue on observation with repeat CT scan of the chest in 6 months. For the persistent anemia, this is significantly improved after the patient was started on treatment with oral iron tablet. He was advised to call immediately if he has any concerning symptoms  in the interval. The patient voices understanding of current disease status and treatment options and is in agreement with the current care plan.  All questions were answered. The patient knows to call the clinic with any problems, questions or concerns. We can certainly see the patient much sooner if necessary.  Disclaimer: This note was dictated with voice recognition software. Similar sounding words can inadvertently be transcribed and may not be corrected upon review.

## 2015-12-24 NOTE — Telephone Encounter (Signed)
Gave and printed appts ched and avs for pt for DEC  °

## 2016-03-13 ENCOUNTER — Ambulatory Visit (INDEPENDENT_AMBULATORY_CARE_PROVIDER_SITE_OTHER): Payer: Medicare Other | Admitting: Cardiothoracic Surgery

## 2016-03-13 ENCOUNTER — Encounter: Payer: Self-pay | Admitting: Cardiothoracic Surgery

## 2016-03-13 VITALS — BP 126/79 | HR 104 | Resp 18 | Ht 70.0 in | Wt 168.0 lb

## 2016-03-13 DIAGNOSIS — C3411 Malignant neoplasm of upper lobe, right bronchus or lung: Secondary | ICD-10-CM | POA: Diagnosis not present

## 2016-03-13 DIAGNOSIS — Z902 Acquired absence of lung [part of]: Secondary | ICD-10-CM

## 2016-03-13 NOTE — Progress Notes (Signed)
[address]       [place] [address]             [phone]                  Jazmine Bugge [place] Medical Record #[id number] Date of Birth: August 02, 1940  Referring JJ:HERDEY, Jama Flavors, MD Primary Cardiology: Primary Care:WILSON,FRED Mallie Mussel, MD  Chief Complaint:  Follow Up Visit 01/30/2015  OPERATIVE REPORT PREOPERATIVE DIAGNOSIS: Adenocarcinoma, right upper lobe. POSTOPERATIVE DIAGNOSIS: Adenocarcinoma, right upper lobe. SURGICAL PROCEDURE: Video bronchoscopy, right video-assisted thoracoscopy with right upper lobectomy, lymph node dissection, and placement of On-Q. SURGEON: Lanelle Bal, MD  Lung cancer, right upper lobe   Staging form: Lung, AJCC 7th Edition     Clinical stage from 01/08/2015: Stage IIB (T3(2), N0, M0) - Signed by [name], MD on 02/02/2015     Pathologic stage from 02/02/2015: Stage IB (T2a, N0, cM0) - Signed by [name], MD on 02/02/2015  Myriad Prognostic score 31  -  25% 5 year mortality  PRIOR THERAPY:  1) Status post right upper lobectomy with lymph node dissection on 01/30/2015. The Myriad lung cancer prognostic test showed high risk prognostic score of 31 and 25% risk of dying from lung cancer in 5 years. 2) Adjuvant systemic chemotherapy with carboplatin for AUC of 5 and Alimta 500 MG/M2 every 3 weeks. First dose 03/22/2015. Status post 4 cycles.  CURRENT THERAPY: Observation.   History of Present Illness:    The patient is seen in routine follow-up after RUL lobectomy as described above. He had a follow-up CT scan at the end of June 2017. He notes that he is not as active as he passed, does note some exertional shortness of breath. He's had no hemoptysis  Zubrod Score: At the time of surgery this patient's most appropriate activity status/level should be described as: _0     0    Normal activity, no symptoms _1     1    Restricted in physical strenuous activity but ambulatory, able to do out light  work _2     2    Ambulatory and capable of self care, unable to do work activities, up and about                 >50 % of waking hours                                                                                   _3     3    Only limited self care, in bed greater than 50% of waking hours _4     4    Completely disabled, no self care, confined to bed or chair _5     5    Moribund  History  Smoking Status  . Former Smoker  . Packs/day: 1.00  . Years: 39.00  . Types: Cigarettes  . Quit date: 06/30/1997  Smokeless Tobacco  . Never Used    Comment: SMOKED PIPES ALSO       Allergies  Allergen Reactions  . Amlodipine Swelling  . Benicar [Olmesartan]     Dizziness,malaise   . Nisoldipine  Swelling    Current Outpatient Prescriptions  Medication Sig Dispense Refill  . acetaminophen (TYLENOL) 500 MG tablet Take 1,000 mg by mouth every 4 (four) hours as needed for moderate pain or headache.     [name] aspirin EC 81 MG tablet Take 81 mg by mouth every morning.     . clobetasol ointment (TEMOVATE) 0.05 % Clobetasol Propionate 0.05 % External Ointment Apply to rash, and rub in well, twice a day as needed.  Quantity: 1;  Refills: 2   Wilson M.D., Jama Flavors ;  Start 24-Apr-2010 Active 30 GM Tube    . diltiazem (CARDIZEM CD) 180 MG 24 hr capsule Take 180 mg by mouth every morning.     . ferrous sulfate 325 (65 FE) MG EC tablet Take 325 mg by mouth daily.    . folic acid (FOLVITE) 1 MG tablet Take 1 tablet (1 mg total) by mouth daily. 30 tablet 2  . gabapentin (NEURONTIN) 300 MG capsule Take 300-600 mg by mouth at bedtime.     [name] lisinopril (PRINIVIL,ZESTRIL) 20 MG tablet Take 20 mg by mouth every morning.     . meloxicam (MOBIC) 15 MG tablet Take 15 mg by mouth daily.    . metoprolol succinate (TOPROL-XL) 100 MG 24 hr tablet Take 100 mg by mouth every morning.     . Multiple Vitamin (MULTIVITAMIN) capsule Take 1 capsule by mouth every morning.     . Omega-3 Fatty Acids (FISH OIL) 1000 MG CAPS Take 1,000  mg by mouth daily.    . sildenafil (VIAGRA) 100 MG tablet Take 100 mg by mouth daily as needed for erectile dysfunction.     . Turmeric 450 MG CAPS Take 500 mg by mouth every morning.     . vardenafil (LEVITRA) 20 MG tablet Take 20 mg by mouth daily as needed for erectile dysfunction.     . vitamin C (ASCORBIC ACID) 500 MG tablet Take 500 mg by mouth daily.     No current facility-administered medications for this visit.        Physical Exam: BP 126/79   Pulse (!) 104   Resp 18   Ht _0  (1.778 m)   Wt 168 lb (76.2 kg)   SpO2 97% Comment: RA  BMI 24.11 kg/m   Physical exam General : NAD Lungs: clear BS Cor: RRR, S1S2 normal, no murmur Abd: soft, nontender, nondistended Ext: Warm and well perfused Incisions: well healed, right chest Patient has no cervical or supracervical or adenopathy   Diagnostic Studies & Laboratory data:         Recent Radiology Findings: CLINICAL DATA:  75 year old male with history of right-sided lung cancer status post right upper lobectomy and chemotherapy (completed in February 2016). Followup study. Former smoker, quit in 1999.  EXAM: CT CHEST WITH CONTRAST  TECHNIQUE: Multidetector CT imaging of the chest was performed during intravenous contrast administration.  CONTRAST:  37m ISOVUE-300 IOPAMIDOL (ISOVUE-300) INJECTION 61%  COMPARISON:  Chest CT 06/18/2015.  FINDINGS: Mediastinum/Lymph Nodes: Heart size is normal. There is no significant pericardial fluid, thickening or pericardial calcification. There is atherosclerosis of the thoracic aorta, the great vessels of the mediastinum and the coronary arteries, including calcified atherosclerotic plaque in the left main, left anterior descending and left circumflex coronary arteries. No pathologically enlarged mediastinal or hilar lymph nodes. Esophagus is unremarkable in appearance. No axillary lymphadenopathy.  Lungs/Pleura: Status post right upper lobectomy. 7 x 3 mm  (mean diameter of 5 mm) subpleural nodule in the  periphery of the right lower lobe (image 85 of series 5) is unchanged. 13 x 9 mm ground-glass attenuation nodular area in the superior segment of the right lower lobe (image 45 of series 5) is unchanged in retrospect compared to the prior examination. No other larger more suspicious appearing pulmonary nodules or masses are otherwise noted. No acute consolidative airspace disease. No pleural effusions. Mild diffuse bronchial wall thickening with moderate centrilobular and paraseptal emphysema.  Upper Abdomen: Several sub cm low-attenuation lesions in the liver are too small to definitively characterize, but generally appear similar to prior study from 06/18/2015, favored to represent tiny cysts. Incompletely visualized low-attenuation lesion measuring 2 cm in the interpolar region of the right kidney is unchanged, compatible with a simple cyst. Other simple cysts are also noted in the upper pole of the left kidney. 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  Musculoskeletal/Soft Tissues: Postthoracotomy changes in the right hemithorax with some bony bridging between the lateral aspects of the right sixth and seventh ribs. There are no aggressive appearing lytic or blastic lesions noted in the visualized portions of the skeleton.  IMPRESSION: 1. Stable postoperative findings of prior right upper lobectomy, without evidence to suggest local recurrence of disease or definite metastatic disease in the thorax. 2. Small subpleural nodule in the periphery of the right lower lobe likely represents a subpleural lymph node and is unchanged. There is also a very subtle 1.3 x 0.9 cm ground-glass attenuation nodule in the superior segment of the right lower lobe, which is unchanged in retrospect but nonspecific. Continued attention on followup studies is recommended to ensure the  stability or resolution of this finding. 3. Mild diffuse bronchial thickening with moderate centrilobular and paraseptal emphysema; imaging findings suggestive of underlying COPD.   Electronically Signed   By: Vinnie Langton M.D.   On: 12/17/2015 13:50    I have independently reviewed the above radiology findings and reviewed findings  with the patient.  Recent Labs: Lab Results  Component Value Date   WBC 4.1 12/17/2015   HGB 12.4 (L) 12/17/2015   HCT 36.3 (L) 12/17/2015   PLT 147 12/17/2015   GLUCOSE 103 12/17/2015   ALT 19 12/17/2015   AST 21 12/17/2015   NA 140 12/17/2015   K 5.8 (H) 12/17/2015   CL 103 02/03/2015   CREATININE 1.3 12/17/2015   BUN 28.7 (H) 12/17/2015   CO2 23 12/17/2015   INR 0.99 01/26/2015      Assessment / Plan:   Patient stable early postoperatively taking continued improvement in physical activity. Doing well postoperatively, He has completed 4 cycles of chemotherapy. Plan to see him back in 6 months ,  ct of chest ordered by oncology in December 2017    [name] 03/13/2016 11:14 AM

## 2016-04-02 ENCOUNTER — Other Ambulatory Visit: Payer: Self-pay | Admitting: *Deleted

## 2016-04-02 DIAGNOSIS — C3410 Malignant neoplasm of upper lobe, unspecified bronchus or lung: Secondary | ICD-10-CM

## 2016-04-02 MED ORDER — FOLIC ACID 1 MG PO TABS: 1.0000 mg | ORAL_TABLET | Freq: Every day | ORAL | 2 refills | Status: DC

## 2016-04-02 NOTE — Telephone Encounter (Signed)
Rx for Folic acid sent to pt pharmacy

## 2016-06-10 ENCOUNTER — Other Ambulatory Visit (HOSPITAL_BASED_OUTPATIENT_CLINIC_OR_DEPARTMENT_OTHER): Payer: Medicare Other

## 2016-06-10 ENCOUNTER — Ambulatory Visit (HOSPITAL_COMMUNITY)
Admission: RE | Admit: 2016-06-10 | Discharge: 2016-06-10 | Disposition: A | Discharge status: Home / Self Care | Payer: Medicare Other | Source: Ambulatory Visit | Attending: Internal Medicine | Admitting: Internal Medicine

## 2016-06-10 DIAGNOSIS — Z9889 Other specified postprocedural states: Secondary | ICD-10-CM | POA: Diagnosis not present

## 2016-06-10 DIAGNOSIS — N281 Cyst of kidney, acquired: Secondary | ICD-10-CM | POA: Insufficient documentation

## 2016-06-10 DIAGNOSIS — I728 Aneurysm of other specified arteries: Secondary | ICD-10-CM | POA: Diagnosis not present

## 2016-06-10 DIAGNOSIS — Z902 Acquired absence of lung [part of]: Secondary | ICD-10-CM | POA: Diagnosis not present

## 2016-06-10 DIAGNOSIS — C3411 Malignant neoplasm of upper lobe, right bronchus or lung: Secondary | ICD-10-CM

## 2016-06-10 DIAGNOSIS — M5134 Other intervertebral disc degeneration, thoracic region: Secondary | ICD-10-CM | POA: Diagnosis not present

## 2016-06-10 DIAGNOSIS — I700 Atherosclerosis of aorta: Secondary | ICD-10-CM | POA: Diagnosis not present

## 2016-06-10 DIAGNOSIS — J432 Centrilobular emphysema: Secondary | ICD-10-CM | POA: Diagnosis not present

## 2016-06-10 DIAGNOSIS — Z85118 Personal history of other malignant neoplasm of bronchus and lung: Secondary | ICD-10-CM | POA: Diagnosis not present

## 2016-06-10 DIAGNOSIS — Z08 Encounter for follow-up examination after completed treatment for malignant neoplasm: Secondary | ICD-10-CM | POA: Diagnosis present

## 2016-06-10 LAB — CBC WITH DIFFERENTIAL/PLATELET
BASO%: 0.6 % (ref 0.0–2.0)
BASOS ABS: 0 10*3/uL (ref 0.0–0.1)
EOS ABS: 0.1 10*3/uL (ref 0.0–0.5)
EOS%: 1.2 % (ref 0.0–7.0)
HEMATOCRIT: 40 % (ref 38.4–49.9)
HEMOGLOBIN: 13.2 g/dL (ref 13.0–17.1)
LYMPH#: 1.5 10*3/uL (ref 0.9–3.3)
LYMPH%: 22.1 % (ref 14.0–49.0)
MCH: 32 pg (ref 27.2–33.4)
MCHC: 33 g/dL (ref 32.0–36.0)
MCV: 96.9 fL (ref 79.3–98.0)
MONO#: 0.9 10*3/uL (ref 0.1–0.9)
MONO%: 12.7 % (ref 0.0–14.0)
NEUT#: 4.3 10*3/uL (ref 1.5–6.5)
NEUT%: 63.4 % (ref 39.0–75.0)
PLATELETS: 199 10*3/uL (ref 140–400)
RBC: 4.13 10*6/uL — ABNORMAL LOW (ref 4.20–5.82)
RDW: 13.7 % (ref 11.0–14.6)
WBC: 6.8 10*3/uL (ref 4.0–10.3)

## 2016-06-10 LAB — COMPREHENSIVE METABOLIC PANEL
ALBUMIN: 3.6 g/dL (ref 3.5–5.0)
ALK PHOS: 54 U/L (ref 40–150)
ALT: 13 U/L (ref 0–55)
ANION GAP: 10 meq/L (ref 3–11)
AST: 20 U/L (ref 5–34)
BILIRUBIN TOTAL: 0.46 mg/dL (ref 0.20–1.20)
BUN: 17.2 mg/dL (ref 7.0–26.0)
CALCIUM: 9.9 mg/dL (ref 8.4–10.4)
CO2: 25 mEq/L (ref 22–29)
Chloride: 105 mEq/L (ref 98–109)
Creatinine: 1.1 mg/dL (ref 0.7–1.3)
EGFR: 66 mL/min/{1.73_m2} — AB (ref >90)
Glucose: 93 mg/dl (ref 70–140)
POTASSIUM: 4.9 meq/L (ref 3.5–5.1)
Sodium: 141 mEq/L (ref 136–145)
TOTAL PROTEIN: 7.3 g/dL (ref 6.4–8.3)

## 2016-06-10 MED ORDER — IOPAMIDOL (ISOVUE-300) INJECTION 61%
75.0000 mL | Freq: Once | INTRAVENOUS | Status: AC | PRN
  Administered 2016-06-10: 75 mL via INTRAVENOUS

## 2016-06-17 ENCOUNTER — Telehealth: Payer: Self-pay | Admitting: Internal Medicine

## 2016-06-17 ENCOUNTER — Ambulatory Visit (HOSPITAL_BASED_OUTPATIENT_CLINIC_OR_DEPARTMENT_OTHER): Payer: Medicare Other | Admitting: Internal Medicine

## 2016-06-17 ENCOUNTER — Encounter: Payer: Self-pay | Admitting: Internal Medicine

## 2016-06-17 ENCOUNTER — Other Ambulatory Visit: Payer: Self-pay | Admitting: Internal Medicine

## 2016-06-17 VITALS — BP 156/71 | HR 95 | Temp 98.0°F | Resp 17 | Ht 70.0 in | Wt 171.5 lb

## 2016-06-17 DIAGNOSIS — J439 Emphysema, unspecified: Secondary | ICD-10-CM | POA: Diagnosis not present

## 2016-06-17 DIAGNOSIS — C3411 Malignant neoplasm of upper lobe, right bronchus or lung: Secondary | ICD-10-CM | POA: Diagnosis not present

## 2016-06-17 DIAGNOSIS — I10 Essential (primary) hypertension: Secondary | ICD-10-CM

## 2016-06-17 MED ORDER — DOXYCYCLINE HYCLATE 100 MG PO TABS: 100.0000 mg | ORAL_TABLET | Freq: Two times a day (BID) | ORAL | 0 refills | Status: DC

## 2016-06-17 NOTE — Progress Notes (Signed)
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  PRIOR THERAPY: 1) Status post right upper lobectomy with lymph node dissection on 01/30/2015. The Myriad lung cancer prognostic test showed high risk prognostic score of 31 and 25% risk of dying from lung cancer in 5 years. 2) Adjuvant systemic chemotherapy with carboplatin for AUC of 5 and Alimta 500 MG/M2 every 3 weeks. First dose 03/22/2015. Status post 4 cycles.  CURRENT THERAPY: Observation.  INTERVAL HISTORY: Donald Delacruz 75 y.o. male returns to the clinic today for six-month follow-up visit accompanied by his wife. The patient has been complaining of chest congestion and cough for the last few weeks. He also has shortness of breath with exertion. He denied having any chest pain or hemoptysis. He has no weight loss or night sweats. The patient has no fever or chills. He has no nausea, vomiting, diarrhea or constipation. He has been observation for more than year. He had repeat CT scan of the chest performed recently and he is here for evaluation and discussion of his scan results.  MEDICAL HISTORY: Past Medical History:  Diagnosis Date  . 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 Coronary artery calcification seen on computed tomography June 2016   CT scan of chest: Coronary artery calcifications are noted  . ED (erectile dysfunction)   . Emphysema lung ([place])   . Essential hypertension   . Insomnia   . Lipoma   . Osteoarthritis   . Restless legs syndrome   . Thoracic aortic atherosclerosis [facility]) June 2016   CT scan chest:  Atherosclerosis of thoracic aorta is noted without aneurysm or dissection. Visualized portion of upper abdomen demonstrates stable calcified splenic artery and right renal    ALLERGIES:  is allergic to amlodipine; benicar [olmesartan]; and nisoldipine.  MEDICATIONS:  Current Outpatient Prescriptions  Medication Sig Dispense Refill  . acetaminophen (TYLENOL) 500 MG tablet Take 1,000 mg by mouth every 4 (four) hours as needed for moderate pain or headache.     [name] aspirin EC 81 MG tablet Take 81 mg by mouth every morning.     . clobetasol ointment (TEMOVATE) 0.05 % Clobetasol Propionate 0.05 % External Ointment Apply to rash, and rub in well, twice a day as needed.  Quantity: 1;  Refills: 2   Wilson M.D., [employer] ;  Start 24-Apr-2010 Active 30 GM Tube    . diltiazem (CARDIZEM CD) 180 MG 24 hr capsule Take 180 mg by mouth every morning.     . ferrous sulfate 325 (65 FE) MG EC tablet Take 325 mg by mouth daily.    . folic acid (FOLVITE) 1 MG tablet Take 1 tablet (1 mg total) by mouth daily. 30 tablet 2  . gabapentin (NEURONTIN) 300 MG capsule Take 300-600 mg by mouth at bedtime.     [name] lisinopril (PRINIVIL,ZESTRIL) 20 MG tablet Take 20 mg by mouth every morning.     . meloxicam (MOBIC) 15 MG tablet Take 15 mg by mouth daily.    . metoprolol succinate (TOPROL-XL) 100 MG 24 hr tablet Take 100 mg by mouth every morning.     . Multiple  Vitamin (MULTIVITAMIN) capsule Take 1 capsule by mouth every morning.     . Omega-3 Fatty Acids (FISH OIL) 1000 MG CAPS Take 1,000 mg by mouth daily.    . sildenafil (VIAGRA) 100 MG tablet Take 100 mg by mouth daily as needed for erectile dysfunction.     . Turmeric 450 MG CAPS Take 500 mg by mouth every morning.     . vardenafil (LEVITRA) 20 MG tablet Take 20 mg by mouth daily as needed for erectile dysfunction.     . vitamin C (ASCORBIC ACID) 500 MG tablet Take 500 mg by mouth daily.     No current facility-administered medications for this visit.     SURGICAL  HISTORY:  Past Surgical History:  Procedure Laterality Date  . CATARACT EXTRACTION    . PILONIDAL CYST EXCISION    . VIDEO ASSISTED THORACOSCOPY (VATS)/WEDGE RESECTION Right 01/30/2015   Procedure: Right VIDEO ASSISTED THORACOSCOPY, Right upper Lobectomy with lymph node dissection and ONQ insertion;  Surgeon: [name], MD;  Location: [place];  Service: Thoracic;  Laterality: Right;  [name] VIDEO BRONCHOSCOPY N/A 01/30/2015   Procedure: VIDEO BRONCHOSCOPY;  Surgeon: [name], MD;  Location: [facility] OR;  Service: Thoracic;  Laterality: N/A;    REVIEW OF SYSTEMS:  Constitutional: positive for fatigue Eyes: negative Ears, nose, mouth, throat, and face: negative Respiratory: positive for cough and dyspnea on exertion Cardiovascular: negative Gastrointestinal: negative Genitourinary:negative Integument/breast: negative Hematologic/lymphatic: negative Musculoskeletal:negative Neurological: negative Behavioral/Psych: negative Endocrine: negative Allergic/Immunologic: negative   PHYSICAL EXAMINATION: General appearance: alert, cooperative, fatigued and no distress Head: Normocephalic, without obvious abnormality, atraumatic Neck: no adenopathy, no JVD, supple, symmetrical, trachea midline and thyroid not enlarged, symmetric, no tenderness/mass/nodules Lymph nodes: Cervical, supraclavicular, and axillary nodes normal. Resp: wheezes bilaterally Back: symmetric, no curvature. ROM normal. No CVA tenderness. Cardio: regular rate and rhythm, S1, S2 normal, no murmur, click, rub or gallop GI: soft, non-tender; bowel sounds normal; no masses,  no organomegaly Extremities: extremities normal, atraumatic, no cyanosis or edema Neurologic: Alert and oriented X 3, normal strength and tone. Normal symmetric reflexes. Normal coordination and gait  ECOG PERFORMANCE STATUS: 1 - Symptomatic but completely ambulatory  Blood pressure (!) 156/71, pulse 95, temperature 98 F (36.7 C), temperature source  Oral, resp. rate 17, height 5' 10"  (1.778 m), weight 171 lb 8 oz (77.8 kg), SpO2 97 %.  LABORATORY DATA: Lab Results  Component Value Date   WBC 6.8 06/10/2016   HGB 13.2 06/10/2016   HCT 40.0 06/10/2016   MCV 96.9 06/10/2016   PLT 199 06/10/2016      Chemistry      Component Value Date/Time   NA 141 06/10/2016 1001   K 4.9 06/10/2016 1001   CL 103 02/03/2015 0236   CO2 25 06/10/2016 1001   BUN 17.2 06/10/2016 1001   CREATININE 1.1 06/10/2016 1001      Component Value Date/Time   CALCIUM 9.9 06/10/2016 1001   ALKPHOS 54 06/10/2016 1001   AST 20 06/10/2016 1001   ALT 13 06/10/2016 1001   BILITOT 0.46 06/10/2016 1001       RADIOGRAPHIC STUDIES: Ct Chest W Contrast  Result Date: 06/10/2016 CLINICAL DATA:  Followup lung cancer. EXAM: CT CHEST WITH CONTRAST TECHNIQUE: Multidetector CT imaging of the chest was performed during intravenous contrast administration. CONTRAST:  83m ISOVUE-300 IOPAMIDOL (ISOVUE-300) INJECTION 61% COMPARISON:  12/17/2015 FINDINGS: Cardiovascular: Heart size is normal. Aortic atherosclerosis. Calcification within the LAD coronary artery noted. Mediastinum/Nodes: The trachea appears patent and  is midline. Increased soft tissue stranding within the anterior mediastinal fat. Pre-vascular lymph node measures 1.1 cm, image 53 of series 2. New from previous exam. 8 mm right paratracheal lymph node is identified, image 53 of series 2. Also new from previous exam. 1.1 cm right paratracheal lymph node is noted, image 63 of series 2. Previously 1.0 cm. 1 cm sub- carinal lymph node is new from previous exam. Lungs/Pleura: Moderate changes of centrilobular emphysema. Postoperative changes from right upper lobectomy is again noted. Upper Abdomen: 8 mm low-attenuation structure within the lateral segment of left lobe of liver is unchanged from previous exam. 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  Cyst arising from upper pole of the left kidney is identified. Stable tiny low density foci within the posterior right lobe of liver. 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 No aggressive lytic or sclerotic bone lesions. IMPRESSION: 1. New right paratracheal and prevascular lymph nodes worrisome for new metastatic adenopathy. Consider further evaluation with PET-CT. 2. Diffuse bronchial wall thickening with emphysema, as above; imaging findings suggestive of underlying COPD. Electronically Signed   By: Kerby Moors M.D.   On: 06/10/2016 15:21    ASSESSMENT AND PLAN: This is a very pleasant 75 years old white male with: 1) questionable recurrent non-small cell lung cancer initially diagnosed as stage IB status post right upper lobectomy with lymph node dissection followed by 4 cycles of adjuvant systemic chemotherapy with cisplatin and Alimta. The patient has been observation for around one year. He had a recent CT scan of the chest for restaging of his disease. I personally and independently reviewed the scan images and discuss the results and showed the images to the patient and his wife today. Unfortunately there are some concerning findings with new right paratracheal and prevascular lymph node suspicious for disease recurrence in the chest. I recommended for the patient to have a PET scan performed for further evaluation of his disease. 2) chest congestion and bronchitis: I will start the patient on doxycycline 100 mg by mouth twice a day for 7 days in case the mediastinal lymphadenopathy are secondary to inflammatory process. 3) hypertension: The patient was advised to continue on his current blood pressure medication with metoprolol, lisinopril and Cardizem. He would come back for follow-up visit in 3 weeks. He was advised to call immediately if he has any concerning symptoms in the interval.  The patient voices understanding of current  disease status and treatment options and is in agreement with the current care plan.  All questions were answered. The patient knows to call the clinic with any problems, questions or concerns. We can certainly see the patient much sooner if necessary.  Disclaimer: This note was dictated with voice recognition software. Similar sounding words can inadvertently be transcribed and may not be corrected upon review.

## 2016-06-17 NOTE — Telephone Encounter (Signed)
Appointments scheduled per 06/17/16 los. A copy of the appointment schedule and AVS report was given to the patient, per 06/17/16 los.

## 2016-06-27 ENCOUNTER — Other Ambulatory Visit: Payer: Self-pay | Admitting: Nurse Practitioner

## 2016-07-03 ENCOUNTER — Encounter: Payer: Self-pay | Admitting: *Deleted

## 2016-07-03 ENCOUNTER — Ambulatory Visit (HOSPITAL_COMMUNITY)
Admission: RE | Admit: 2016-07-03 | Discharge: 2016-07-03 | Disposition: A | Discharge status: Home / Self Care | Payer: Medicare Other | Source: Ambulatory Visit | Attending: Internal Medicine | Admitting: Internal Medicine

## 2016-07-03 ENCOUNTER — Encounter: Payer: Self-pay | Admitting: Internal Medicine

## 2016-07-03 ENCOUNTER — Ambulatory Visit (HOSPITAL_BASED_OUTPATIENT_CLINIC_OR_DEPARTMENT_OTHER): Payer: Medicare Other | Admitting: Internal Medicine

## 2016-07-03 ENCOUNTER — Telehealth: Payer: Self-pay | Admitting: Internal Medicine

## 2016-07-03 VITALS — BP 145/69 | HR 94 | Temp 97.8°F | Resp 18 | Ht 70.0 in | Wt 170.5 lb

## 2016-07-03 DIAGNOSIS — J439 Emphysema, unspecified: Secondary | ICD-10-CM

## 2016-07-03 DIAGNOSIS — I10 Essential (primary) hypertension: Secondary | ICD-10-CM

## 2016-07-03 DIAGNOSIS — R05 Cough: Secondary | ICD-10-CM | POA: Diagnosis not present

## 2016-07-03 DIAGNOSIS — Z902 Acquired absence of lung [part of]: Secondary | ICD-10-CM | POA: Insufficient documentation

## 2016-07-03 DIAGNOSIS — C3411 Malignant neoplasm of upper lobe, right bronchus or lung: Secondary | ICD-10-CM | POA: Diagnosis present

## 2016-07-03 LAB — GLUCOSE, CAPILLARY: Glucose-Capillary: 121 mg/dL — ABNORMAL HIGH (ref 65–99)

## 2016-07-03 MED ORDER — HYDROCODONE-HOMATROPINE 5-1.5 MG/5ML PO SYRP: 5.0000 mL | ORAL_SOLUTION | Freq: Four times a day (QID) | ORAL | 0 refills | Status: DC | PRN

## 2016-07-03 MED ORDER — FLUDEOXYGLUCOSE F - 18 (FDG) INJECTION: 8.4000 | Freq: Once | INTRAVENOUS | Status: DC | PRN

## 2016-07-03 NOTE — Progress Notes (Signed)
[place] Telephone:(336) [phone]   Fax:(336) [place] NOTE  [name], MD [address] [place] [address]  DIAGNOSIS: Highly suspicious for recurrent non-small cell lung cancer initially diagnosed as stage IB (T2a, N0, M0) moderately to poorly differentiated adenocarcinoma involving the right upper lobe diagnosed in August 2016.  PRIOR THERAPY: 1) Status post right upper lobectomy with lymph node dissection on 01/30/2015. The Myriad lung cancer prognostic test showed high risk prognostic score of 31 and 25% risk of dying from lung cancer in 5 years. 2) Adjuvant systemic chemotherapy with carboplatin for AUC of 5 and Alimta 500 MG/M2 every 3 weeks. First dose 03/22/2015. Status post 4 cycles.  CURRENT THERAPY: None.  INTERVAL HISTORY: Donald Delacruz 76 y.o. male returns to the clinic today for follow-up visit accompanied by his wife. The patient has several complaints today including chest tightness as well as persistent dry cough. He finished a course of doxycycline with no improvement in his condition. He'll also use over-the-counter TheraFlu which does help some. He lost few pounds since his last visit. He continues to have shortness breath with exertion but no hemoptysis. He has no fever or chills. He denied having any nausea, vomiting, diarrhea or constipation. The patient was found on previous CT scan of the chest to have suspicious disease recurrence in the chest. I ordered a PET scan and the patient is here today for evaluation and discussion of his condition and treatment options.  MEDICAL HISTORY: Past Medical History:  Diagnosis Date  . 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 Coronary artery calcification seen on computed tomography June 2016   CT scan of chest: Coronary artery calcifications are noted  . ED (erectile dysfunction)   . Emphysema lung ([place])    . Essential hypertension   . Insomnia   . Lipoma   . Osteoarthritis   . Restless legs syndrome   . Thoracic aortic atherosclerosis [facility]) June 2016   CT scan chest: Atherosclerosis of thoracic aorta is noted without aneurysm or dissection. Visualized portion of upper abdomen demonstrates stable calcified splenic artery and right renal    ALLERGIES:  is allergic to amlodipine; benicar [olmesartan]; and nisoldipine.  MEDICATIONS:  Current Outpatient Prescriptions  Medication Sig Dispense Refill  . acetaminophen (TYLENOL) 500 MG tablet Take 1,000 mg by mouth every 4 (four) hours as needed for moderate pain or headache.     [name] aspirin EC 81 MG tablet Take 81 mg by mouth every morning.     . clobetasol ointment (TEMOVATE) 0.05 % Clobetasol Propionate 0.05 % External Ointment Apply to rash, and rub in well, twice a day as needed.  Quantity: 1;  Refills: 2   Wilson M.D., [employer] ;  Start 24-Apr-2010 Active 30 GM Tube    . diltiazem (CARDIZEM CD) 180 MG 24 hr capsule Take 180 mg by mouth every morning.     . ferrous sulfate 325 (65 FE) MG EC tablet Take 325 mg by mouth daily.    . folic acid (FOLVITE) 1 MG tablet Take 1 tablet (1 mg total) by mouth daily. 30 tablet 2  . gabapentin (NEURONTIN) 300 MG capsule Take 300-600 mg by mouth at bedtime.     [name] lisinopril (PRINIVIL,ZESTRIL) 20 MG tablet Take 20 mg by mouth every morning.     . meloxicam (MOBIC) 15 MG tablet Take 15 mg by  mouth daily.    . metoprolol succinate (TOPROL-XL) 100 MG 24 hr tablet Take 100 mg by mouth every morning.     . Multiple Vitamin (MULTIVITAMIN) capsule Take 1 capsule by mouth every morning.     . Omega-3 Fatty Acids (FISH OIL) 1000 MG CAPS Take 1,000 mg by mouth daily.    . Turmeric 450 MG CAPS Take 500 mg by mouth every morning.     . vardenafil (LEVITRA) 20 MG tablet Take 20 mg by mouth daily as needed for erectile dysfunction.     . vitamin C (ASCORBIC ACID) 500 MG tablet Take 500 mg by mouth daily.    . sildenafil  (VIAGRA) 100 MG tablet Take 100 mg by mouth daily as needed for erectile dysfunction.      No current facility-administered medications for this visit.    Facility-Administered Medications Ordered in Other Visits  Medication Dose Route Frequency Provider Last Rate Last Dose  . fludeoxyglucose F - 18 (FDG) injection 8.4 millicurie  8.4 millicurie Intravenous Once PRN Barnet Glasgow, MD        SURGICAL HISTORY:  Past Surgical History:  Procedure Laterality Date  . CATARACT EXTRACTION    . PILONIDAL CYST EXCISION    . VIDEO ASSISTED THORACOSCOPY (VATS)/WEDGE RESECTION Right 01/30/2015   Procedure: Right VIDEO ASSISTED THORACOSCOPY, Right upper Lobectomy with lymph node dissection and ONQ insertion;  Surgeon: [name], MD;  Location: [place];  Service: Thoracic;  Laterality: Right;  [name] VIDEO BRONCHOSCOPY N/A 01/30/2015   Procedure: VIDEO BRONCHOSCOPY;  Surgeon: [name], MD;  Location: [facility] OR;  Service: Thoracic;  Laterality: N/A;    REVIEW OF SYSTEMS:  Constitutional: positive for fatigue and weight loss Eyes: negative Ears, nose, mouth, throat, and face: negative Respiratory: positive for cough, dyspnea on exertion and pleurisy/chest pain Cardiovascular: negative Gastrointestinal: negative Genitourinary:negative Integument/breast: negative Hematologic/lymphatic: negative Musculoskeletal:negative Neurological: negative Behavioral/Psych: negative Endocrine: negative Allergic/Immunologic: negative   PHYSICAL EXAMINATION: General appearance: alert, cooperative, fatigued and no distress Head: Normocephalic, without obvious abnormality, atraumatic Neck: no adenopathy, no JVD, supple, symmetrical, trachea midline and thyroid not enlarged, symmetric, no tenderness/mass/nodules Lymph nodes: Cervical, supraclavicular, and axillary nodes normal. Resp: wheezes bilaterally Back: symmetric, no curvature. ROM normal. No CVA tenderness. Cardio: regular rate and rhythm, S1, S2 normal, no  murmur, click, rub or gallop and normal apical impulse GI: soft, non-tender; bowel sounds normal; no masses,  no organomegaly Extremities: extremities normal, atraumatic, no cyanosis or edema Neurologic: Alert and oriented X 3, normal strength and tone. Normal symmetric reflexes. Normal coordination and gait  ECOG PERFORMANCE STATUS: 1 - Symptomatic but completely ambulatory  Blood pressure (!) 145/69, pulse 94, temperature 97.8 F (36.6 C), temperature source Oral, resp. rate 18, height _0  (1.778 m), weight 170 lb 8 oz (77.3 kg), SpO2 97 %.  LABORATORY DATA: Lab Results  Component Value Date   WBC 6.8 06/10/2016   HGB 13.2 06/10/2016   HCT 40.0 06/10/2016   MCV 96.9 06/10/2016   PLT 199 06/10/2016      Chemistry      Component Value Date/Time   NA 141 06/10/2016 1001   K 4.9 06/10/2016 1001   CL 103 02/03/2015 0236   CO2 25 06/10/2016 1001   BUN 17.2 06/10/2016 1001   CREATININE 1.1 06/10/2016 1001      Component Value Date/Time   CALCIUM 9.9 06/10/2016 1001   ALKPHOS 54 06/10/2016 1001   AST 20 06/10/2016 1001   ALT 13 06/10/2016 1001  BILITOT 0.46 06/10/2016 1001       RADIOGRAPHIC STUDIES: Ct Chest W Contrast  Result Date: 06/10/2016 CLINICAL DATA:  Followup lung cancer. EXAM: CT CHEST WITH CONTRAST TECHNIQUE: Multidetector CT imaging of the chest was performed during intravenous contrast administration. CONTRAST:  25m ISOVUE-300 IOPAMIDOL (ISOVUE-300) INJECTION 61% COMPARISON:  12/17/2015 FINDINGS: Cardiovascular: Heart size is normal. Aortic atherosclerosis. Calcification within the LAD coronary artery noted. Mediastinum/Nodes: The trachea appears patent and is midline. Increased soft tissue stranding within the anterior mediastinal fat. Pre-vascular lymph node measures 1.1 cm, image 53 of series 2. New from previous exam. 8 mm right paratracheal lymph node is identified, image 53 of series 2. Also new from previous exam. 1.1 cm right paratracheal lymph node is  noted, image 63 of series 2. Previously 1.0 cm. 1 cm sub- carinal lymph node is new from previous exam. Lungs/Pleura: Moderate changes of centrilobular emphysema. Postoperative changes from right upper lobectomy is again noted. Upper Abdomen: 8 mm low-attenuation structure within the lateral segment of left lobe of liver is unchanged from previous exam. 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 Cyst arising from upper pole of the left kidney is identified. Stable tiny low density foci within the posterior right lobe of liver. 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 No aggressive lytic or sclerotic bone lesions. IMPRESSION: 1. New right paratracheal and prevascular lymph nodes worrisome for new metastatic adenopathy. Consider further evaluation with PET-CT. 2. Diffuse bronchial wall thickening with emphysema, as above; imaging findings suggestive of underlying COPD. Electronically Signed   By: TKerby MoorsM.D.   On: 06/10/2016 15:21   Nm Pet Image Restag (ps) Skull Base To Thigh  Result Date: 07/03/2016 CLINICAL DATA:  Subsequent treatment strategy for lung cancer. EXAM: NUCLEAR MEDICINE PET SKULL BASE TO THIGH TECHNIQUE: 8.4 mCi F-18 FDG was injected intravenously. Full-ring PET imaging was performed from the skull base to thigh after the radiotracer. CT data was obtained and used for attenuation correction and anatomic localization. FASTING BLOOD GLUCOSE:  Value: 121 mg/dl COMPARISON:  12/06/2014 FINDINGS: NECK Hypermetabolic right supraclavicular lymph node is new from previous exam. This measures 8 mm and has an SUV max equal to 6.73, image 50 of series 4. CHEST New hypermetabolic bilateral mediastinal and right hilar lymph nodes identified. Index left pre-vascular lymph node measures 8 mm and has an SUV max equal to 7.73, image 60 of series 4. Hypermetabolic right paratracheal  lymph node measures 1.3 cm and has an SUV max equal to 6.9, image 68 of series 4. Hypermetabolic sub- carinal lymph node measures 1.6 cm and has an SUV max equal to 11.27. Posterior mediastinal lymph node between to the left mainstem bronchus and esophagus measures 1.1 cm and has an SUV max equal to 8.01, image 83 of series 4. Hypermetabolic right hilar node measures 1.8 cm and has an SUV max equal to 8.6, image 78 of series 4. Status post right upper lobectomy. No suspicious pulmonary nodules on the CT scan. ABDOMEN/PELVIS No abnormal hypermetabolic activity within the liver, pancreas, adrenal glands, or spleen. No hypermetabolic lymph nodes in the abdomen or pelvis. SKELETON Subtle focus of increased uptake within the spinous process of T1 has an SUV max equal to 3.87. Additional subtle focus of mild increased uptake within the right iliac bone has an SUV max equal to 3.89. Within the intertrochanteric region of the proximal right femur there is  a subtle focus of increased uptake within SUV max equal to 3.44. IMPRESSION: 1. Examination is positive for recurrent hypermetabolic tumor. There are new hypermetabolic right supraclavicular, bilateral mediastinal and right hilar lymph nodes compatible with metastatic adenopathy. 2. Several new foci of subtle increased uptake within the axial and appendicular skeleton worrisome for bone metastases. Electronically Signed   By: Kerby Moors M.D.   On: 07/03/2016 09:16    ASSESSMENT AND PLAN: This is a very pleasant 76 years old white male with highly suspicious recurrent non-small cell lung cancer that was initially diagnosed as a stage IB in August 2016 is status post right upper lobectomy followed by adjuvant systemic chemotherapy. The patient had a recent PET scan for evaluation of his disease. I personally and independently reviewed the PET scan images and discuss the results and showed the images to the patient and his wife. Unfortunately the PET scan is highly  suspicious for recurrent disease with new hypermetabolic right supraclavicular, bilateral mediastinal and right hilar lymph nodes as well as several new foci of metastatic bone disease. I had a lengthy discussion with the patient his wife about his condition. I recommended for him to complete the staging workup by ordering a MRI of the brain to rule out brain metastasis. I also referred the patient to Dr. Servando Snare for consideration of tissue diagnosis either with repeat bronchoscopy and endobronchial ultrasound and biopsy versus excisional biopsy of right supraclavicular lymph node if palpable. I will arrange for the patient to come back for follow-up visit in 2 weeks for evaluation and discussion of his treatment options based on the final pathology and staging workup. For cough, I gave the patient prescription for Hycodan 5 ML by mouth every 6 hours as needed. For hypertension, he will continue his current treatment with lisinopril and Cardizem. The patient was advised to call immediately if he has any concerning symptoms in the interval. The patient voices understanding of current disease status and treatment options and is in agreement with the current care plan.  All questions were answered. The patient knows to call the clinic with any problems, questions or concerns. We can certainly see the patient much sooner if necessary.  Disclaimer: This note was dictated with voice recognition software. Similar sounding words can inadvertently be transcribed and may not be corrected upon review.

## 2016-07-03 NOTE — Progress Notes (Signed)
Oncology Nurse Navigator Documentation  Oncology Nurse Navigator Flowsheets 07/03/2016  Navigator Location [facility]  Patient Visit Type MedOnc/per Dr. Worthy Flank request, I contacted Dr. Everrett [place] office regarding needs to be seen.   Treatment Phase Follow-up  Barriers/Navigation Needs Coordination of Care  Interventions Coordination of Care  Coordination of Care Appts  Acuity Level 2  Acuity Level 2 Assistance expediting appointments  Time Spent with Patient 15

## 2016-07-03 NOTE — Telephone Encounter (Signed)
Appointments scheduled per 07/03/16 los. Patient was given a copy of the AVS report and appointment schedule per 07/03/16 los.

## 2016-07-07 ENCOUNTER — Ambulatory Visit (INDEPENDENT_AMBULATORY_CARE_PROVIDER_SITE_OTHER): Payer: Medicare Other | Admitting: Cardiothoracic Surgery

## 2016-07-07 ENCOUNTER — Other Ambulatory Visit: Payer: Self-pay | Admitting: *Deleted

## 2016-07-07 ENCOUNTER — Encounter: Payer: Self-pay | Admitting: Cardiothoracic Surgery

## 2016-07-07 ENCOUNTER — Encounter (HOSPITAL_COMMUNITY): Payer: Self-pay | Admitting: *Deleted

## 2016-07-07 ENCOUNTER — Other Ambulatory Visit: Payer: Self-pay | Admitting: Cardiothoracic Surgery

## 2016-07-07 VITALS — BP 128/75 | Resp 16 | Ht 70.0 in | Wt 170.5 lb

## 2016-07-07 DIAGNOSIS — C3490 Malignant neoplasm of unspecified part of unspecified bronchus or lung: Secondary | ICD-10-CM

## 2016-07-07 DIAGNOSIS — C3411 Malignant neoplasm of upper lobe, right bronchus or lung: Secondary | ICD-10-CM | POA: Diagnosis not present

## 2016-07-07 DIAGNOSIS — Z902 Acquired absence of lung [part of]: Secondary | ICD-10-CM | POA: Diagnosis not present

## 2016-07-07 DIAGNOSIS — C7931 Secondary malignant neoplasm of brain: Secondary | ICD-10-CM

## 2016-07-07 NOTE — Patient Instructions (Signed)
Nothing by mouth after midnight except   Metoprolol in am with small sip of water

## 2016-07-07 NOTE — Progress Notes (Signed)
Spoke with pt for pre-op call. Pt denies cardiac history or recent chest pain. Has had a cough and some sob recently. Low grade fever this past weekend.

## 2016-07-07 NOTE — Progress Notes (Signed)
[address]       [place] [address]             [phone]                  Riddik Croucher [place] Medical Record #[id number] Date of Birth: 03-30-1941  Referring PP:[id number], Julien Nordmann, MD Primary Cardiology: Primary Care:WILSON,FRED Mallie Mussel, MD  Chief Complaint:  Follow Up Visit 01/30/2015  OPERATIVE REPORT PREOPERATIVE DIAGNOSIS: Adenocarcinoma, right upper lobe. POSTOPERATIVE DIAGNOSIS: Adenocarcinoma, right upper lobe. SURGICAL PROCEDURE: Video bronchoscopy, right video-assisted thoracoscopy with right upper lobectomy, lymph node dissection, and placement of On-Q. SURGEON: Lanelle Bal, MD  Lung cancer, right upper lobe   Staging form: Lung, AJCC 7th Edition     Clinical stage from 01/08/2015: Stage IIB (T3(2), N0, M0) - Signed by [name], MD on 02/02/2015     Pathologic stage from 02/02/2015: Stage IB (T2a, N0, cM0) - Signed by [name], MD on 02/02/2015  Myriad Prognostic score 31  -  25% 5 year mortality  PRIOR THERAPY:  1) Status post right upper lobectomy with lymph node dissection on 01/30/2015. The Myriad lung cancer prognostic test showed high risk prognostic score of 31 and 25% risk of dying from lung cancer in 5 years. 2) Adjuvant systemic chemotherapy with carboplatin for AUC of 5 and Alimta 500 MG/M2 every 3 weeks. First dose 03/22/2015. Status post 4 cycles.  CURRENT THERAPY: Observation.   History of Present Illness:    The patient is seen in routine follow-up after RUL lobectomy as described above. He had a follow-up CT scan at the end of June 2017. Repeat CT scan in December 2017 suggested increasing mediastinal adenopathy that was not present previously. A follow-up PET scan was done several days ago.   He notes that he is not as active as he passed, does note some exertional shortness of breath. He's had no hemoptysis He notes he has had intermittent headaches particular behind his right eye over the past  month  Zubrod Score: At the time of surgery this patient's most appropriate activity status/level should be described as: []     0    Normal activity, no symptoms [x]     1    Restricted in physical strenuous activity but ambulatory, able to do out light work []     2    Ambulatory and capable of self care, unable to do work activities, up and about                 >50 % of waking hours                                                                                   []     3    Only limited self care, in bed greater than 50% of waking hours []     4    Completely disabled, no self care, confined to bed or chair []     5    Moribund  History  Smoking Status  . Former Smoker  . Packs/day: 1.00  . Years: 39.00  . Types: Cigarettes  . Quit date: 06/30/1997  Smokeless Tobacco  . Never Used    Comment: SMOKED PIPES ALSO       Allergies  Allergen Reactions  . Amlodipine Swelling  . Benicar [Olmesartan]     Dizziness,malaise   . Nisoldipine Swelling    Current Outpatient Prescriptions  Medication Sig Dispense Refill  . acetaminophen (TYLENOL) 500 MG tablet Take 1,000 mg by mouth every 4 (four) hours as needed for moderate pain or headache.     [name] aspirin EC 81 MG tablet Take 81 mg by mouth every morning.     . clobetasol ointment (TEMOVATE) 0.05 % Clobetasol Propionate 0.05 % External Ointment Apply to rash, and rub in well, twice a day as needed.  Quantity: 1;  Refills: 2   Wilson M.D., [employer] ;  Start 24-Apr-2010 Active 30 GM Tube    . diltiazem (CARDIZEM CD) 180 MG 24 hr capsule Take 180 mg by mouth every morning.     . ferrous sulfate 325 (65 FE) MG EC tablet Take 325 mg by mouth daily.    . folic acid (FOLVITE) 1 MG tablet Take 1 tablet (1 mg total) by mouth daily. 30 tablet 2  . gabapentin (NEURONTIN) 300 MG capsule Take 300-600 mg by mouth at bedtime.     [name] HYDROcodone-homatropine (HYCODAN) 5-1.5 MG/5ML syrup Take 5 mLs by mouth every 6 (six) hours as needed for cough. 120 mL 0  .  lisinopril (PRINIVIL,ZESTRIL) 20 MG tablet Take 20 mg by mouth every morning.     . meloxicam (MOBIC) 15 MG tablet Take 15 mg by mouth daily.    . metoprolol succinate (TOPROL-XL) 100 MG 24 hr tablet Take 100 mg by mouth every morning.     . Multiple Vitamin (MULTIVITAMIN) capsule Take 1 capsule by mouth every morning.     . Omega-3 Fatty Acids (FISH OIL) 1000 MG CAPS Take 1,000 mg by mouth daily.    . sildenafil (VIAGRA) 100 MG tablet Take 100 mg by mouth daily as needed for erectile dysfunction.     . Turmeric 450 MG CAPS Take 500 mg by mouth every morning.     . vardenafil (LEVITRA) 20 MG tablet Take 20 mg by mouth daily as needed for erectile dysfunction.     . vitamin C (ASCORBIC ACID) 500 MG tablet Take 500 mg by mouth daily.     No current facility-administered medications for this visit.    Facility-Administered Medications Ordered in Other Visits  Medication Dose Route Frequency Provider Last Rate Last Dose  . fludeoxyglucose F - 18 (FDG) injection 8.4 millicurie  8.4 millicurie Intravenous Once PRN Barnet Glasgow, MD           Physical Exam: BP 128/75 (BP Location: Left Arm, Patient Position: Sitting, Cuff Size: Large)   Resp 16   Ht 5' 10"  (1.778 m)   Wt 170 lb 8 oz (77.3 kg)   SpO2 95% Comment: ON RA  BMI 24.46 kg/m   Physical exam General : NAD Lungs: clear BS Cor: RRR, S1S2 normal, no murmur Abd: soft, nontender, nondistended Ext: Warm and well perfused Incisions: well healed, right chest Patient has no cervical or supracervical or adenopathy In spite of the known slightly hypermetabolic activity right supraclavicular area do not appreciate any palpable node that could be biopsied.  Diagnostic Studies & Laboratory data:         Recent Radiology Findings: Ct Chest W Contrast  Result Date: 06/10/2016 CLINICAL DATA:  Followup lung cancer. EXAM: CT CHEST WITH CONTRAST TECHNIQUE:  Multidetector CT imaging of the chest was performed during intravenous contrast  administration. CONTRAST:  55m ISOVUE-300 IOPAMIDOL (ISOVUE-300) INJECTION 61% COMPARISON:  12/17/2015 FINDINGS: Cardiovascular: Heart size is normal. Aortic atherosclerosis. Calcification within the LAD coronary artery noted. Mediastinum/Nodes: The trachea appears patent and is midline. Increased soft tissue stranding within the anterior mediastinal fat. Pre-vascular lymph node measures 1.1 cm, image 53 of series 2. New from previous exam. 8 mm right paratracheal lymph node is identified, image 53 of series 2. Also new from previous exam. 1.1 cm right paratracheal lymph node is noted, image 63 of series 2. Previously 1.0 cm. 1 cm sub- carinal lymph node is new from previous exam. Lungs/Pleura: Moderate changes of centrilobular emphysema. Postoperative changes from right upper lobectomy is again noted. Upper Abdomen: 8 mm low-attenuation structure within the lateral segment of left lobe of liver is unchanged from previous exam. 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 Cyst arising from upper pole of the left kidney is identified. Stable tiny low density foci within the posterior right lobe of liver. 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 No aggressive lytic or sclerotic bone lesions. IMPRESSION: 1. New right paratracheal and prevascular lymph nodes worrisome for new metastatic adenopathy. Consider further evaluation with PET-CT. 2. Diffuse bronchial wall thickening with emphysema, as above; imaging findings suggestive of underlying COPD. Electronically Signed   By: TKerby MoorsM.D.   On: 06/10/2016 15:21   Nm Pet Image Restag (ps) Skull Base To Thigh  Result Date: 07/03/2016 CLINICAL DATA:  Subsequent treatment strategy for lung cancer. EXAM: NUCLEAR MEDICINE PET SKULL BASE TO THIGH TECHNIQUE: 8.4 mCi F-18 FDG was injected intravenously. Full-ring PET imaging was performed from the  skull base to thigh after the radiotracer. CT data was obtained and used for attenuation correction and anatomic localization. FASTING BLOOD GLUCOSE:  Value: 121 mg/dl COMPARISON:  12/06/2014 FINDINGS: NECK Hypermetabolic right supraclavicular lymph node is new from previous exam. This measures 8 mm and has an SUV max equal to 6.73, image 50 of series 4. CHEST New hypermetabolic bilateral mediastinal and right hilar lymph nodes identified. Index left pre-vascular lymph node measures 8 mm and has an SUV max equal to 7.73, image 60 of series 4. Hypermetabolic right paratracheal lymph node measures 1.3 cm and has an SUV max equal to 6.9, image 68 of series 4. Hypermetabolic sub- carinal lymph node measures 1.6 cm and has an SUV max equal to 11.27. Posterior mediastinal lymph node between to the left mainstem bronchus and esophagus measures 1.1 cm and has an SUV max equal to 8.01, image 83 of series 4. Hypermetabolic right hilar node measures 1.8 cm and has an SUV max equal to 8.6, image 78 of series 4. Status post right upper lobectomy. No suspicious pulmonary nodules on the CT scan. ABDOMEN/PELVIS No abnormal hypermetabolic activity within the liver, pancreas, adrenal glands, or spleen. No hypermetabolic lymph nodes in the abdomen or pelvis. SKELETON Subtle focus of increased uptake within the spinous process of T1 has an SUV max equal to 3.87. Additional subtle focus of mild increased uptake within the right iliac bone has an SUV max equal to 3.89. Within the intertrochanteric region of the proximal right femur there is a subtle focus of increased uptake within SUV max equal to 3.44. IMPRESSION: 1. Examination is positive for recurrent hypermetabolic tumor. There are new hypermetabolic right supraclavicular, bilateral mediastinal and right hilar  lymph nodes compatible with metastatic adenopathy. 2. Several new foci of subtle increased uptake within the axial and appendicular skeleton worrisome for bone metastases.  Electronically Signed   By: Kerby Moors M.D.   On: 07/03/2016 09:16   CLINICAL DATA:  76 year old male with history of right-sided lung cancer status post right upper lobectomy and chemotherapy (completed in February 2016). Followup study. Former smoker, quit in 1999.  EXAM: CT CHEST WITH CONTRAST  TECHNIQUE: Multidetector CT imaging of the chest was performed during intravenous contrast administration.  CONTRAST:  21m ISOVUE-300 IOPAMIDOL (ISOVUE-300) INJECTION 61%  COMPARISON:  Chest CT 06/18/2015.  FINDINGS: Mediastinum/Lymph Nodes: Heart size is normal. There is no significant pericardial fluid, thickening or pericardial calcification. There is atherosclerosis of the thoracic aorta, the great vessels of the mediastinum and the coronary arteries, including calcified atherosclerotic plaque in the left main, left anterior descending and left circumflex coronary arteries. No pathologically enlarged mediastinal or hilar lymph nodes. Esophagus is unremarkable in appearance. No axillary lymphadenopathy.  Lungs/Pleura: Status post right upper lobectomy. 7 x 3 mm (mean diameter of 5 mm) subpleural nodule in the periphery of the right lower lobe (image 85 of series 5) is unchanged. 13 x 9 mm ground-glass attenuation nodular area in the superior segment of the right lower lobe (image 45 of series 5) is unchanged in retrospect compared to the prior examination. No other larger more suspicious appearing pulmonary nodules or masses are otherwise noted. No acute consolidative airspace disease. No pleural effusions. Mild diffuse bronchial wall thickening with moderate centrilobular and paraseptal emphysema.  Upper Abdomen: Several sub cm low-attenuation lesions in the liver are too small to definitively characterize, but generally appear similar to prior study from 06/18/2015, favored to represent tiny cysts. Incompletely visualized low-attenuation lesion measuring 2 cm in  the interpolar region of the right kidney is unchanged, compatible with a simple cyst. Other simple cysts are also noted in the upper pole of the left kidney. 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  Musculoskeletal/Soft Tissues: Postthoracotomy changes in the right hemithorax with some bony bridging between the lateral aspects of the right sixth and seventh ribs. There are no aggressive appearing lytic or blastic lesions noted in the visualized portions of the skeleton.  IMPRESSION: 1. Stable postoperative findings of prior right upper lobectomy, without evidence to suggest local recurrence of disease or definite metastatic disease in the thorax. 2. Small subpleural nodule in the periphery of the right lower lobe likely represents a subpleural lymph node and is unchanged. There is also a very subtle 1.3 x 0.9 cm ground-glass attenuation nodule in the superior segment of the right lower lobe, which is unchanged in retrospect but nonspecific. Continued attention on followup studies is recommended to ensure the stability or resolution of this finding. 3. Mild diffuse bronchial thickening with moderate centrilobular and paraseptal emphysema; imaging findings suggestive of underlying COPD.   Electronically Signed   By: DVinnie LangtonM.D.   On: 12/17/2015 13:50    I have independently reviewed the above radiology findings and reviewed findings  with the patient.  Recent Labs: Lab Results  Component Value Date   WBC 6.8 06/10/2016   HGB 13.2 06/10/2016   HCT 40.0 06/10/2016   PLT 199 06/10/2016   GLUCOSE 93 06/10/2016   ALT 13 06/10/2016   AST 20 06/10/2016   NA 141 06/10/2016   K 4.9 06/10/2016   CL 103 02/03/2015   CREATININE 1.1 06/10/2016   BUN  17.2 06/10/2016   CO2 25 06/10/2016   INR 0.99 01/26/2015      Assessment / Plan:   Patient now has CT and PET scan evidence of recurrent disease  involving the mediastinum, possible bony involvement right femur though the patient has no symptoms in this area. I recommended to him that we proceed with bronchoscopy ebus with biopsy possible mediastinoscopy to obtain a confirmatory tissue diagnosis. Risks and options were discussed with the patient in detail and he is willing to proceed. He will discuss with family members to make arrangements for transportation is with his wife's dementia she is unable to drive.  We'll also arrange for a MRI of the brain.  I reviewed the findings with the patient and his wife including reviewing the films with them. I spent over 25 minutes explaining the likely diagnosis of recurrent lung cancer.    [name] 07/07/2016 5:03 PM

## 2016-07-08 ENCOUNTER — Encounter (HOSPITAL_COMMUNITY): Payer: Self-pay | Admitting: *Deleted

## 2016-07-08 ENCOUNTER — Ambulatory Visit (HOSPITAL_COMMUNITY)
Admission: RE | Admit: 2016-07-08 | Discharge: 2016-07-08 | Disposition: A | Discharge status: Home / Self Care | Payer: Medicare Other | Source: Ambulatory Visit | Attending: Cardiothoracic Surgery | Admitting: Cardiothoracic Surgery

## 2016-07-08 ENCOUNTER — Ambulatory Visit (HOSPITAL_COMMUNITY): Payer: Medicare Other | Admitting: Anesthesiology

## 2016-07-08 ENCOUNTER — Ambulatory Visit (HOSPITAL_COMMUNITY): Payer: Medicare Other

## 2016-07-08 ENCOUNTER — Encounter (HOSPITAL_COMMUNITY)
Admission: RE | Disposition: A | Discharge status: Home / Self Care | Payer: Self-pay | Source: Ambulatory Visit | Attending: Cardiothoracic Surgery

## 2016-07-08 DIAGNOSIS — Z902 Acquired absence of lung [part of]: Secondary | ICD-10-CM | POA: Diagnosis not present

## 2016-07-08 DIAGNOSIS — Z7982 Long term (current) use of aspirin: Secondary | ICD-10-CM | POA: Diagnosis not present

## 2016-07-08 DIAGNOSIS — Z85118 Personal history of other malignant neoplasm of bronchus and lung: Secondary | ICD-10-CM | POA: Insufficient documentation

## 2016-07-08 DIAGNOSIS — Z79899 Other long term (current) drug therapy: Secondary | ICD-10-CM | POA: Diagnosis not present

## 2016-07-08 DIAGNOSIS — J449 Chronic obstructive pulmonary disease, unspecified: Secondary | ICD-10-CM | POA: Diagnosis not present

## 2016-07-08 DIAGNOSIS — Z791 Long term (current) use of non-steroidal anti-inflammatories (NSAID): Secondary | ICD-10-CM | POA: Insufficient documentation

## 2016-07-08 DIAGNOSIS — C3411 Malignant neoplasm of upper lobe, right bronchus or lung: Secondary | ICD-10-CM | POA: Insufficient documentation

## 2016-07-08 DIAGNOSIS — I10 Essential (primary) hypertension: Secondary | ICD-10-CM | POA: Insufficient documentation

## 2016-07-08 DIAGNOSIS — D649 Anemia, unspecified: Secondary | ICD-10-CM | POA: Diagnosis not present

## 2016-07-08 DIAGNOSIS — C771 Secondary and unspecified malignant neoplasm of intrathoracic lymph nodes: Secondary | ICD-10-CM | POA: Insufficient documentation

## 2016-07-08 DIAGNOSIS — Z9221 Personal history of antineoplastic chemotherapy: Secondary | ICD-10-CM | POA: Insufficient documentation

## 2016-07-08 DIAGNOSIS — Z87891 Personal history of nicotine dependence: Secondary | ICD-10-CM | POA: Diagnosis not present

## 2016-07-08 DIAGNOSIS — R590 Localized enlarged lymph nodes: Secondary | ICD-10-CM | POA: Diagnosis not present

## 2016-07-08 DIAGNOSIS — C779 Secondary and unspecified malignant neoplasm of lymph node, unspecified: Secondary | ICD-10-CM | POA: Diagnosis present

## 2016-07-08 DIAGNOSIS — C3490 Malignant neoplasm of unspecified part of unspecified bronchus or lung: Secondary | ICD-10-CM

## 2016-07-08 HISTORY — PX: VIDEO BRONCHOSCOPY WITH ENDOBRONCHIAL ULTRASOUND: SHX6177

## 2016-07-08 HISTORY — DX: Dyspnea, unspecified: R06.00

## 2016-07-08 HISTORY — DX: Malignant (primary) neoplasm, unspecified: C80.1

## 2016-07-08 LAB — COMPREHENSIVE METABOLIC PANEL
ALT: 15 U/L — ABNORMAL LOW (ref 17–63)
AST: 27 U/L (ref 15–41)
Albumin: 3.9 g/dL (ref 3.5–5.0)
Alkaline Phosphatase: 54 U/L (ref 38–126)
Anion gap: 11 (ref 5–15)
BUN: 16 mg/dL (ref 6–20)
CO2: 22 mmol/L (ref 22–32)
Calcium: 9.7 mg/dL (ref 8.9–10.3)
Chloride: 104 mmol/L (ref 101–111)
Creatinine, Ser: 1.04 mg/dL (ref 0.61–1.24)
GFR calc Af Amer: >60 mL/min (ref >60)
GFR calc non Af Amer: >60 mL/min (ref >60)
Glucose, Bld: 113 mg/dL — ABNORMAL HIGH (ref 65–99)
Potassium: 5.1 mmol/L (ref 3.5–5.1)
Sodium: 137 mmol/L (ref 135–145)
Total Bilirubin: 0.2 mg/dL — ABNORMAL LOW (ref 0.3–1.2)
Total Protein: 6.9 g/dL (ref 6.5–8.1)

## 2016-07-08 LAB — CBC
HCT: 37.5 % — ABNORMAL LOW (ref 39.0–52.0)
Hemoglobin: 13.1 g/dL (ref 13.0–17.0)
MCH: 32.5 pg (ref 26.0–34.0)
MCHC: 34.9 g/dL (ref 30.0–36.0)
MCV: 93.1 fL (ref 78.0–100.0)
Platelets: 233 10*3/uL (ref 150–400)
RBC: 4.03 MIL/uL — ABNORMAL LOW (ref 4.22–5.81)
RDW: 12.5 % (ref 11.5–15.5)
WBC: 7.2 10*3/uL (ref 4.0–10.5)

## 2016-07-08 LAB — PROTIME-INR
INR: 1.02
Prothrombin Time: 13.4 seconds (ref 11.4–15.2)

## 2016-07-08 LAB — APTT: aPTT: 26 seconds (ref 24–36)

## 2016-07-08 SURGERY — BRONCHOSCOPY, WITH EBUS
Anesthesia: General

## 2016-07-08 MED ORDER — FENTANYL CITRATE (PF) 100 MCG/2ML IJ SOLN
INTRAMUSCULAR | Status: DC | PRN
  Administered 2016-07-08: 50 ug via INTRAVENOUS

## 2016-07-08 MED ORDER — PHENYLEPHRINE HCL 10 MG/ML IJ SOLN
INTRAMUSCULAR | Status: DC | PRN
  Administered 2016-07-08: 40 ug/min via INTRAVENOUS

## 2016-07-08 MED ORDER — DEXTROSE 5 % IV SOLN
1.5000 g | INTRAVENOUS | Status: AC
  Administered 2016-07-08: 1.5 g via INTRAVENOUS
  Filled 2016-07-08: qty 1.5

## 2016-07-08 MED ORDER — EPHEDRINE 5 MG/ML INJ
INTRAVENOUS | Status: AC
  Filled 2016-07-08: qty 10

## 2016-07-08 MED ORDER — 0.9 % SODIUM CHLORIDE (POUR BTL) OPTIME
TOPICAL | Status: DC | PRN
  Administered 2016-07-08: 1000 mL

## 2016-07-08 MED ORDER — SUGAMMADEX SODIUM 200 MG/2ML IV SOLN
INTRAVENOUS | Status: DC | PRN
  Administered 2016-07-08: 200 mg via INTRAVENOUS

## 2016-07-08 MED ORDER — ONDANSETRON HCL 4 MG/2ML IJ SOLN
INTRAMUSCULAR | Status: AC
  Filled 2016-07-08: qty 2

## 2016-07-08 MED ORDER — LACTATED RINGERS IV SOLN
INTRAVENOUS | Status: DC
  Administered 2016-07-08: 10:00:00 via INTRAVENOUS

## 2016-07-08 MED ORDER — ROCURONIUM BROMIDE 100 MG/10ML IV SOLN
INTRAVENOUS | Status: DC | PRN
  Administered 2016-07-08: 10 mg via INTRAVENOUS
  Administered 2016-07-08: 50 mg via INTRAVENOUS

## 2016-07-08 MED ORDER — MIDAZOLAM HCL 2 MG/2ML IJ SOLN
INTRAMUSCULAR | Status: AC
  Filled 2016-07-08: qty 2

## 2016-07-08 MED ORDER — ONDANSETRON HCL 4 MG/2ML IJ SOLN
INTRAMUSCULAR | Status: DC | PRN
Start: 2016-07-08 — End: 2016-07-08
  Administered 2016-07-08: 4 mg via INTRAVENOUS

## 2016-07-08 MED ORDER — EPINEPHRINE PF 1 MG/ML IJ SOLN
INTRAMUSCULAR | Status: AC
  Filled 2016-07-08: qty 2

## 2016-07-08 MED ORDER — PROPOFOL 10 MG/ML IV BOLUS
INTRAVENOUS | Status: AC
  Filled 2016-07-08: qty 20

## 2016-07-08 MED ORDER — FENTANYL CITRATE (PF) 100 MCG/2ML IJ SOLN: 25.0000 ug | INTRAMUSCULAR | Status: DC | PRN

## 2016-07-08 MED ORDER — FENTANYL CITRATE (PF) 250 MCG/5ML IJ SOLN
INTRAMUSCULAR | Status: AC
  Filled 2016-07-08: qty 5

## 2016-07-08 MED ORDER — LIDOCAINE HCL (CARDIAC) 20 MG/ML IV SOLN
INTRAVENOUS | Status: DC | PRN
  Administered 2016-07-08: 60 mg via INTRAVENOUS

## 2016-07-08 MED ORDER — PROPOFOL 10 MG/ML IV BOLUS
INTRAVENOUS | Status: DC | PRN
  Administered 2016-07-08: 120 mg via INTRAVENOUS

## 2016-07-08 SURGICAL SUPPLY — 52 items
BLADE SURG 10 STRL SS (BLADE) IMPLANT
BRUSH CYTOL CELLEBRITY 1.5X140 (MISCELLANEOUS) IMPLANT
CANISTER SUCTION 2500CC (MISCELLANEOUS) ×2 IMPLANT
CLIP TI MEDIUM 6 (CLIP) IMPLANT
CONT SPEC 4OZ CLIKSEAL STRL BL (MISCELLANEOUS) ×2 IMPLANT
COVER DOME SNAP 22 D (MISCELLANEOUS) ×4 IMPLANT
COVER SURGICAL LIGHT HANDLE (MISCELLANEOUS) IMPLANT
COVER TABLE BACK 60X90 (DRAPES) ×2 IMPLANT
DERMABOND ADVANCED (GAUZE/BANDAGES/DRESSINGS)
DERMABOND ADVANCED .7 DNX12 (GAUZE/BANDAGES/DRESSINGS) IMPLANT
DRAPE LAPAROTOMY T 102X78X121 (DRAPES) IMPLANT
DRSG AQUACEL AG ADV 3.5X14 (GAUZE/BANDAGES/DRESSINGS) IMPLANT
ELECT CAUTERY BLADE 6.4 (BLADE) IMPLANT
ELECT REM PT RETURN 9FT ADLT (ELECTROSURGICAL)
ELECTRODE REM PT RTRN 9FT ADLT (ELECTROSURGICAL) IMPLANT
FORCEPS BIOP RJ4 1.8 (CUTTING FORCEPS) IMPLANT
GAUZE SPONGE 4X4 12PLY STRL (GAUZE/BANDAGES/DRESSINGS) ×2 IMPLANT
GAUZE SPONGE 4X4 16PLY XRAY LF (GAUZE/BANDAGES/DRESSINGS) IMPLANT
GLOVE BIO SURGEON STRL SZ 6.5 (GLOVE) ×4 IMPLANT
GLOVE BIOGEL PI IND STRL 6.5 (GLOVE) ×2 IMPLANT
GLOVE BIOGEL PI INDICATOR 6.5 (GLOVE) ×2
GOWN STRL REUS W/ TWL LRG LVL3 (GOWN DISPOSABLE) IMPLANT
GOWN STRL REUS W/TWL LRG LVL3 (GOWN DISPOSABLE)
HEMOSTAT SURGICEL 2X14 (HEMOSTASIS) IMPLANT
KIT BASIN OR (CUSTOM PROCEDURE TRAY) IMPLANT
KIT CLEAN ENDO COMPLIANCE (KITS) ×4 IMPLANT
KIT ROOM TURNOVER OR (KITS) ×2 IMPLANT
MARKER SKIN DUAL TIP RULER LAB (MISCELLANEOUS) ×2 IMPLANT
NEEDLE BIOPSY TRANSBRONCH 21G (NEEDLE) IMPLANT
NEEDLE BLUNT 18X1 FOR OR ONLY (NEEDLE) IMPLANT
NEEDLE EBUS SONO TIP PENTAX (NEEDLE) ×2 IMPLANT
NEEDLE SYS SONOTIP II EBUSTBNA (NEEDLE) ×2 IMPLANT
NS IRRIG 1000ML POUR BTL (IV SOLUTION) ×2 IMPLANT
OIL SILICONE PENTAX (PARTS (SERVICE/REPAIRS)) ×2 IMPLANT
PACK SURGICAL SETUP 50X90 (CUSTOM PROCEDURE TRAY) IMPLANT
PAD ARMBOARD 7.5X6 YLW CONV (MISCELLANEOUS) ×4 IMPLANT
PENCIL BUTTON HOLSTER BLD 10FT (ELECTRODE) IMPLANT
SPONGE INTESTINAL PEANUT (DISPOSABLE) IMPLANT
STAPLER VISISTAT 35W (STAPLE) IMPLANT
SUT VIC AB 3-0 SH 18 (SUTURE) IMPLANT
SUT VICRYL 4-0 PS2 18IN ABS (SUTURE) IMPLANT
SWAB COLLECTION DEVICE MRSA (MISCELLANEOUS) IMPLANT
SYR 20CC LL (SYRINGE) ×2 IMPLANT
SYR 20ML ECCENTRIC (SYRINGE) ×4 IMPLANT
SYRINGE 10CC LL (SYRINGE) IMPLANT
TOWEL OR 17X24 6PK STRL BLUE (TOWEL DISPOSABLE) ×2 IMPLANT
TOWEL OR 17X26 10 PK STRL BLUE (TOWEL DISPOSABLE) IMPLANT
TRAP SPECIMEN MUCOUS 40CC (MISCELLANEOUS) ×2 IMPLANT
TUBE ANAEROBIC SPECIMEN COL (MISCELLANEOUS) IMPLANT
TUBE CONNECTING 12X1/4 (SUCTIONS) IMPLANT
TUBE CONNECTING 20X1/4 (TUBING) ×2 IMPLANT
WATER STERILE IRR 1000ML POUR (IV SOLUTION) ×2 IMPLANT

## 2016-07-08 NOTE — Anesthesia Postprocedure Evaluation (Signed)
Anesthesia Post Note  Patient: Donald Delacruz  Procedure(s) Performed: Procedure(s) (LRB): VIDEO BRONCHOSCOPY WITH ENDOBRONCHIAL ULTRASOUND (N/A)  Patient location during evaluation: PACU Anesthesia Type: General Level of consciousness: awake and alert Pain management: pain level controlled Vital Signs Assessment: post-procedure vital signs reviewed and stable Respiratory status: spontaneous breathing, nonlabored ventilation and respiratory function stable Cardiovascular status: blood pressure returned to baseline and stable Postop Assessment: no signs of nausea or vomiting Anesthetic complications: no       Last Vitals:  Vitals:   07/08/16 1217 07/08/16 1232  BP: 123/65 120/60  Pulse: 89 76  Resp: 16 (!) 21  Temp:      Last Pain:  Vitals:   07/08/16 1240  TempSrc:   PainSc: 0-No pain                 Britnee Mcdevitt,W. EDMOND

## 2016-07-08 NOTE — Brief Op Note (Signed)
      [address]       [place] [address]             [phone]      07/08/2016  12:15 PM  PATIENT:  Donald Delacruz  76 y.o. male  PRE-OPERATIVE DIAGNOSIS:  metastatic adenopathy  POST-OPERATIVE DIAGNOSIS:  metastatic adenopathy  PROCEDURE:  Procedure(s): VIDEO BRONCHOSCOPY WITH ENDOBRONCHIAL ULTRASOUND (N/A)  SURGEON:  Surgeon(s) and Role:    * [name], MD - Primary    ANESTHESIA:   general  EBL:  No intake/output data recorded.  BLOOD ADMINISTERED:none  DRAINS: none   LOCAL MEDICATIONS USED:  NONE  SPECIMEN:  Source of Specimen:  # 7 medsatinal lymph nodes   DISPOSITION OF SPECIMEN:  PATHOLOGY  COUNTS:  YES   DICTATION: .Dragon Dictation  PLAN OF CARE: Discharge to home after PACU  PATIENT DISPOSITION:  PACU - hemodynamically stable.   Delay start of Pharmacological VTE agent (>24hrs) due to surgical blood loss or risk of bleeding: yes

## 2016-07-08 NOTE — H&P (Signed)
[address]       [place] [address]             [phone]                  [facility] Medical Record #[id number] Date of Birth: Oct 08, 1940  Referring MD:No ref. provider found Primary Cardiology: Primary Care:WILSON,FRED Mallie Mussel, MD  Chief Complaint:  Follow Up Visit 01/30/2015  OPERATIVE REPORT PREOPERATIVE DIAGNOSIS: Adenocarcinoma, right upper lobe. POSTOPERATIVE DIAGNOSIS: Adenocarcinoma, right upper lobe. SURGICAL PROCEDURE: Video bronchoscopy, right video-assisted thoracoscopy with right upper lobectomy, lymph node dissection, and placement of On-Q. SURGEON: Lanelle Bal, MD  Lung cancer, right upper lobe   Staging form: Lung, AJCC 7th Edition     Clinical stage from 01/08/2015: Stage IIB (T3(2), N0, M0) - Signed by [name], MD on 02/02/2015     Pathologic stage from 02/02/2015: Stage IB (T2a, N0, cM0) - Signed by [name], MD on 02/02/2015  Myriad Prognostic score 31  -  25% 5 year mortality  PRIOR THERAPY:  1) Status post right upper lobectomy with lymph node dissection on 01/30/2015. The Myriad lung cancer prognostic test showed high risk prognostic score of 31 and 25% risk of dying from lung cancer in 5 years. 2) Adjuvant systemic chemotherapy with carboplatin for AUC of 5 and Alimta 500 MG/M2 every 3 weeks. First dose 03/22/2015. Status post 4 cycles.  CURRENT THERAPY: Observation.   History of Present Illness:    The patient is seen in routine follow-up after RUL lobectomy as described above. He had a follow-up CT scan at the end of June 2017. Repeat CT scan in December 2017 suggested increasing mediastinal adenopathy that was not present previously. A follow-up PET scan was done several days ago.   He notes that he is not as active as he passed, does note some exertional shortness of breath. He's had no hemoptysis He notes he has had intermittent headaches particular behind his right eye over the  past month  Zubrod Score: At the time of surgery this patient's most appropriate activity status/level should be described as: []     0    Normal activity, no symptoms [x]     1    Restricted in physical strenuous activity but ambulatory, able to do out light work []     2    Ambulatory and capable of self care, unable to do work activities, up and about                 >50 % of waking hours                                                                                   []     3    Only limited self care, in bed greater than 50% of waking hours []     4    Completely disabled, no self care, confined to bed or chair []     5    Moribund  History  Smoking Status  . Former Smoker  . Packs/day: 1.00  . Years: 39.00  . Types: Cigarettes  . Quit date:  06/30/1997  Smokeless Tobacco  . Never Used    Comment: SMOKED PIPES ALSO       Allergies  Allergen Reactions  . Amlodipine Swelling  . Benicar [Olmesartan]     Dizziness,malaise   . Nisoldipine Swelling    Current Facility-Administered Medications  Medication Dose Route Frequency Provider Last Rate Last Dose  . cefUROXime (ZINACEF) 1.5 g in dextrose 5 % 50 mL IVPB  1.5 g Intravenous 60 min Pre-Op [name], MD      . lactated ringers infusion   Intravenous Continuous Roderic Palau, MD       Facility-Administered Medications Ordered in Other Encounters  Medication Dose Route Frequency Provider Last Rate Last Dose  . fludeoxyglucose F - 18 (FDG) injection 8.4 millicurie  8.4 millicurie Intravenous Once PRN Barnet Glasgow, MD           Physical Exam: BP 135/70   Pulse 88   Temp 98.8 F (37.1 C) (Oral)   Resp 18   Ht 5' 10"  (1.778 m)   Wt 170 lb 8 oz (77.3 kg)   SpO2 100%   BMI 24.46 kg/m   Physical exam General : NAD Lungs: clear BS Cor: RRR, S1S2 normal, no murmur Abd: soft, nontender, nondistended Ext: Warm and well perfused Incisions: well healed, right chest Patient has no cervical or supracervical or  adenopathy In spite of the known slightly hypermetabolic activity right supraclavicular area do not appreciate any palpable node that could be biopsied.  Diagnostic Studies & Laboratory data:         Recent Radiology Findings: Ct Chest W Contrast  Result Date: 06/10/2016 CLINICAL DATA:  Followup lung cancer. EXAM: CT CHEST WITH CONTRAST TECHNIQUE: Multidetector CT imaging of the chest was performed during intravenous contrast administration. CONTRAST:  66m ISOVUE-300 IOPAMIDOL (ISOVUE-300) INJECTION 61% COMPARISON:  12/17/2015 FINDINGS: Cardiovascular: Heart size is normal. Aortic atherosclerosis. Calcification within the LAD coronary artery noted. Mediastinum/Nodes: The trachea appears patent and is midline. Increased soft tissue stranding within the anterior mediastinal fat. Pre-vascular lymph node measures 1.1 cm, image 53 of series 2. New from previous exam. 8 mm right paratracheal lymph node is identified, image 53 of series 2. Also new from previous exam. 1.1 cm right paratracheal lymph node is noted, image 63 of series 2. Previously 1.0 cm. 1 cm sub- carinal lymph node is new from previous exam. Lungs/Pleura: Moderate changes of centrilobular emphysema. Postoperative changes from right upper lobectomy is again noted. Upper Abdomen: 8 mm low-attenuation structure within the lateral segment of left lobe of liver is unchanged from previous exam. 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 Cyst arising from upper pole of the left kidney is identified. Stable tiny low density foci within the posterior right lobe of liver. 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 No aggressive lytic or sclerotic bone lesions. IMPRESSION: 1. New right paratracheal and prevascular lymph nodes worrisome for new metastatic adenopathy. Consider further evaluation with PET-CT. 2. Diffuse bronchial wall  thickening with emphysema, as above; imaging findings suggestive of underlying COPD. Electronically Signed   By: TKerby MoorsM.D.   On: 06/10/2016 15:21   Nm Pet Image Restag (ps) Skull Base To Thigh  Result Date: 07/03/2016 CLINICAL DATA:  Subsequent treatment strategy for lung cancer. EXAM: NUCLEAR MEDICINE PET SKULL BASE TO THIGH TECHNIQUE: 8.4 mCi F-18 FDG was injected intravenously. Full-ring PET imaging was performed from the  skull base to thigh after the radiotracer. CT data was obtained and used for attenuation correction and anatomic localization. FASTING BLOOD GLUCOSE:  Value: 121 mg/dl COMPARISON:  12/06/2014 FINDINGS: NECK Hypermetabolic right supraclavicular lymph node is new from previous exam. This measures 8 mm and has an SUV max equal to 6.73, image 50 of series 4. CHEST New hypermetabolic bilateral mediastinal and right hilar lymph nodes identified. Index left pre-vascular lymph node measures 8 mm and has an SUV max equal to 7.73, image 60 of series 4. Hypermetabolic right paratracheal lymph node measures 1.3 cm and has an SUV max equal to 6.9, image 68 of series 4. Hypermetabolic sub- carinal lymph node measures 1.6 cm and has an SUV max equal to 11.27. Posterior mediastinal lymph node between to the left mainstem bronchus and esophagus measures 1.1 cm and has an SUV max equal to 8.01, image 83 of series 4. Hypermetabolic right hilar node measures 1.8 cm and has an SUV max equal to 8.6, image 78 of series 4. Status post right upper lobectomy. No suspicious pulmonary nodules on the CT scan. ABDOMEN/PELVIS No abnormal hypermetabolic activity within the liver, pancreas, adrenal glands, or spleen. No hypermetabolic lymph nodes in the abdomen or pelvis. SKELETON Subtle focus of increased uptake within the spinous process of T1 has an SUV max equal to 3.87. Additional subtle focus of mild increased uptake within the right iliac bone has an SUV max equal to 3.89. Within the intertrochanteric region  of the proximal right femur there is a subtle focus of increased uptake within SUV max equal to 3.44. IMPRESSION: 1. Examination is positive for recurrent hypermetabolic tumor. There are new hypermetabolic right supraclavicular, bilateral mediastinal and right hilar lymph nodes compatible with metastatic adenopathy. 2. Several new foci of subtle increased uptake within the axial and appendicular skeleton worrisome for bone metastases. Electronically Signed   By: Kerby Moors M.D.   On: 07/03/2016 09:16   CLINICAL DATA:  76 year old male with history of right-sided lung cancer status post right upper lobectomy and chemotherapy (completed in February 2016). Followup study. Former smoker, quit in 1999.  EXAM: CT CHEST WITH CONTRAST  TECHNIQUE: Multidetector CT imaging of the chest was performed during intravenous contrast administration.  CONTRAST:  75m ISOVUE-300 IOPAMIDOL (ISOVUE-300) INJECTION 61%  COMPARISON:  Chest CT 06/18/2015.  FINDINGS: Mediastinum/Lymph Nodes: Heart size is normal. There is no significant pericardial fluid, thickening or pericardial calcification. There is atherosclerosis of the thoracic aorta, the great vessels of the mediastinum and the coronary arteries, including calcified atherosclerotic plaque in the left main, left anterior descending and left circumflex coronary arteries. No pathologically enlarged mediastinal or hilar lymph nodes. Esophagus is unremarkable in appearance. No axillary lymphadenopathy.  Lungs/Pleura: Status post right upper lobectomy. 7 x 3 mm (mean diameter of 5 mm) subpleural nodule in the periphery of the right lower lobe (image 85 of series 5) is unchanged. 13 x 9 mm ground-glass attenuation nodular area in the superior segment of the right lower lobe (image 45 of series 5) is unchanged in retrospect compared to the prior examination. No other larger more suspicious appearing pulmonary nodules or masses are otherwise noted.  No acute consolidative airspace disease. No pleural effusions. Mild diffuse bronchial wall thickening with moderate centrilobular and paraseptal emphysema.  Upper Abdomen: Several sub cm low-attenuation lesions in the liver are too small to definitively characterize, but generally appear similar to prior study from 06/18/2015, favored to represent tiny cysts. Incompletely visualized low-attenuation lesion measuring 2 cm in the  interpolar region of the right kidney is unchanged, compatible with a simple cyst. Other simple cysts are also noted in the upper pole of the left kidney. 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  Musculoskeletal/Soft Tissues: Postthoracotomy changes in the right hemithorax with some bony bridging between the lateral aspects of the right sixth and seventh ribs. There are no aggressive appearing lytic or blastic lesions noted in the visualized portions of the skeleton.  IMPRESSION: 1. Stable postoperative findings of prior right upper lobectomy, without evidence to suggest local recurrence of disease or definite metastatic disease in the thorax. 2. Small subpleural nodule in the periphery of the right lower lobe likely represents a subpleural lymph node and is unchanged. There is also a very subtle 1.3 x 0.9 cm ground-glass attenuation nodule in the superior segment of the right lower lobe, which is unchanged in retrospect but nonspecific. Continued attention on followup studies is recommended to ensure the stability or resolution of this finding. 3. Mild diffuse bronchial thickening with moderate centrilobular and paraseptal emphysema; imaging findings suggestive of underlying COPD.   Electronically Signed   By: Vinnie Langton M.D.   On: 12/17/2015 13:50    I have independently reviewed the above radiology findings and reviewed findings  with the patient.  Recent Labs: Lab Results   Component Value Date   WBC 7.2 07/08/2016   HGB 13.1 07/08/2016   HCT 37.5 (L) 07/08/2016   PLT 233 07/08/2016   GLUCOSE 113 (H) 07/08/2016   ALT 15 (L) 07/08/2016   AST 27 07/08/2016   NA 137 07/08/2016   K 5.1 07/08/2016   CL 104 07/08/2016   CREATININE 1.04 07/08/2016   BUN 16 07/08/2016   CO2 22 07/08/2016   INR 1.02 07/08/2016      Assessment / Plan:   Patient now has CT and PET scan evidence of recurrent disease involving the mediastinum, possible bony involvement right femur though the patient has no symptoms in this area. I recommended to him that we proceed with bronchoscopy ebus with biopsy possible mediastinoscopy to obtain a confirmatory tissue diagnosis. Risks and options were discussed with the patient in detail and he is willing to proceed.   The goals risks and alternatives of the planned surgical procedure bronchoscopy ebus with biopsy possible mediastinoscopy  have been discussed with the patient in detail. The risks of the procedure including death, infection, stroke, myocardial infarction, bleeding, blood transfusion have all been discussed specifically.  I have quoted Earl Many a 2 % of perioperative mortality and a complication rate as high as 10 %. The patient's questions have been answered.Braelynn Lupton is willing  to proceed with the planned procedure.     [name] 07/08/2016 10:12 AM

## 2016-07-08 NOTE — Anesthesia Procedure Notes (Signed)
Procedure Name: Intubation Date/Time: 07/08/2016 10:52 AM Performed by: Carney Living Pre-anesthesia Checklist: Patient identified, Emergency Drugs available, Suction available, Patient being monitored and Timeout performed Patient Re-evaluated:Patient Re-evaluated prior to inductionOxygen Delivery Method: Circle system utilized Preoxygenation: Pre-oxygenation with 100% oxygen Intubation Type: IV induction Ventilation: Mask ventilation without difficulty and Oral airway inserted - appropriate to patient size Laryngoscope Size: Mac and 4 Grade View: Grade II Tube type: Oral Tube size: 8.0 mm Number of attempts: 1 Airway Equipment and Method: Stylet Placement Confirmation: ETT inserted through vocal cords under direct vision,  positive ETCO2 and breath sounds checked- equal and bilateral Secured at: 22 cm Tube secured with: Tape Dental Injury: Teeth and Oropharynx as per pre-operative assessment

## 2016-07-08 NOTE — Discharge Instructions (Signed)

## 2016-07-08 NOTE — Transfer of Care (Signed)
Immediate Anesthesia Transfer of Care Note  Patient: Donald Delacruz  Procedure(s) Performed: Procedure(s): VIDEO BRONCHOSCOPY WITH ENDOBRONCHIAL ULTRASOUND (N/A)  Patient Location: PACU  Anesthesia Type:General  Level of Consciousness: awake, alert , oriented and patient cooperative  Airway & Oxygen Therapy: Patient Spontanous Breathing and Patient connected to nasal cannula oxygen  Post-op Assessment: Report given to RN, Post -op Vital signs reviewed and stable and Patient moving all extremities X 4  Post vital signs: Reviewed and stable  Last Vitals:  Vitals:   07/08/16 0838 07/08/16 1216  BP: 135/70 123/65  Pulse: 88 86  Resp: 18 18  Temp: 37.1 C 36.5 C    Last Pain:  Vitals:   07/08/16 0838  TempSrc: Oral      Patients Stated Pain Goal: 5 (35/70/17 7939)  Complications: No apparent anesthesia complications

## 2016-07-08 NOTE — Anesthesia Preprocedure Evaluation (Addendum)
Anesthesia Evaluation  Patient identified by MRN, date of birth, ID band Patient awake    Reviewed: Allergy & Precautions, H&P , NPO status , Patient's Chart, lab work & pertinent test results, reviewed documented beta blocker date and time   Airway Mallampati: II  TM Distance: >3 FB Neck ROM: Full    Dental no notable dental hx. (+) Teeth Intact, Dental Advisory Given   Pulmonary shortness of breath, COPD, former smoker,    Pulmonary exam normal breath sounds clear to auscultation       Cardiovascular hypertension, Pt. on medications and Pt. on home beta blockers + Peripheral Vascular Disease   Rhythm:Regular Rate:Normal     Neuro/Psych negative neurological ROS  negative psych ROS   GI/Hepatic negative GI ROS, Neg liver ROS,   Endo/Other  negative endocrine ROS  Renal/GU negative Renal ROS  negative genitourinary   Musculoskeletal  (+) Arthritis , Osteoarthritis,    Abdominal   Peds  Hematology  (+) anemia ,   Anesthesia Other Findings   Reproductive/Obstetrics negative OB ROS                            Anesthesia Physical Anesthesia Plan  ASA: III  Anesthesia Plan: General   Post-op Pain Management:    Induction: Intravenous  Airway Management Planned: Oral ETT  Additional Equipment:   Intra-op Plan:   Post-operative Plan: Extubation in OR and Possible Post-op intubation/ventilation  Informed Consent: I have reviewed the patients History and Physical, chart, labs and discussed the procedure including the risks, benefits and alternatives for the proposed anesthesia with the patient or authorized representative who has indicated his/her understanding and acceptance.   Dental advisory given  Plan Discussed with: CRNA  Anesthesia Plan Comments:        Anesthesia Quick Evaluation

## 2016-07-09 ENCOUNTER — Encounter (HOSPITAL_COMMUNITY): Payer: Self-pay | Admitting: Cardiothoracic Surgery

## 2016-07-09 LAB — TYPE AND SCREEN
ABO/RH(D): AB POS
Antibody Screen: NEGATIVE

## 2016-07-10 ENCOUNTER — Other Ambulatory Visit: Payer: Self-pay | Admitting: Cardiothoracic Surgery

## 2016-07-10 ENCOUNTER — Ambulatory Visit
Admission: RE | Admit: 2016-07-10 | Discharge: 2016-07-10 | Disposition: A | Discharge status: Home / Self Care | Payer: Medicare Other | Source: Ambulatory Visit | Attending: Cardiothoracic Surgery | Admitting: Cardiothoracic Surgery

## 2016-07-10 DIAGNOSIS — C3490 Malignant neoplasm of unspecified part of unspecified bronchus or lung: Secondary | ICD-10-CM

## 2016-07-10 DIAGNOSIS — C7931 Secondary malignant neoplasm of brain: Secondary | ICD-10-CM

## 2016-07-10 NOTE — Op Note (Signed)
NAME:  QUIN, MATHENIA NO.:  MEDICAL RECORD NO.:  [id number]  LOCATION:                                 FACILITY:  PHYSICIAN:  Lanelle Bal, MD         DATE OF BIRTH:  DATE OF PROCEDURE:  07/08/2016 DATE OF DISCHARGE:                              OPERATIVE REPORT   PREOPERATIVE DIAGNOSIS:  Mediastinal adenopathy after previous resection of right upper lobe for adenocarcinoma of the lung.  POSTOPERATIVE DIAGNOSIS:  Mediastinal adenopathy after previous resection of right upper lobe for adenocarcinoma of the lung.  SURGICAL PROCEDURE:  Video bronchoscopy, endobronchial ultrasound-guided biopsy with transbronchial biopsies.  SURGEON:  Lanelle Bal, MD.  BRIEF HISTORY:  The patient is a 76 year old male who a year and a half before had undergone right upper lobectomy for a clinical stage IIB pathological stage IB adenocarcinoma of the lung.  Postoperatively, he had done well and had been followed at 6 month intervals with CT scan of the chest.  The scan in June showed no evidence of recurrence.  This late December, follow up CT scan suggested significant new mediastinal adenopathy.  A PET scan confirm this.  It was recommended to the patient that we proceed with bronchoscopy and EBUS to obtain a tissue diagnosis to confirm the recurrence.  After the patient's original procedure, he had 4 cycles of chemotherapy.  The risks and options were discussed with him and he was agreeable with proceeding.  DESCRIPTION OF PROCEDURE:  The patient underwent general endotracheal anesthesia without incident.  A single-lumen endotracheal tube was placed through this endotracheal tube.  After appropriate time-out was performed, a fiberoptic bronchoscope 2.8 mm was passed to the subsegmental level both to the right and left tracheobronchial tree.  No definite endobronchial lesions were appreciated.  The right upper lobe bronchus had been stapled.   There did not  appear to be any tumor at the takeoff of the right upper lobe bronchial stump.  We then removed the bronchoscope and passed EBUS scope in the vicinity of this #7 mediastinal nodes, there was obvious extensive adenopathy.  We then proceeded with multiple passes with a transbronchial biopsy under the guidance of the EBUS scope and sent quick smears on 3 of the passes to Pathology and then took additional passes and placed just in side light for cell block.  After the initial smear which Pathology reported as malignant, Dr. Tresa Moore  could not tell if there was adenocarcinoma consistent with the previous lung and raised the issue of possible lymphoma.  After this, we took additional passes in this #7 node and placed the aspirations in small amount of saline for possible cell flow cytometry if this was indicated based on further stain.  The patient tolerated the procedure without complication.  The tracheobronchial tree was cleared of all secretions. The patient was extubated in the operating room and transferred to the recovery room for postoperative care.  The blood loss was minimal.     Lanelle Bal, MD     EG/MEDQ  D:  07/09/2016  T:  07/10/2016  Job:  [id number]

## 2016-07-12 ENCOUNTER — Ambulatory Visit
Admission: RE | Admit: 2016-07-12 | Discharge: 2016-07-12 | Disposition: A | Discharge status: Home / Self Care | Payer: Medicare Other | Source: Ambulatory Visit | Attending: Cardiothoracic Surgery | Admitting: Cardiothoracic Surgery

## 2016-07-12 ENCOUNTER — Other Ambulatory Visit: Payer: Medicare Other

## 2016-07-12 DIAGNOSIS — C3490 Malignant neoplasm of unspecified part of unspecified bronchus or lung: Secondary | ICD-10-CM

## 2016-07-12 DIAGNOSIS — C7931 Secondary malignant neoplasm of brain: Secondary | ICD-10-CM

## 2016-07-12 MED ORDER — GADOBENATE DIMEGLUMINE 529 MG/ML IV SOLN
15.0000 mL | Freq: Once | INTRAVENOUS | Status: AC | PRN
  Administered 2016-07-12: 15 mL via INTRAVENOUS

## 2016-07-14 ENCOUNTER — Telehealth: Payer: Self-pay | Admitting: Medical Oncology

## 2016-07-14 ENCOUNTER — Other Ambulatory Visit: Payer: Self-pay | Admitting: Medical Oncology

## 2016-07-14 ENCOUNTER — Encounter: Payer: Self-pay | Admitting: *Deleted

## 2016-07-14 DIAGNOSIS — C3411 Malignant neoplasm of upper lobe, right bronchus or lung: Secondary | ICD-10-CM

## 2016-07-14 NOTE — Telephone Encounter (Signed)
clarified reason for US guided core bx LN-per [facility] pathology recommended procedure because not enough tissue on other bx.

## 2016-07-14 NOTE — Progress Notes (Signed)
Oncology Nurse Navigator Documentation  Oncology Nurse Navigator Flowsheets 07/14/2016  Navigator Location [facility]  Navigator Encounter Type Other/Donald Delacruz's biopsy is scheduled for 1/24.  Patient is to see Dr. Julien Nordmann on 1/22.  I updated Dr. Julien Nordmann. He ask to reschedule the week of 1/29.  I notified scheduling to call and cancel appt with Dr. Julien Nordmann on 1/22 and re-schedule an appt on 1/29  Treatment Phase Follow-up  Barriers/Navigation Needs Coordination of Care  Interventions Coordination of Care  Coordination of Care Other  Acuity Level 2  Acuity Level 2 Assistance expediting appointments  Time Spent with Patient 76

## 2016-07-14 NOTE — Progress Notes (Signed)
Oncology Nurse Navigator Documentation  Oncology Nurse Navigator Flowsheets 07/14/2016  Navigator Location [facility]  Treatment Phase Follow-up/I followed up with Dr. Julien Nordmann today regarding Mr. Hunger pathology.  Dr. Julien Nordmann gave me a verbal order for [place] core biopsy.  Order placed.    Barriers/Navigation Needs Coordination of Care  Interventions Coordination of Care  Coordination of Care Other  Acuity Level 1  Acuity Level 1 Minimal follow up required  Time Spent with Patient 30

## 2016-07-15 ENCOUNTER — Telehealth: Payer: Self-pay | Admitting: Internal Medicine

## 2016-07-15 NOTE — Telephone Encounter (Signed)
Pt called to r/s 1/29 appt to 2/1 due to pt having another appt on 1/29

## 2016-07-15 NOTE — Telephone Encounter (Signed)
sw pt to confirm 1/29 appt date/time per LOS

## 2016-07-21 ENCOUNTER — Other Ambulatory Visit: Payer: Medicare Other

## 2016-07-21 ENCOUNTER — Ambulatory Visit: Payer: Medicare Other | Admitting: Internal Medicine

## 2016-07-22 ENCOUNTER — Ambulatory Visit (HOSPITAL_COMMUNITY): Payer: Medicare Other

## 2016-07-22 ENCOUNTER — Other Ambulatory Visit: Payer: Self-pay | Admitting: Student

## 2016-07-23 ENCOUNTER — Encounter (HOSPITAL_COMMUNITY): Payer: Self-pay

## 2016-07-23 ENCOUNTER — Ambulatory Visit (HOSPITAL_COMMUNITY)
Admission: RE | Admit: 2016-07-23 | Discharge: 2016-07-23 | Disposition: A | Discharge status: Home / Self Care | Payer: Medicare Other | Source: Ambulatory Visit | Attending: Internal Medicine | Admitting: Internal Medicine

## 2016-07-23 DIAGNOSIS — Z7982 Long term (current) use of aspirin: Secondary | ICD-10-CM | POA: Insufficient documentation

## 2016-07-23 DIAGNOSIS — M1990 Unspecified osteoarthritis, unspecified site: Secondary | ICD-10-CM | POA: Insufficient documentation

## 2016-07-23 DIAGNOSIS — I10 Essential (primary) hypertension: Secondary | ICD-10-CM | POA: Insufficient documentation

## 2016-07-23 DIAGNOSIS — Z902 Acquired absence of lung [part of]: Secondary | ICD-10-CM | POA: Diagnosis not present

## 2016-07-23 DIAGNOSIS — C3411 Malignant neoplasm of upper lobe, right bronchus or lung: Secondary | ICD-10-CM

## 2016-07-23 DIAGNOSIS — Z08 Encounter for follow-up examination after completed treatment for malignant neoplasm: Secondary | ICD-10-CM | POA: Insufficient documentation

## 2016-07-23 DIAGNOSIS — Z85118 Personal history of other malignant neoplasm of bronchus and lung: Secondary | ICD-10-CM | POA: Insufficient documentation

## 2016-07-23 DIAGNOSIS — J439 Emphysema, unspecified: Secondary | ICD-10-CM | POA: Diagnosis not present

## 2016-07-23 DIAGNOSIS — Z87891 Personal history of nicotine dependence: Secondary | ICD-10-CM | POA: Insufficient documentation

## 2016-07-23 DIAGNOSIS — G2581 Restless legs syndrome: Secondary | ICD-10-CM | POA: Diagnosis not present

## 2016-07-23 DIAGNOSIS — G4700 Insomnia, unspecified: Secondary | ICD-10-CM | POA: Diagnosis not present

## 2016-07-23 DIAGNOSIS — I700 Atherosclerosis of aorta: Secondary | ICD-10-CM | POA: Insufficient documentation

## 2016-07-23 DIAGNOSIS — I2510 Atherosclerotic heart disease of native coronary artery without angina pectoris: Secondary | ICD-10-CM | POA: Insufficient documentation

## 2016-07-23 LAB — CBC
HEMATOCRIT: 35.3 % — AB (ref 39.0–52.0)
Hemoglobin: 12 g/dL — ABNORMAL LOW (ref 13.0–17.0)
MCH: 31.4 pg (ref 26.0–34.0)
MCHC: 34 g/dL (ref 30.0–36.0)
MCV: 92.4 fL (ref 78.0–100.0)
PLATELETS: 262 10*3/uL (ref 150–400)
RBC: 3.82 MIL/uL — ABNORMAL LOW (ref 4.22–5.81)
RDW: 12.6 % (ref 11.5–15.5)
WBC: 7.2 10*3/uL (ref 4.0–10.5)

## 2016-07-23 LAB — PROTIME-INR
INR: 0.97
Prothrombin Time: 12.9 seconds (ref 11.4–15.2)

## 2016-07-23 LAB — APTT: aPTT: 25 seconds (ref 24–36)

## 2016-07-23 MED ORDER — MIDAZOLAM HCL 2 MG/2ML IJ SOLN
INTRAMUSCULAR | Status: AC | PRN
  Administered 2016-07-23 (×2): 1 mg via INTRAVENOUS

## 2016-07-23 MED ORDER — MIDAZOLAM HCL 2 MG/2ML IJ SOLN
INTRAMUSCULAR | Status: AC
  Filled 2016-07-23: qty 4

## 2016-07-23 MED ORDER — SODIUM CHLORIDE 0.9 % IV SOLN
INTRAVENOUS | Status: DC
  Administered 2016-07-23: 12:00:00 via INTRAVENOUS

## 2016-07-23 MED ORDER — FENTANYL CITRATE (PF) 100 MCG/2ML IJ SOLN
INTRAMUSCULAR | Status: AC
  Filled 2016-07-23: qty 4

## 2016-07-23 MED ORDER — FENTANYL CITRATE (PF) 100 MCG/2ML IJ SOLN
INTRAMUSCULAR | Status: AC | PRN
  Administered 2016-07-23: 50 ug via INTRAVENOUS
  Administered 2016-07-23 (×2): 25 ug via INTRAVENOUS

## 2016-07-23 NOTE — Procedures (Signed)
R neck LN Bx 18 g core times three No comp/EBL

## 2016-07-23 NOTE — Discharge Instructions (Signed)
Needle Biopsy, Care After Introduction These instructions give you information about caring for yourself after your procedure. Your doctor may also give you more specific instructions. Call your doctor if you have any problems or questions after your procedure. Follow these instructions at home:  Rest as told by your doctor.  Take medicines only as told by your doctor.  There are many different ways to close and cover the biopsy site, including stitches (sutures), skin glue, and adhesive strips. Follow instructions from your doctor about:  How to take care of your biopsy site.  When and how you should change your bandage (dressing).  When you should remove your dressing.  Removing whatever was used to close your biopsy site.  Check your biopsy site every day for signs of infection. Watch for:  Redness, swelling, or pain.  Fluid, blood, or pus. Contact a doctor if:  You have a fever.  You have redness, swelling, or pain at the biopsy site, and it lasts longer than a few days.  You have fluid, blood, or pus coming from the biopsy site.  You feel sick to your stomach (nauseous).  You throw up (vomit). Get help right away if:  You are short of breath.  You have trouble breathing.  Your chest hurts.  You feel dizzy or you pass out (faint).  You have bleeding that does not stop with pressure or a bandage.  You cough up blood.  Your belly (abdomen) hurts. This information is not intended to replace advice given to you by your health care provider. Make sure you discuss any questions you have with your health care provider. Document Released: 05/29/2008 Document Revised: 11/22/2015 Document Reviewed: 06/12/2014  2017 Elsevier Moderate Conscious Sedation, Adult, Care After These instructions provide you with information about caring for yourself after your procedure. Your health care provider may also give you more specific instructions. Your treatment has been planned  according to current medical practices, but problems sometimes occur. Call your health care provider if you have any problems or questions after your procedure. What can I expect after the procedure? After your procedure, it is common:  To feel sleepy for several hours.  To feel clumsy and have poor balance for several hours.  To have poor judgment for several hours.  To vomit if you eat too soon. Follow these instructions at home: For at least 24 hours after the procedure:   Do not:  Participate in activities where you could fall or become injured.  Drive.  Use heavy machinery.  Drink alcohol.  Take sleeping pills or medicines that cause drowsiness.  Make important decisions or sign legal documents.  Take care of children on your own.  Rest. Eating and drinking  Follow the diet recommended by your health care provider.  If you vomit:  Drink water, juice, or soup when you can drink without vomiting.  Make sure you have little or no nausea before eating solid foods. General instructions  Have a responsible adult stay with you until you are awake and alert.  Take over-the-counter and prescription medicines only as told by your health care provider.  If you smoke, do not smoke without supervision.  Keep all follow-up visits as told by your health care provider. This is important. Contact a health care provider if:  You keep feeling nauseous or you keep vomiting.  You feel light-headed.  You develop a rash.  You have a fever. Get help right away if:  You have trouble breathing. This information  is not intended to replace advice given to you by your health care provider. Make sure you discuss any questions you have with your health care provider. Document Released: 04/06/2013 Document Revised: 11/19/2015 Document Reviewed: 10/06/2015 Elsevier Interactive Patient Education  2017 [employer].

## 2016-07-23 NOTE — H&P (Signed)
Chief Complaint: Patient was seen in consultation today for   Referring Physician(s):  Dr. Curt Bears  Supervising Physician: Marybelle Killings  Patient Status: [facility] - Out-pt  History of Present Illness: Donald Delacruz is a 76 y.o. male with history of emphysema, osteoarthritis, and lung cancer s/p right upper lobectomy with lymph node dissection in 01/30/2015 is being evaluated for possible recurrence.   IR consulted for neck lymph node biopsy at the request of Dr. Julien Nordmann.  Case reviewed and approved by Dr. Barbie Banner.   Patient presents for procedure today. He last ate at 6:30AM.  He does not take blood thinners.   Past Medical History:  Diagnosis Date  . 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 Cancer ([place])    lung cancer  . Coronary artery calcification seen on computed tomography June 2016   CT scan of chest: Coronary artery calcifications are noted  . Dyspnea   . ED (erectile dysfunction)   . Emphysema lung ([place])   . Essential hypertension   . Insomnia   . Lipoma   . Osteoarthritis   . Restless legs syndrome   . Thoracic aortic atherosclerosis [facility]) June 2016   CT scan chest: Atherosclerosis of thoracic aorta is noted without aneurysm or dissection. Visualized portion of upper abdomen demonstrates stable calcified splenic artery and right renal    Past Surgical History:  Procedure Laterality Date  . CATARACT EXTRACTION    . COLONOSCOPY    . PILONIDAL CYST EXCISION    . VIDEO ASSISTED THORACOSCOPY (VATS)/WEDGE RESECTION Right 01/30/2015   Procedure: Right VIDEO ASSISTED THORACOSCOPY, Right upper Lobectomy with lymph node dissection and ONQ insertion;  Surgeon: [name], MD;  Location: [place];  Service: Thoracic;  Laterality: Right;  [name] VIDEO BRONCHOSCOPY N/A 01/30/2015   Procedure: VIDEO BRONCHOSCOPY;  Surgeon: [name], MD;  Location: [place];  Service: Thoracic;  Laterality: N/A;  . VIDEO  BRONCHOSCOPY WITH ENDOBRONCHIAL ULTRASOUND N/A 07/08/2016   Procedure: VIDEO BRONCHOSCOPY WITH ENDOBRONCHIAL ULTRASOUND;  Surgeon: [name], MD;  Location: [facility] OR;  Service: Thoracic;  Laterality: N/A;    Allergies: Amlodipine; Benicar [olmesartan]; and Nisoldipine  Medications: Prior to Admission medications   Medication Sig Start Date End Date Taking? Authorizing Provider  acetaminophen (TYLENOL) 500 MG tablet Take 1,000 mg by mouth every 4 (four) hours as needed for moderate pain or headache.    Yes Historical Provider, MD  diltiazem (CARDIZEM CD) 180 MG 24 hr capsule Take 180 mg by mouth every morning.  11/17/12  Yes Historical Provider, MD  ferrous sulfate 325 (65 FE) MG EC tablet Take 325 mg by mouth daily.   Yes Historical Provider, MD  folic acid (FOLVITE) 1 MG tablet Take 1 tablet (1 mg total) by mouth daily. 04/02/16  Yes Curt Bears, MD  gabapentin (NEURONTIN) 300 MG capsule Take 600 mg by mouth at bedtime.  04/23/11  Yes Historical Provider, MD  lisinopril (PRINIVIL,ZESTRIL) 20 MG tablet Take 20 mg by mouth every morning.  04/23/11  Yes Historical Provider, MD  meloxicam (MOBIC) 15 MG tablet Take 15 mg by mouth daily.   Yes Historical Provider, MD  metoprolol succinate (TOPROL-XL) 100 MG 24 hr tablet Take 100 mg by mouth every morning.  07/31/11  Yes Historical Provider, MD  Multiple Vitamin (MULTIVITAMIN) capsule Take 1 capsule by mouth every morning.    Yes Historical Provider, MD  Omega-3 Fatty Acids ([place]  OIL) 1000 MG CAPS Take 1,000 mg by mouth daily.   Yes Historical Provider, MD  Turmeric 500 MG CAPS Take 1 capsule by mouth daily.   Yes Historical Provider, MD  vitamin C (ASCORBIC ACID) 500 MG tablet Take 500 mg by mouth daily.   Yes Historical Provider, MD  aspirin EC 81 MG tablet Take 81 mg by mouth every morning.     Historical Provider, MD  clobetasol ointment (TEMOVATE) 0.05 % Clobetasol Propionate 0.05 % External Ointment Apply to rash, and rub in well, twice a  day as needed.  Quantity: 1;  Refills: 2   Wilson M.D., [employer] ;  Start 24-Apr-2010 Active 30 GM Tube 04/24/10   Historical Provider, MD  HYDROcodone-homatropine (HYCODAN) 5-1.5 MG/5ML syrup Take 5 mLs by mouth every 6 (six) hours as needed for cough. 07/03/16   Curt Bears, MD  sildenafil (VIAGRA) 100 MG tablet Take 100 mg by mouth daily as needed for erectile dysfunction.  05/10/14   Historical Provider, MD  vardenafil (LEVITRA) 20 MG tablet Take 20 mg by mouth daily as needed for erectile dysfunction.  05/10/14   Historical Provider, MD     Family History  Problem Relation Age of Onset  . Lung cancer Mother     Social History   Social History  . Marital status: Married    Spouse name: N/A  . Number of children: N/A  . Years of education: N/A   Social History Main Topics  . Smoking status: Former Smoker    Packs/day: 1.00    Years: 39.00    Types: Cigarettes    Quit date: 06/30/1997  . Smokeless tobacco: Never Used     Comment: SMOKED PIPES ALSO  . Alcohol use 12.6 oz/week    21 Cans of beer per week     Comment: 3-4 BEERS PER DAY  . Drug use: No  . Sexual activity: Not Asked   Other Topics Concern  . None   Social History Narrative  . None    Review of Systems  Constitutional: Positive for fatigue. Negative for fever.  Respiratory: Positive for cough (chronic, occasional). Negative for shortness of breath.   Cardiovascular: Negative for chest pain.  Gastrointestinal: Negative for abdominal pain.  Psychiatric/Behavioral: Negative for behavioral problems and confusion.    Vital Signs: BP (!) 143/77 (BP Location: Right Arm)   Pulse 89   Temp 98.1 F (36.7 C) (Oral)   Resp 18   SpO2 96%   Physical Exam  Constitutional: He is oriented to person, place, and time. He appears well-developed.  Neck:  Affected lymph node not palpated.  Cardiovascular: Normal rate, regular rhythm and normal heart sounds.   Pulmonary/Chest: Effort normal and breath sounds normal.  No respiratory distress.  Neurological: He is alert and oriented to person, place, and time.  Skin: Skin is warm and dry.  Psychiatric: He has a normal mood and affect. His behavior is normal. Judgment and thought content normal.  Nursing note and vitals reviewed.   Mallampati Score:  MD Evaluation Airway: WNL Heart: WNL Abdomen: WNL Chest/ Lungs: WNL ASA  Classification: 3 Mallampati/Airway Score: One  Imaging: Dg Eye Foreign Body  Result Date: 07/10/2016 CLINICAL DATA:  Metal working/exposure; clearance prior to MRI EXAM: ORBITS FOR FOREIGN BODY - 2 VIEW COMPARISON:  None. FINDINGS: There is no evidence of metallic foreign body within the orbits. No significant bone abnormality identified. IMPRESSION: No evidence of metallic foreign body within the orbits. Electronically Signed   By:  Lahoma Crocker M.D.   On: 07/10/2016 12:02   Dg Chest 2 View  Result Date: 07/08/2016 CLINICAL DATA:  76 year old hypertensive male with history of lung cancer. Preoperative exam for bronchoscopy and lung biopsy. Cough and wheezing. Subsequent encounter. EXAM: CHEST  2 VIEW COMPARISON:  07/03/2016 PET CT. 06/10/2016 chest CT. 03/29/2015 chest x-ray. FINDINGS: Post right upper lobectomy. PET CT detected worrisome adenopathy not as well delineated on present plain film exam. No pulmonary edema, infiltrate or pneumothorax. Apical pleural thickening greater on the right. Heart size within normal limits. Calcified aorta. Ankylosis portion of the thoracic spine. IMPRESSION: Post right upper lobectomy. PET CT detected worrisome adenopathy not as well delineated on present plain film exam. Electronically Signed   By: Genia Del M.D.   On: 07/08/2016 08:25   Mr Jeri Cos QI Contrast  Result Date: 07/12/2016 CLINICAL DATA:  Personal history of lung cancer. New onset pain posterior to the right orbit without impact on vision. EXAM: MRI HEAD WITHOUT AND WITH CONTRAST TECHNIQUE: Multiplanar, multiecho pulse sequences of the  brain and surrounding structures were obtained without and with intravenous contrast. CONTRAST:  67m MULTIHANCE GADOBENATE DIMEGLUMINE 529 MG/ML IV SOLN COMPARISON:  CT head without and with contrast 11/12/2014 FINDINGS: Brain: No acute infarct, hemorrhage, or mass lesion is present. Postcontrast images demonstrate no pathologic enhancement to suggest metastatic disease of the brain. Moderate generalized atrophy and white matter disease is present bilaterally. A persistent cavum septum pellucidum is incidentally noted. Ventricles are proportionate to the degree of atrophy. The brainstem and cerebellum are normal. The internal auditory canals are within normal limits. Vascular: Flow is present in the major intracranial arteries. Skull and upper cervical spine: The skullbase is within normal limits. The craniocervical junction is normal. Marrow signal is within normal limits. Midline sagittal structures are unremarkable. Sinuses/Orbits: The paranasal sinuses are clear. Bilateral lens replacements are noted. The globes and orbits are otherwise intact. There is minimal fluid in the mastoid air cells. No obstructing nasopharyngeal lesion is evident. IMPRESSION: 1. No evidence for metastatic disease to the brain. 2. Moderate atrophy and white matter disease is somewhat advanced for age. This likely reflects the sequela of chronic microvascular ischemia. 3. Minimal bilateral mastoid effusions. No obstructing nasopharyngeal lesion is present. Electronically Signed   By: CSan MorelleM.D.   On: 07/12/2016 09:48   Nm Pet Image Restag (ps) Skull Base To Thigh  Result Date: 07/03/2016 CLINICAL DATA:  Subsequent treatment strategy for lung cancer. EXAM: NUCLEAR MEDICINE PET SKULL BASE TO THIGH TECHNIQUE: 8.4 mCi F-18 FDG was injected intravenously. Full-ring PET imaging was performed from the skull base to thigh after the radiotracer. CT data was obtained and used for attenuation correction and anatomic localization.  FASTING BLOOD GLUCOSE:  Value: 121 mg/dl COMPARISON:  12/06/2014 FINDINGS: NECK Hypermetabolic right supraclavicular lymph node is new from previous exam. This measures 8 mm and has an SUV max equal to 6.73, image 50 of series 4. CHEST New hypermetabolic bilateral mediastinal and right hilar lymph nodes identified. Index left pre-vascular lymph node measures 8 mm and has an SUV max equal to 7.73, image 60 of series 4. Hypermetabolic right paratracheal lymph node measures 1.3 cm and has an SUV max equal to 6.9, image 68 of series 4. Hypermetabolic sub- carinal lymph node measures 1.6 cm and has an SUV max equal to 11.27. Posterior mediastinal lymph node between to the left mainstem bronchus and esophagus measures 1.1 cm and has an SUV max equal to 8.01, image  83 of series 4. Hypermetabolic right hilar node measures 1.8 cm and has an SUV max equal to 8.6, image 78 of series 4. Status post right upper lobectomy. No suspicious pulmonary nodules on the CT scan. ABDOMEN/PELVIS No abnormal hypermetabolic activity within the liver, pancreas, adrenal glands, or spleen. No hypermetabolic lymph nodes in the abdomen or pelvis. SKELETON Subtle focus of increased uptake within the spinous process of T1 has an SUV max equal to 3.87. Additional subtle focus of mild increased uptake within the right iliac bone has an SUV max equal to 3.89. Within the intertrochanteric region of the proximal right femur there is a subtle focus of increased uptake within SUV max equal to 3.44. IMPRESSION: 1. Examination is positive for recurrent hypermetabolic tumor. There are new hypermetabolic right supraclavicular, bilateral mediastinal and right hilar lymph nodes compatible with metastatic adenopathy. 2. Several new foci of subtle increased uptake within the axial and appendicular skeleton worrisome for bone metastases. Electronically Signed   By: Kerby Moors M.D.   On: 07/03/2016 09:16    Labs:  CBC:  Recent Labs  12/17/15 1010  06/10/16 1001 07/08/16 0855 07/23/16 1127  WBC 4.1 6.8 7.2 7.2  HGB 12.4* 13.2 13.1 12.0*  HCT 36.3* 40.0 37.5* 35.3*  PLT 147 199 233 262    COAGS:  Recent Labs  07/08/16 0855 07/23/16 1127  INR 1.02 0.97  APTT 26 25    BMP:  Recent Labs  12/17/15 1010 06/10/16 1001 07/08/16 0855  NA 140 141 137  K 5.8* 4.9 5.1  CL  --   --  104  CO2 '23 25 22  '$ GLUCOSE 103 93 113*  BUN 28.7* 17.2 16  CALCIUM 9.8 9.9 9.7  CREATININE 1.3 1.1 1.04  GFRNONAA  --   --  >60  GFRAA  --   --  >60    LIVER FUNCTION TESTS:  Recent Labs  12/17/15 1010 06/10/16 1001 07/08/16 0855  BILITOT 0.67 0.46 0.2*  AST '21 20 27  '$ ALT 19 13 15*  ALKPHOS 41 54 54  PROT 6.9 7.3 6.9  ALBUMIN 4.0 3.6 3.9    TUMOR MARKERS: No results for input(s): AFPTM, CEA, CA199, CHROMGRNA in the last 8760 hours.  Assessment and Plan: Patient with history of lung cancer presents for evaluation of disease recurrence.  Most recent PET scan performed 07/03/16 showed right supraclaivcular, bilateral mediastinal, and right hilar lymph adenopathy.  Dr. Barbie Banner reviewed case and approved patient for neck lymph node biopsy.  Patient last ate at 6:30 this morning.  He does not take blood thinners.  Risks and Benefits discussed with the patient including, but not limited to bleeding, infection, damage to adjacent structures or low yield requiring additional tests. All of the patient's questions were answered, patient is agreeable to proceed. Consent signed and in chart.   Thank you for this interesting consult.  I greatly enjoyed meeting Donald Delacruz and look forward to participating in their care.  A copy of this report was sent to the requesting provider on this date.  Electronically Signed: Docia Barrier 07/23/2016, 11:55 AM   I spent a total of    15 Minutes in face to face in clinical consultation, greater than 50% of which was counseling/coordinating care for neck lymphadenopathy.

## 2016-07-28 ENCOUNTER — Ambulatory Visit: Payer: Medicare Other | Admitting: Internal Medicine

## 2016-07-31 ENCOUNTER — Other Ambulatory Visit (HOSPITAL_COMMUNITY)
Admission: RE | Admit: 2016-07-31 | Discharge: 2016-07-31 | Disposition: A | Discharge status: Home / Self Care | Payer: Medicare Other | Source: Ambulatory Visit | Attending: Internal Medicine | Admitting: Internal Medicine

## 2016-07-31 ENCOUNTER — Other Ambulatory Visit: Payer: Self-pay | Admitting: Internal Medicine

## 2016-07-31 ENCOUNTER — Ambulatory Visit: Payer: Medicare Other | Admitting: Internal Medicine

## 2016-07-31 ENCOUNTER — Encounter: Payer: Self-pay | Admitting: *Deleted

## 2016-07-31 ENCOUNTER — Encounter: Payer: Self-pay | Admitting: Internal Medicine

## 2016-07-31 ENCOUNTER — Telehealth: Payer: Self-pay | Admitting: Internal Medicine

## 2016-07-31 ENCOUNTER — Ambulatory Visit (HOSPITAL_BASED_OUTPATIENT_CLINIC_OR_DEPARTMENT_OTHER): Payer: Medicare Other | Admitting: Internal Medicine

## 2016-07-31 VITALS — BP 139/77 | HR 107 | Temp 97.8°F | Resp 18 | Ht 70.0 in | Wt 162.5 lb

## 2016-07-31 DIAGNOSIS — I10 Essential (primary) hypertension: Secondary | ICD-10-CM | POA: Diagnosis not present

## 2016-07-31 DIAGNOSIS — Z7189 Other specified counseling: Secondary | ICD-10-CM

## 2016-07-31 DIAGNOSIS — C3490 Malignant neoplasm of unspecified part of unspecified bronchus or lung: Secondary | ICD-10-CM | POA: Diagnosis not present

## 2016-07-31 DIAGNOSIS — D539 Nutritional anemia, unspecified: Secondary | ICD-10-CM | POA: Diagnosis not present

## 2016-07-31 DIAGNOSIS — C778 Secondary and unspecified malignant neoplasm of lymph nodes of multiple regions: Secondary | ICD-10-CM | POA: Diagnosis present

## 2016-07-31 DIAGNOSIS — Z5111 Encounter for antineoplastic chemotherapy: Secondary | ICD-10-CM

## 2016-07-31 DIAGNOSIS — G893 Neoplasm related pain (acute) (chronic): Secondary | ICD-10-CM | POA: Diagnosis not present

## 2016-07-31 DIAGNOSIS — C3410 Malignant neoplasm of upper lobe, unspecified bronchus or lung: Secondary | ICD-10-CM

## 2016-07-31 DIAGNOSIS — C3411 Malignant neoplasm of upper lobe, right bronchus or lung: Secondary | ICD-10-CM

## 2016-07-31 HISTORY — DX: Neoplasm related pain (acute) (chronic): G89.3

## 2016-07-31 HISTORY — DX: Other specified counseling: Z71.89

## 2016-07-31 HISTORY — DX: Encounter for antineoplastic chemotherapy: Z51.11

## 2016-07-31 MED ORDER — FOLIC ACID 1 MG PO TABS: 1.0000 mg | ORAL_TABLET | Freq: Every day | ORAL | 2 refills | Status: AC

## 2016-07-31 MED ORDER — CYANOCOBALAMIN 1000 MCG/ML IJ SOLN
1000.0000 ug | Freq: Once | INTRAMUSCULAR | Status: AC
  Administered 2016-07-31: 1000 ug via INTRAMUSCULAR

## 2016-07-31 MED ORDER — OXYCODONE-ACETAMINOPHEN 5-325 MG PO TABS: 1.0000 | ORAL_TABLET | Freq: Four times a day (QID) | ORAL | 0 refills | Status: DC | PRN

## 2016-07-31 MED ORDER — CYANOCOBALAMIN 1000 MCG/ML IJ SOLN
INTRAMUSCULAR | Status: AC
  Filled 2016-07-31: qty 1

## 2016-07-31 MED ORDER — PROCHLORPERAZINE MALEATE 10 MG PO TABS: 10.0000 mg | ORAL_TABLET | Freq: Four times a day (QID) | ORAL | 0 refills | Status: DC | PRN

## 2016-07-31 MED ORDER — DEXAMETHASONE 4 MG PO TABS: ORAL_TABLET | ORAL | 0 refills | Status: DC

## 2016-07-31 NOTE — Progress Notes (Signed)
Oncology Nurse Navigator Documentation  Oncology Nurse Navigator Flowsheets 07/31/2016  Navigator Location [facility]  Navigator Encounter Type Other/per Dr. Worthy Flank request, I contacted [facility] to send tissue obtained on 07/23/16 for molecular testing (foundation one) and PDL 1  Barriers/Navigation Needs Coordination of Care  Interventions Coordination of Care  Coordination of Care Other  Acuity Level 2  Acuity Level 2 Other  Time Spent with Patient 15

## 2016-07-31 NOTE — Progress Notes (Signed)
[place] Telephone:([phone]) [phone]   Fax:(336) [place], MD [address] [place] [address]  DIAGNOSIS: Recurrent non-small cell lung cancer initially diagnosed as stage IB (T2a, N0, M0) moderately to poorly differentiated adenocarcinoma involving the right upper lobe diagnosed in August 2016 with disease recurrence in December 2017.  PRIOR THERAPY: 1) Status post right upper lobectomy with lymph node dissection on 01/30/2015. The Myriad lung cancer prognostic test showed high risk prognostic score of 31 and 25% risk of dying from lung cancer in 5 years. 2) Adjuvant systemic chemotherapy with carboplatin for AUC of 5 and Alimta 500 MG/M2 every 3 weeks. First dose 03/22/2015. Status post 4 cycles.  CURRENT THERAPY: Systemic chemotherapy with carboplatin for AUC of 5, Alimta 500 MG/M2 and Avastin 15 MG/KG every 3 weeks. First dose 08/13/2016.  INTERVAL HISTORY: Donald Delacruz 76 y.o. male returns to the clinic today for follow-up visit accompanied by his wife and son, [name] who lives in [place]. The wife also has dementia. The patient came to the clinic today for evaluation and recommendation regarding his condition after repeat biopsy of the right supraclavicular lymph node was consistent with recurrent adenocarcinoma. He is complaining of several issues including lack of energy, lack of appetite, cough and upper respiratory rhonchi as well as low-grade fever and night sweats. He also has some aching pain in the lower extremities and pain in the central part of the chest more at nighttime. He takes only Tylenol for pain management with some relief. He denied having any nausea, vomiting, diarrhea or constipation. He denied having any headache or visual changes. He had MRI of the brain performed recently that showed no evidence for metastatic disease to the brain. He has no shortness breath except with exertion and  no hemoptysis.  MEDICAL HISTORY: Past Medical History:  Diagnosis Date  . 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 Cancer ([place])    lung cancer  . Coronary artery calcification seen on computed tomography June 2016   CT scan of chest: Coronary artery calcifications are noted  . Dyspnea   . ED (erectile dysfunction)   . Emphysema lung ([place])   . Essential hypertension   . Insomnia   . Lipoma   . Osteoarthritis   . Restless legs syndrome   . Thoracic aortic atherosclerosis [facility]) June 2016   CT scan chest: Atherosclerosis of thoracic aorta is noted without aneurysm or dissection. Visualized portion of upper abdomen demonstrates stable calcified splenic artery and right renal    ALLERGIES:  is allergic to amlodipine; benicar [olmesartan]; and nisoldipine.  MEDICATIONS:  Current Outpatient Prescriptions  Medication Sig Dispense Refill  . acetaminophen (TYLENOL) 500 MG tablet Take 1,000 mg by mouth every 4 (four) hours as needed for moderate pain or headache.     [name] aspirin EC 81 MG tablet Take 81 mg by mouth every morning.     . clobetasol ointment (TEMOVATE) 0.05 % Clobetasol Propionate 0.05 % External Ointment Apply to rash, and rub in well, twice a day as needed.  Quantity: 1;  Refills: 2   Wilson M.D., [employer] ;  Start 24-Apr-2010 Active 30 GM Tube    . diltiazem (CARDIZEM CD) 180 MG 24 hr capsule Take 180 mg by mouth every morning.     . ferrous sulfate 325 (65 FE) MG EC tablet Take  325 mg by mouth daily.    . folic acid (FOLVITE) 1 MG tablet Take 1 tablet (1 mg total) by mouth daily. 30 tablet 2  . gabapentin (NEURONTIN) 300 MG capsule Take 600 mg by mouth at bedtime.     [name] HYDROcodone-homatropine (HYCODAN) 5-1.5 MG/5ML syrup Take 5 mLs by mouth every 6 (six) hours as needed for cough. 120 mL 0  . lisinopril (PRINIVIL,ZESTRIL) 20 MG tablet Take 20 mg by mouth every morning.     . meloxicam (MOBIC) 15 MG tablet Take  15 mg by mouth daily.    . metoprolol succinate (TOPROL-XL) 100 MG 24 hr tablet Take 100 mg by mouth every morning.     . Multiple Vitamin (MULTIVITAMIN) capsule Take 1 capsule by mouth every morning.     . Omega-3 Fatty Acids (FISH OIL) 1000 MG CAPS Take 1,000 mg by mouth daily.    . sildenafil (VIAGRA) 100 MG tablet Take 100 mg by mouth daily as needed for erectile dysfunction.     . Turmeric 500 MG CAPS Take 1 capsule by mouth daily.    . vardenafil (LEVITRA) 20 MG tablet Take 20 mg by mouth daily as needed for erectile dysfunction.     . vitamin C (ASCORBIC ACID) 500 MG tablet Take 500 mg by mouth daily.     No current facility-administered medications for this visit.     SURGICAL HISTORY:  Past Surgical History:  Procedure Laterality Date  . CATARACT EXTRACTION    . COLONOSCOPY    . PILONIDAL CYST EXCISION    . VIDEO ASSISTED THORACOSCOPY (VATS)/WEDGE RESECTION Right 01/30/2015   Procedure: Right VIDEO ASSISTED THORACOSCOPY, Right upper Lobectomy with lymph node dissection and ONQ insertion;  Surgeon: [name], MD;  Location: [facility];  Service: Thoracic;  Laterality: Right;  [name] VIDEO BRONCHOSCOPY N/A 01/30/2015   Procedure: VIDEO BRONCHOSCOPY;  Surgeon: [name], MD;  Location: [facility];  Service: Thoracic;  Laterality: N/A;  . VIDEO BRONCHOSCOPY WITH ENDOBRONCHIAL ULTRASOUND N/A 07/08/2016   Procedure: VIDEO BRONCHOSCOPY WITH ENDOBRONCHIAL ULTRASOUND;  Surgeon: [name], MD;  Location: [facility];  Service: Thoracic;  Laterality: N/A;    REVIEW OF SYSTEMS:  Constitutional: positive for anorexia, fatigue, fevers, sweats and weight loss Eyes: negative Ears, nose, mouth, throat, and face: negative Respiratory: positive for cough, dyspnea on exertion, pleurisy/chest pain and wheezing Cardiovascular: negative Gastrointestinal: negative Genitourinary:negative Integument/breast: negative Hematologic/lymphatic: negative Musculoskeletal:positive for  arthralgias Neurological: negative Behavioral/Psych: negative Endocrine: negative Allergic/Immunologic: negative   PHYSICAL EXAMINATION: General appearance: alert, cooperative, fatigued and no distress Head: Normocephalic, without obvious abnormality, atraumatic Neck: no adenopathy, no JVD, supple, symmetrical, trachea midline and thyroid not enlarged, symmetric, no tenderness/mass/nodules Lymph nodes: Cervical, supraclavicular, and axillary nodes normal. Resp: rhonchi bilaterally and wheezes bilaterally Back: symmetric, no curvature. ROM normal. No CVA tenderness. Cardio: regular rate and rhythm, S1, S2 normal, no murmur, click, rub or gallop GI: soft, non-tender; bowel sounds normal; no masses,  no organomegaly Extremities: extremities normal, atraumatic, no cyanosis or edema Neurologic: Alert and oriented X 3, normal strength and tone. Normal symmetric reflexes. Normal coordination and gait  ECOG PERFORMANCE STATUS: 1 - Symptomatic but completely ambulatory  Blood pressure 139/77, pulse (!) 107, temperature 97.8 F (36.6 C), temperature source Oral, resp. rate 18, height 5' 10"  (1.778 m), weight 162 lb 8 oz (73.7 kg), SpO2 97 %.  LABORATORY DATA: Lab Results  Component Value Date   WBC 7.2 07/23/2016   HGB 12.0 (L) 07/23/2016  HCT 35.3 (L) 07/23/2016   MCV 92.4 07/23/2016   PLT 262 07/23/2016      Chemistry      Component Value Date/Time   NA 137 07/08/2016 0855   NA 141 06/10/2016 1001   K 5.1 07/08/2016 0855   K 4.9 06/10/2016 1001   CL 104 07/08/2016 0855   CO2 22 07/08/2016 0855   CO2 25 06/10/2016 1001   BUN 16 07/08/2016 0855   BUN 17.2 06/10/2016 1001   CREATININE 1.04 07/08/2016 0855   CREATININE 1.1 06/10/2016 1001      Component Value Date/Time   CALCIUM 9.7 07/08/2016 0855   CALCIUM 9.9 06/10/2016 1001   ALKPHOS 54 07/08/2016 0855   ALKPHOS 54 06/10/2016 1001   AST 27 07/08/2016 0855   AST 20 06/10/2016 1001   ALT 15 (L) 07/08/2016 0855   ALT 13  06/10/2016 1001   BILITOT 0.2 (L) 07/08/2016 0855   BILITOT 0.46 06/10/2016 1001       RADIOGRAPHIC STUDIES: Dg Eye Foreign Body  Result Date: 07/10/2016 CLINICAL DATA:  Metal working/exposure; clearance prior to MRI EXAM: ORBITS FOR FOREIGN BODY - 2 VIEW COMPARISON:  None. FINDINGS: There is no evidence of metallic foreign body within the orbits. No significant bone abnormality identified. IMPRESSION: No evidence of metallic foreign body within the orbits. Electronically Signed   By: Lahoma Crocker M.D.   On: 07/10/2016 12:02   Dg Chest 2 View  Result Date: 07/08/2016 CLINICAL DATA:  76 year old hypertensive male with history of lung cancer. Preoperative exam for bronchoscopy and lung biopsy. Cough and wheezing. Subsequent encounter. EXAM: CHEST  2 VIEW COMPARISON:  07/03/2016 PET CT. 06/10/2016 chest CT. 03/29/2015 chest x-ray. FINDINGS: Post right upper lobectomy. PET CT detected worrisome adenopathy not as well delineated on present plain film exam. No pulmonary edema, infiltrate or pneumothorax. Apical pleural thickening greater on the right. Heart size within normal limits. Calcified aorta. Ankylosis portion of the thoracic spine. IMPRESSION: Post right upper lobectomy. PET CT detected worrisome adenopathy not as well delineated on present plain film exam. Electronically Signed   By: Genia Del M.D.   On: 07/08/2016 08:25   Mr Jeri Cos WE Contrast  Result Date: 07/12/2016 CLINICAL DATA:  Personal history of lung cancer. New onset pain posterior to the right orbit without impact on vision. EXAM: MRI HEAD WITHOUT AND WITH CONTRAST TECHNIQUE: Multiplanar, multiecho pulse sequences of the brain and surrounding structures were obtained without and with intravenous contrast. CONTRAST:  62m MULTIHANCE GADOBENATE DIMEGLUMINE 529 MG/ML IV SOLN COMPARISON:  CT head without and with contrast 11/12/2014 FINDINGS: Brain: No acute infarct, hemorrhage, or mass lesion is present. Postcontrast images  demonstrate no pathologic enhancement to suggest metastatic disease of the brain. Moderate generalized atrophy and white matter disease is present bilaterally. A persistent cavum septum pellucidum is incidentally noted. Ventricles are proportionate to the degree of atrophy. The brainstem and cerebellum are normal. The internal auditory canals are within normal limits. Vascular: Flow is present in the major intracranial arteries. Skull and upper cervical spine: The skullbase is within normal limits. The craniocervical junction is normal. Marrow signal is within normal limits. Midline sagittal structures are unremarkable. Sinuses/Orbits: The paranasal sinuses are clear. Bilateral lens replacements are noted. The globes and orbits are otherwise intact. There is minimal fluid in the mastoid air cells. No obstructing nasopharyngeal lesion is evident. IMPRESSION: 1. No evidence for metastatic disease to the brain. 2. Moderate atrophy and white matter disease is somewhat advanced for age.  This likely reflects the sequela of chronic microvascular ischemia. 3. Minimal bilateral mastoid effusions. No obstructing nasopharyngeal lesion is present. Electronically Signed   By: San Morelle M.D.   On: 07/12/2016 09:48   Nm Pet Image Restag (ps) Skull Base To Thigh  Result Date: 07/03/2016 CLINICAL DATA:  Subsequent treatment strategy for lung cancer. EXAM: NUCLEAR MEDICINE PET SKULL BASE TO THIGH TECHNIQUE: 8.4 mCi F-18 FDG was injected intravenously. Full-ring PET imaging was performed from the skull base to thigh after the radiotracer. CT data was obtained and used for attenuation correction and anatomic localization. FASTING BLOOD GLUCOSE:  Value: 121 mg/dl COMPARISON:  12/06/2014 FINDINGS: NECK Hypermetabolic right supraclavicular lymph node is new from previous exam. This measures 8 mm and has an SUV max equal to 6.73, image 50 of series 4. CHEST New hypermetabolic bilateral mediastinal and right hilar lymph nodes  identified. Index left pre-vascular lymph node measures 8 mm and has an SUV max equal to 7.73, image 60 of series 4. Hypermetabolic right paratracheal lymph node measures 1.3 cm and has an SUV max equal to 6.9, image 68 of series 4. Hypermetabolic sub- carinal lymph node measures 1.6 cm and has an SUV max equal to 11.27. Posterior mediastinal lymph node between to the left mainstem bronchus and esophagus measures 1.1 cm and has an SUV max equal to 8.01, image 83 of series 4. Hypermetabolic right hilar node measures 1.8 cm and has an SUV max equal to 8.6, image 78 of series 4. Status post right upper lobectomy. No suspicious pulmonary nodules on the CT scan. ABDOMEN/PELVIS No abnormal hypermetabolic activity within the liver, pancreas, adrenal glands, or spleen. No hypermetabolic lymph nodes in the abdomen or pelvis. SKELETON Subtle focus of increased uptake within the spinous process of T1 has an SUV max equal to 3.87. Additional subtle focus of mild increased uptake within the right iliac bone has an SUV max equal to 3.89. Within the intertrochanteric region of the proximal right femur there is a subtle focus of increased uptake within SUV max equal to 3.44. IMPRESSION: 1. Examination is positive for recurrent hypermetabolic tumor. There are new hypermetabolic right supraclavicular, bilateral mediastinal and right hilar lymph nodes compatible with metastatic adenopathy. 2. Several new foci of subtle increased uptake within the axial and appendicular skeleton worrisome for bone metastases. Electronically Signed   By: Kerby Moors M.D.   On: 07/03/2016 09:16   US Biopsy  Result Date: 07/23/2016 INDICATION: Lung cancer. EXAM: ULTRASOUND-GUIDED BIOPSY RIGHT NECK LYMPH NODE CORE BIOPSY MEDICATIONS: None. ANESTHESIA/SEDATION: Fentanyl 100 mcg IV; Versed 2 mg IV Moderate Sedation Time:  10 The patient was continuously monitored during the procedure by the interventional radiology nurse under my direct supervision.  FLUOROSCOPY TIME:  Fluoroscopy Time:  minutes  seconds ( mGy). COMPLICATIONS: None immediate. PROCEDURE: Informed written consent was obtained from the patient after a thorough discussion of the procedural risks, benefits and alternatives. All questions were addressed. Maximal Sterile Barrier Technique was utilized including caps, mask, sterile gowns, sterile gloves, sterile drape, hand hygiene and skin antiseptic. A timeout was performed prior to the initiation of the procedure. The right neck was prepped with ChloraPrep in a sterile fashion, and a sterile drape was applied covering the operative field. A sterile gown and sterile gloves were used for the procedure. Under sonographic guidance, 3 18 gauge core biopsies of an enlarged right cervical lymph node were obtained. Final imaging was performed. Patient tolerated the procedure well without complication. Vital sign monitoring by nursing staff  during the procedure will continue as patient is in the special procedures unit for post procedure observation. FINDINGS: The images document guide needle placement within the enlarged right cervical lymph node. Post biopsy images demonstrate no hemorrhage. IMPRESSION: Successful ultrasound-guided core biopsy of an enlarged right neck lymph node. Electronically Signed   By: Marybelle Killings M.D.   On: 07/23/2016 14:12    ASSESSMENT AND PLAN: This is a very pleasant 76 years old white male with: 1) recurrent non-small cell lung cancer, adenocarcinoma initially diagnosed as stage IB in August 2016 and presenting with right upper lobe lung nodule is status post right upper lobectomy with lymph node dissection followed by 4 cycles of adjuvant systemic chemotherapy with carboplatin and Alimta. Unfortunately the recent staging workup showed evidence for disease recurrence with mediastinal and supraclavicular lymphadenopathy as well as metastatic bone lesions. I had a lengthy discussion with the patient and his family. I  discussed with him the goals of care. The patient understands that he has an incurable condition and on the treatment options will be off palliative nature. I gave him the option of palliative care and hospice referral versus consideration of systemic chemotherapy with carboplatin, Alimta and Avastin versus treatment with immunotherapy or target therapy if he has the appropriate markers. I will send his tissue block to be tested for molecular mutations as well as PDL 1 expression. The patient is interested in proceeding with the treatment as soon as possible. I will arrange for him to start the first cycle of systemic chemotherapy with carboplatin, Alimta and Avastin on 08/13/2016 unless PDL 1 expression is over 50% he will be treated with single agent Ketruda (pembrolizumab). If the molecular studies showed any actionable mutations, he may also be considered for targeted therapy. I discussed with the patient adverse effects of this treatment including but not limited to alopecia, myelosuppression, nausea and vomiting, peripheral neuropathy, liver or renal dysfunction in addition to the adverse effect of his Avastin including risk of pulmonary hemorrhage, GI perforation as well as hypertension and proteinuria. We will arrange for the patient to receive vitamin B 12 injection today. The patient would also receive prescription for Compazine 10 mg by mouth every 6 hours as needed for nausea, Decadron 4 mg by mouth twice a day, the day before, day of and day after the chemotherapy in addition to folic acid 1 mg by mouth daily. She will come back for follow-up visit in 3 weeks for evaluation and management of any adverse effect of his treatment after starting the first dose of chemotherapy. 2) for the weight loss: I recommended for the patient to see the dietitian at the [facility] but he would like to hold in this option for now. He was given nutritional supplements from the cancer center today. 3) for pain  management especially the substernal pain at night time which is most likely secondary to his mediastinal tumor, I gave the patient prescription for Percocet 5/325 mg by mouth every 6 hours as needed. 4) for hypertension, the patient will continue his current treatment with Cardizem, metoprolol and lisinopril. 5) for the anemia, he will continue his treatment with ferrous sulfate. The patient was advised to call immediately if he has any concerning symptoms in the interval. The patient voices understanding of current disease status and treatment options and is in agreement with the current care plan.  All questions were answered. The patient knows to call the clinic with any problems, questions or concerns. We can certainly see the  patient much sooner if necessary.  I spent 30 minutes counseling the patient face to face. The total time spent in the appointment was 40 minutes.  Disclaimer: This note was dictated with voice recognition software. Similar sounding words can inadvertently be transcribed and may not be corrected upon review.

## 2016-07-31 NOTE — Telephone Encounter (Signed)
Labs, follow up and chemo scheduled per 07/31/16 los. Patient was given a copy of the AVS report and appointment schedule per 07/31/16 los.

## 2016-07-31 NOTE — Progress Notes (Signed)
START ON PATHWAY REGIMEN - Non-Small Cell Lung  [id number]: Carboplatin AUC=5 + Pemetrexed 500 mg/m2 + Bevacizumab 15 mg/kg q21 Days x 4 Cycles   A cycle is every 21 days:     Carboplatin (Paraplatin(R)) AUC=5 in 250 mL NS IV over 1 hour Dose Mod: None     Pemetrexed (Alimta(R)) 500 mg/m2 in 100 mL NS IV over 10 minutes, manufacturer recommends not administering to patients with CrCl < 45 mL/min Dose Mod: None     Bevacizumab (Avastin(R)) 15 mg/kg in 100 mL NS IV over 90 minutes first infusion, 60 minutes second infusion and 30 minutes all subsequent infusions if tolerated Dose Mod: None Additional Orders: * All AUC calculations intended to be used in [employer] formula Note: Patient to receive the following prior to the initiation of therapy: 1) Dexamethasone 4 mg orally twice daily x 6 doses.  First dose 24 hours before chemotherapy. 2) Folic acid >= 156 mcg orally daily.  First dose at least 5 days prior to the first dose of pemetrexed. 3) Vitamin B12 1,000 mcg intramuscularly every 9 weeks.  First dose at least 5 days prior to the first dose of pemetrexed.  **Always confirm dose/schedule in your pharmacy ordering system**    Patient Characteristics: Stage IV Metastatic, Non Squamous, Initial Chemotherapy/Immunotherapy, PS = 0, 1, PD-L1 Expression Positive 1-49% (TPS) / Negative / Not Tested / Not a Candidate for Immunotherapy AJCC T Category: T2a Current Disease Status: Distant Metastases AJCC N Category: N3 AJCC M Category: M1c AJCC 8 Stage Grouping: IVB Histology: Non Squamous Cell ROS1 Rearrangement Status: Awaiting Test Results T790M Mutation Status: Not Applicable - EGFR Mutation Negative/Unknown Other Mutations/Biomarkers: No Other Actionable Mutations PD-L1 Expression Status: Test Not Ordered Chemotherapy/Immunotherapy LOT: Initial Chemotherapy/Immunotherapy Molecular Targeted Therapy: Not Appropriate ALK Translocation Status: Awaiting Test Results Would you be surprised if  this patient died  in the next year? I would NOT be surprised if this patient died in the next year EGFR Mutation Status: Awaiting Test Results BRAF [id number] Mutation Status: Awaiting Test Results Performance Status: PS = 0, 1  Intent of Therapy: Non-Curative / Palliative Intent, Discussed with Patient

## 2016-08-06 ENCOUNTER — Ambulatory Visit: Payer: Medicare Other

## 2016-08-06 ENCOUNTER — Encounter (HOSPITAL_COMMUNITY): Payer: Self-pay

## 2016-08-06 ENCOUNTER — Encounter: Payer: Self-pay | Admitting: Internal Medicine

## 2016-08-06 ENCOUNTER — Telehealth: Payer: Self-pay | Admitting: Medical Oncology

## 2016-08-06 NOTE — Telephone Encounter (Addendum)
Per [facility] appt with [facility]. Son notified. Nausea is better , took 2 alleve for pain. Per his local pharmacist he will try percocet again tonight and eat food with it.

## 2016-08-06 NOTE — Telephone Encounter (Signed)
Son called back about email . I recommended he take pt to ED per [facility]. Son does not think pt is at that point yet. I gave him  [facility] appt tomorrow .

## 2016-08-07 ENCOUNTER — Other Ambulatory Visit: Payer: Self-pay | Admitting: Nurse Practitioner

## 2016-08-07 ENCOUNTER — Encounter: Payer: Self-pay | Admitting: Nurse Practitioner

## 2016-08-07 ENCOUNTER — Encounter: Payer: Self-pay | Admitting: Radiation Oncology

## 2016-08-07 ENCOUNTER — Telehealth: Payer: Self-pay | Admitting: Nurse Practitioner

## 2016-08-07 ENCOUNTER — Other Ambulatory Visit (HOSPITAL_BASED_OUTPATIENT_CLINIC_OR_DEPARTMENT_OTHER): Payer: Medicare Other

## 2016-08-07 ENCOUNTER — Ambulatory Visit (HOSPITAL_BASED_OUTPATIENT_CLINIC_OR_DEPARTMENT_OTHER): Payer: Medicare Other

## 2016-08-07 ENCOUNTER — Other Ambulatory Visit: Payer: Self-pay | Admitting: Internal Medicine

## 2016-08-07 ENCOUNTER — Ambulatory Visit (HOSPITAL_BASED_OUTPATIENT_CLINIC_OR_DEPARTMENT_OTHER): Payer: Medicare Other | Admitting: Nurse Practitioner

## 2016-08-07 VITALS — BP 128/59 | HR 91 | Temp 97.7°F | Resp 17 | Ht 70.0 in | Wt 161.3 lb

## 2016-08-07 VITALS — BP 117/69 | HR 81 | Temp 98.0°F | Resp 18

## 2016-08-07 DIAGNOSIS — Z5111 Encounter for antineoplastic chemotherapy: Secondary | ICD-10-CM

## 2016-08-07 DIAGNOSIS — R062 Wheezing: Secondary | ICD-10-CM

## 2016-08-07 DIAGNOSIS — C3411 Malignant neoplasm of upper lobe, right bronchus or lung: Secondary | ICD-10-CM

## 2016-08-07 DIAGNOSIS — Z5112 Encounter for antineoplastic immunotherapy: Secondary | ICD-10-CM | POA: Diagnosis not present

## 2016-08-07 DIAGNOSIS — C3410 Malignant neoplasm of upper lobe, unspecified bronchus or lung: Secondary | ICD-10-CM

## 2016-08-07 DIAGNOSIS — Z79899 Other long term (current) drug therapy: Secondary | ICD-10-CM | POA: Diagnosis not present

## 2016-08-07 DIAGNOSIS — R0602 Shortness of breath: Secondary | ICD-10-CM | POA: Diagnosis not present

## 2016-08-07 LAB — CBC WITH DIFFERENTIAL/PLATELET
BASO%: 0.3 % (ref 0.0–2.0)
Basophils Absolute: 0 10*3/uL (ref 0.0–0.1)
EOS ABS: 0 10*3/uL (ref 0.0–0.5)
EOS%: 0.3 % (ref 0.0–7.0)
HEMATOCRIT: 37.1 % — AB (ref 38.4–49.9)
HEMOGLOBIN: 11.9 g/dL — AB (ref 13.0–17.1)
LYMPH#: 0.9 10*3/uL (ref 0.9–3.3)
LYMPH%: 13.8 % — ABNORMAL LOW (ref 14.0–49.0)
MCH: 30.6 pg (ref 27.2–33.4)
MCHC: 32.1 g/dL (ref 32.0–36.0)
MCV: 95.4 fL (ref 79.3–98.0)
MONO#: 0.8 10*3/uL (ref 0.1–0.9)
MONO%: 11.7 % (ref 0.0–14.0)
NEUT%: 73.9 % (ref 39.0–75.0)
NEUTROS ABS: 5 10*3/uL (ref 1.5–6.5)
PLATELETS: 280 10*3/uL (ref 140–400)
RBC: 3.89 10*6/uL — ABNORMAL LOW (ref 4.20–5.82)
RDW: 12.8 % (ref 11.0–14.6)
WBC: 6.8 10*3/uL (ref 4.0–10.3)

## 2016-08-07 LAB — COMPREHENSIVE METABOLIC PANEL
ALBUMIN: 3.4 g/dL — AB (ref 3.5–5.0)
ALT: 23 U/L (ref 0–55)
AST: 33 U/L (ref 5–34)
Alkaline Phosphatase: 100 U/L (ref 40–150)
Anion Gap: 11 mEq/L (ref 3–11)
BUN: 22.6 mg/dL (ref 7.0–26.0)
CALCIUM: 10.2 mg/dL (ref 8.4–10.4)
CO2: 23 meq/L (ref 22–29)
CREATININE: 1.1 mg/dL (ref 0.7–1.3)
Chloride: 104 mEq/L (ref 98–109)
EGFR: 66 mL/min/{1.73_m2} — ABNORMAL LOW (ref >90)
Glucose: 115 mg/dl (ref 70–140)
Potassium: 5.1 mEq/L (ref 3.5–5.1)
SODIUM: 138 meq/L (ref 136–145)
TOTAL PROTEIN: 7.1 g/dL (ref 6.4–8.3)
Total Bilirubin: 0.42 mg/dL (ref 0.20–1.20)

## 2016-08-07 LAB — UA PROTEIN, DIPSTICK - CHCC: Protein, ur: 30 mg/dL

## 2016-08-07 MED ORDER — DEXAMETHASONE SODIUM PHOSPHATE 10 MG/ML IJ SOLN
INTRAMUSCULAR | Status: AC
  Filled 2016-08-07: qty 1

## 2016-08-07 MED ORDER — FOSAPREPITANT DIMEGLUMINE INJECTION 150 MG
Freq: Once | INTRAVENOUS | Status: AC
  Administered 2016-08-07: 14:00:00 via INTRAVENOUS
  Filled 2016-08-07: qty 5

## 2016-08-07 MED ORDER — PALONOSETRON HCL INJECTION 0.25 MG/5ML
INTRAVENOUS | Status: AC
  Filled 2016-08-07: qty 5

## 2016-08-07 MED ORDER — SODIUM CHLORIDE 0.9 % IV SOLN
Freq: Once | INTRAVENOUS | Status: AC
  Administered 2016-08-07: 14:00:00 via INTRAVENOUS

## 2016-08-07 MED ORDER — SODIUM CHLORIDE 0.9 % IV SOLN
423.0000 mg | Freq: Once | INTRAVENOUS | Status: AC
  Administered 2016-08-07: 420 mg via INTRAVENOUS
  Filled 2016-08-07: qty 42

## 2016-08-07 MED ORDER — SODIUM CHLORIDE 0.9 % IV SOLN
520.0000 mg/m2 | Freq: Once | INTRAVENOUS | Status: AC
  Administered 2016-08-07: 1000 mg via INTRAVENOUS
  Filled 2016-08-07: qty 40

## 2016-08-07 MED ORDER — PALONOSETRON HCL INJECTION 0.25 MG/5ML
0.2500 mg | Freq: Once | INTRAVENOUS | Status: AC
  Administered 2016-08-07: 0.25 mg via INTRAVENOUS

## 2016-08-07 MED ORDER — BEVACIZUMAB CHEMO INJECTION 400 MG/16ML
15.0000 mg/kg | Freq: Once | INTRAVENOUS | Status: AC
  Administered 2016-08-07: 1100 mg via INTRAVENOUS
  Filled 2016-08-07: qty 32

## 2016-08-07 MED ORDER — ALBUTEROL SULFATE HFA 108 (90 BASE) MCG/ACT IN AERS: 1.0000 | INHALATION_SPRAY | Freq: Four times a day (QID) | RESPIRATORY_TRACT | 2 refills | Status: AC | PRN

## 2016-08-07 MED ORDER — HYDROCODONE-HOMATROPINE 5-1.5 MG/5ML PO SYRP: 5.0000 mL | ORAL_SOLUTION | Freq: Four times a day (QID) | ORAL | 0 refills | Status: DC | PRN

## 2016-08-07 NOTE — Progress Notes (Signed)
SYMPTOM MANAGEMENT CLINIC    Chief Complaint: Shortness of breath, wheezing  HPI:  Donald Delacruz 76 y.o. male diagnosed with lung cancer.  Patient will initiate cycle 1 of his carboplatin/Alimta/Avastin chemotherapy regimen today.   Oncology History   Patient presented with routine CXR, then followed with scans.   Lung cancer, right upper lobe   Staging form: Lung, AJCC 7th Edition     Clinical stage from 01/08/2015: Stage IIB (T3(2), N0, M0) - Signed by [name], MD on 02/02/2015     Pathologic stage from 02/02/2015: Stage IB (T2a, N0, cM0) - Signed by [name], MD on 02/02/2015       Lung cancer, right upper lobe   11/29/2014 Imaging    CT Chest Stable 9 mm subpleural nodule is noted laterally in right lower lobe.  Cluster of nodules and ground-glass opacity seen in right upper lobe on prior exam has significantly enlarged in size, and PET scan is recommended       12/06/2014 Imaging    PET scan IMPRESSION: 1. 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 2. No evidence of thoracic nodal or extrathoracic hypermetabolic metastasis.      12/28/2014 Procedure    CT BIopsy IMPRESSION: CT-guided core biopsy of a right upper lobe lesion.      01/30/2015 Pathology Results    1. Lung, resection (segmental or lobe), Right upper lobe - INVASIVE MODERATELY TO POORLY DIFFERENTIATED ADENOCARCINOMA, SPANNING 3.8 CM IN GREATEST DIMENSION. - VISCERAL PLEURAL INVASION IS NOT IDENTIFIED. - MARGINS ARE NEGATIVE. -       01/30/2015 Surgery    SURGICAL PROCEDURE: Video bronchoscopy, right video-assisted thoracoscopy with right upper lobectomy, lymph node dissection, and placement of On-Q.      02/02/2015 Initial Diagnosis    Lung cancer, right upper lobe      03/15/2015 Imaging    CT Head FINDINGS: No evidence for acute infarction, hemorrhage, mass lesion, hydrocephalus, or extra-axial fluid.        Review of Systems    Constitutional: Positive for malaise/fatigue.  Respiratory: Positive for shortness of breath and wheezing.   All other systems reviewed and are negative.   Past Medical History:  Diagnosis Date  . 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 Cancer ([place])    lung cancer  . 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 Coronary artery calcification seen on computed tomography June 2016   CT scan of chest: Coronary artery calcifications are noted  . Dyspnea   . ED (erectile dysfunction)   . Emphysema lung ([place])   . Encounter for antineoplastic chemotherapy 07/31/2016  . Essential hypertension   . Goals of care, counseling/discussion 07/31/2016  . Insomnia   . Lipoma   . Osteoarthritis   . Restless legs syndrome   . Thoracic aortic atherosclerosis [facility]) June 2016   CT scan chest: Atherosclerosis of thoracic aorta is noted without aneurysm or dissection. Visualized portion of upper abdomen demonstrates stable calcified splenic artery and right renal    Past Surgical History:  Procedure Laterality Date  . CATARACT EXTRACTION    . COLONOSCOPY    . PILONIDAL CYST EXCISION    . VIDEO ASSISTED THORACOSCOPY (VATS)/WEDGE RESECTION Right 01/30/2015   Procedure: Right VIDEO ASSISTED THORACOSCOPY, Right upper Lobectomy with lymph node dissection and ONQ insertion;  Surgeon: [name], MD;  Location: [place];  Service: Thoracic;  Laterality: Right;  [name] VIDEO BRONCHOSCOPY N/A 01/30/2015   Procedure: VIDEO BRONCHOSCOPY;  Surgeon: [name], MD;  Location: [facility] OR;  Service: Thoracic;  Laterality: N/A;  . 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    is allergic to amlodipine; benicar [olmesartan]; and nisoldipine.  Allergies as of 08/07/2016      Reactions   Amlodipine Swelling   Benicar [olmesartan]    Dizziness,malaise   Nisoldipine Swelling      Medication List       Accurate as of 08/07/16 12:10 PM. Always use your most recent med list.          acetaminophen 500 MG tablet Commonly known as:  TYLENOL Take 1,000 mg by mouth every 4 (four) hours as needed for moderate pain or headache.   albuterol 108 (90 Base) MCG/ACT inhaler Commonly known as:  PROVENTIL HFA;VENTOLIN HFA Inhale 1-2 puffs into the lungs every 6 (six) hours as needed for wheezing or shortness of breath.   aspirin EC 81 MG tablet Take 81 mg by mouth every morning.   clobetasol ointment 0.05 % Commonly known as:  TEMOVATE Clobetasol Propionate 0.05 % External Ointment Apply to rash, and rub in well, twice a day as needed.  Quantity: 1;  Refills: 2   Wilson M.D., [employer] ;  Start 24-Apr-2010 Active 30 GM Tube   dexamethasone 4 MG tablet Commonly known as:  DECADRON 4 mg by mouth twice a day the day before, day of and day after the chemotherapy every 3 weeks   diltiazem 180 MG 24 hr capsule Commonly known as:  CARDIZEM CD Take 180 mg by mouth every morning.   ferrous sulfate 325 (65 FE) MG EC tablet Take 325 mg by mouth daily.   Fish Oil 1000 MG Caps Take 1,000 mg by mouth daily.   folic acid 1 MG tablet Commonly known as:   FOLVITE Take 1 tablet (1 mg total) by mouth daily.   gabapentin 300 MG capsule Commonly known as:  NEURONTIN Take 600 mg by mouth at bedtime.   HYDROcodone-homatropine 5-1.5 MG/5ML syrup Commonly known as:  HYCODAN Take 5 mLs by mouth every 6 (six) hours as needed for cough.   LEVITRA 20 MG tablet Generic drug:  vardenafil Take 20 mg by mouth daily as needed for erectile dysfunction.   lisinopril 20 MG tablet Commonly known as:  PRINIVIL,ZESTRIL Take 20 mg by mouth every morning.   meloxicam 15 MG tablet Commonly known as:  MOBIC Take 15 mg by mouth daily.   metoprolol succinate 100 MG 24 hr tablet Commonly known as:  TOPROL-XL Take 100 mg by mouth every morning.   multivitamin capsule Take 1 capsule by mouth every morning.   naproxen sodium 220 MG tablet Commonly known as:  ANAPROX Take 220 mg by mouth 2 (two) times daily with a  meal.   oxyCODONE-acetaminophen 5-325 MG tablet Commonly known as:  PERCOCET/ROXICET Take 1 tablet by mouth every 6 (six) hours as needed for severe pain.   prochlorperazine 10 MG tablet Commonly known as:  COMPAZINE TAKE 1 TABLET BY MOUTH EVERY 6 HOURS AS NEEDED FOR NAUSEA OR VOMITING   Turmeric 500 MG Caps Take 1 capsule by mouth daily.   VIAGRA 100 MG tablet Generic drug:  sildenafil Take 100 mg by mouth daily as needed for erectile dysfunction.   vitamin C 500 MG tablet Commonly known as:  ASCORBIC ACID Take 500 mg by mouth daily.        PHYSICAL EXAMINATION  Oncology Vitals 08/07/2016 07/31/2016  Height 178 cm 178 cm  Weight 73.165 kg 73.71 kg  Weight (lbs) 161 lbs 5 oz 162 lbs 8 oz  BMI (kg/m2) 23.14 kg/m2 23.32 kg/m2  Temp 97.7 97.8  Pulse 91 107  Resp 17 18  SpO2 99 97  BSA (m2) 1.9 m2 1.91 m2   BP Readings from Last 2 Encounters:  08/07/16 (!) 128/59  07/31/16 139/77    Physical Exam  Constitutional: He is oriented to person, place, and time. Vital signs are normal. He appears unhealthy.  HENT:  Head:  Normocephalic and atraumatic.  Mouth/Throat: Oropharynx is clear and moist.  Eyes: Conjunctivae and EOM are normal. Pupils are equal, round, and reactive to light. Right eye exhibits no discharge. Left eye exhibits no discharge. No scleral icterus.  Neck: Normal range of motion. Neck supple. No JVD present. No tracheal deviation present. No thyromegaly present.  Cardiovascular: Normal rate, regular rhythm, normal heart sounds and intact distal pulses.   Pulmonary/Chest: Effort normal. No respiratory distress. He has wheezes. He has no rales. He exhibits no tenderness.  Abdominal: Soft. Bowel sounds are normal. He exhibits no distension and no mass. There is no tenderness. There is no rebound and no guarding.  Musculoskeletal: Normal range of motion. He exhibits no edema, tenderness or deformity.  Lymphadenopathy:    He has no cervical adenopathy.  Neurological: He is alert and oriented to person, place, and time. Gait normal.  Skin: Skin is warm and dry. No rash noted. No erythema. No pallor.  Psychiatric: Affect normal.  Nursing note and vitals reviewed.   LABORATORY DATA:. No visits with results within 3 Day(s) from this visit.  Latest known visit with results is:  Office Visit on 07/31/2016  Component Date Value Ref Range Status  . Sodium 08/07/2016 138  136 - 145 mEq/L Final  . Potassium 08/07/2016 5.1  3.5 - 5.1 mEq/L Final  . Chloride 08/07/2016 104  98 - 109 mEq/L Final  . CO2 08/07/2016 23  22 - 29 mEq/L Final  . Glucose 08/07/2016 115  70 - 140 mg/dl Final  . BUN 08/07/2016 22.6  7.0 - 26.0 mg/dL Final  . Creatinine 08/07/2016 1.1  0.7 - 1.3 mg/dL Final  . Total Bilirubin 08/07/2016 0.42  0.20 - 1.20 mg/dL Final  . Alkaline Phosphatase 08/07/2016 100  40 - 150 U/L Final  . AST 08/07/2016 33  5 - 34 U/L Final  . ALT 08/07/2016 23  0 - 55 U/L Final  . Total Protein 08/07/2016 7.1  6.4 - 8.3 g/dL Final  . Albumin 08/07/2016 3.4* 3.5 - 5.0 g/dL Final  . Calcium 08/07/2016 10.2   8.4 - 10.4 mg/dL Final  . Anion Gap 08/07/2016 11  3 - 11 mEq/L Final  . EGFR 08/07/2016 66* >90 ml/min/1.73 m2 Final  . Protein,  ur 08/07/2016 30  Negative- <30 mg/dL Final  . WBC 08/07/2016 6.8  4.0 - 10.3 10e3/uL Final  . NEUT# 08/07/2016 5.0  1.5 - 6.5 10e3/uL Final  . HGB 08/07/2016 11.9* 13.0 - 17.1 g/dL Final  . HCT 08/07/2016 37.1* 38.4 - 49.9 % Final  . Platelets 08/07/2016 280  140 - 400 10e3/uL Final  . MCV 08/07/2016 95.4  79.3 - 98.0 fL Final  . MCH 08/07/2016 30.6  27.2 - 33.4 pg Final  . MCHC 08/07/2016 32.1  32.0 - 36.0 g/dL Final  . RBC 08/07/2016 3.89* 4.20 - 5.82 10e6/uL Final  . RDW 08/07/2016 12.8  11.0 - 14.6 % Final  . lymph# 08/07/2016 0.9  0.9 - 3.3 10e3/uL Final  . MONO# 08/07/2016 0.8  0.1 - 0.9 10e3/uL Final  . Eosinophils Absolute 08/07/2016 0.0  0.0 - 0.5 10e3/uL Final  . Basophils Absolute 08/07/2016 0.0  0.0 - 0.1 10e3/uL Final  . NEUT% 08/07/2016 73.9  39.0 - 75.0 % Final  . LYMPH% 08/07/2016 13.8* 14.0 - 49.0 % Final  . MONO% 08/07/2016 11.7  0.0 - 14.0 % Final  . EOS% 08/07/2016 0.3  0.0 - 7.0 % Final  . BASO% 08/07/2016 0.3  0.0 - 2.0 % Final    RADIOGRAPHIC STUDIES: No results found.  ASSESSMENT/PLAN:    Lung cancer, right upper lobe Patient presented to the [facility] today with complaint of continued shortness of breath, increased shortness of breath with any exertion, some wheezing, and a chronic aching in his chest.  He was scheduled to initiate his first cycle of carboplatin/Alimta/Avastin chemotherapy next Wednesday, 08/13/2016.  He has been taking occasional oxycodone and also is requesting a refill of his Hycodan cough syrup today.  Exam today reveals patient with mild shortness of breath and some trace wheezing as well.  No acute respiratory distress noted O2 sat was 99% and patient was afebrile.  Reviewed all findings with Dr. Julien Nordmann; and he recommended patient try an albuterol inhaler for his wheezing.  Patient will also be given  a refill of his Hycodan cough syrup.  Patient also asked if he could be considered for Advair as well-but Dr. Julien Nordmann to prefer that we instead order a pulmonology referral and a radiation oncology referral as well.  Due to previous cancellation of chemotherapy with another patient-patient was able to proceed today with his first cycle of chemotherapy today.  Will need to rearrange patient schedule since patient's chemotherapy started today instead of next week as planned.  Also, patient's son, [name] asked questions regarding genetic testing for his father.  Advised son.  I would review these questions with Dr. Julien Nordmann and then call son back.     Patient stated understanding of all instructions; and was in agreement with this plan of care. The patient knows to call the clinic with any problems, questions or concerns.   Total time spent with patient was 40 minutes;  with greater than 75 percent of that time spent in face to face counseling regarding patient's symptoms,  and coordination of care and follow up.  Disclaimer:This dictation was prepared with Dragon/digital dictation along with [employer]. Any transcriptional errors that result from this process are unintentional.  Drue Second, NP 08/07/2016

## 2016-08-07 NOTE — Assessment & Plan Note (Signed)
Patient presented to the [facility] today with complaint of continued shortness of breath, increased shortness of breath with any exertion, some wheezing, and a chronic aching in his chest.  He was scheduled to initiate his first cycle of carboplatin/Alimta/Avastin chemotherapy next Wednesday, 08/13/2016.  He has been taking occasional oxycodone and also is requesting a refill of his Hycodan cough syrup today.  Exam today reveals patient with mild shortness of breath and some trace wheezing as well.  No acute respiratory distress noted O2 sat was 99% and patient was afebrile.  Reviewed all findings with Dr. Julien Nordmann; and he recommended patient try an albuterol inhaler for his wheezing.  Patient will also be given a refill of his Hycodan cough syrup.  Patient also asked if he could be considered for Advair as well-but Dr. Julien Nordmann to prefer that we instead order a pulmonology referral and a radiation oncology referral as well.  Due to previous cancellation of chemotherapy with another patient-patient was able to proceed today with his first cycle of chemotherapy today.  Will need to rearrange patient schedule since patient's chemotherapy started today instead of next week as planned.  Also, patient's son, [name] asked questions regarding genetic testing for his father.  Advised son.  I would review these questions with Dr. Julien Nordmann and then call son back.

## 2016-08-07 NOTE — Patient Instructions (Signed)
Carboplatin injection What is this medicine? CARBOPLATIN (KAR boe pla tin) is a chemotherapy drug. It targets fast dividing cells, like cancer cells, and causes these cells to die. This medicine is used to treat ovarian cancer and many other cancers. This medicine may be used for other purposes; ask your health care provider or pharmacist if you have questions. COMMON BRAND NAME(S): Paraplatin What should I tell my health care provider before I take this medicine? They need to know if you have any of these conditions: -blood disorders -hearing problems -kidney disease -recent or ongoing radiation therapy -an unusual or allergic reaction to carboplatin, cisplatin, other chemotherapy, other medicines, foods, dyes, or preservatives -pregnant or trying to get pregnant -breast-feeding How should I use this medicine? This drug is usually given as an infusion into a vein. It is administered in a hospital or clinic by a specially trained health care professional. Talk to your pediatrician regarding the use of this medicine in children. Special care may be needed. Overdosage: If you think you have taken too much of this medicine contact a poison control center or emergency room at once. NOTE: This medicine is only for you. Do not share this medicine with others. What if I miss a dose? It is important not to miss a dose. Call your doctor or health care professional if you are unable to keep an appointment. What may interact with this medicine? -medicines for seizures -medicines to increase blood counts like filgrastim, pegfilgrastim, sargramostim -some antibiotics like amikacin, gentamicin, neomycin, streptomycin, tobramycin -vaccines Talk to your doctor or health care professional before taking any of these medicines: -acetaminophen -aspirin -ibuprofen -ketoprofen -naproxen This list may not describe all possible interactions. Give your health care provider a list of all the medicines, herbs,  non-prescription drugs, or dietary supplements you use. Also tell them if you smoke, drink alcohol, or use illegal drugs. Some items may interact with your medicine. What should I watch for while using this medicine? Your condition will be monitored carefully while you are receiving this medicine. You will need important blood work done while you are taking this medicine. This drug may make you feel generally unwell. This is not uncommon, as chemotherapy can affect healthy cells as well as cancer cells. Report any side effects. Continue your course of treatment even though you feel ill unless your doctor tells you to stop. In some cases, you may be given additional medicines to help with side effects. Follow all directions for their use. Call your doctor or health care professional for advice if you get a fever, chills or sore throat, or other symptoms of a cold or flu. Do not treat yourself. This drug decreases your body's ability to fight infections. Try to avoid being around people who are sick. This medicine may increase your risk to bruise or bleed. Call your doctor or health care professional if you notice any unusual bleeding. Be careful brushing and flossing your teeth or using a toothpick because you may get an infection or bleed more easily. If you have any dental work done, tell your dentist you are receiving this medicine. Avoid taking products that contain aspirin, acetaminophen, ibuprofen, naproxen, or ketoprofen unless instructed by your doctor. These medicines may hide a fever. Do not become pregnant while taking this medicine. Women should inform their doctor if they wish to become pregnant or think they might be pregnant. There is a potential for serious side effects to an unborn child. Talk to your health care professional or  pharmacist for more information. Do not breast-feed an infant while taking this medicine. What side effects may I notice from receiving this medicine? Side effects  that you should report to your doctor or health care professional as soon as possible: -allergic reactions like skin rash, itching or hives, swelling of the face, lips, or tongue -signs of infection - fever or chills, cough, sore throat, pain or difficulty passing urine -signs of decreased platelets or bleeding - bruising, pinpoint red spots on the skin, black, tarry stools, nosebleeds -signs of decreased red blood cells - unusually weak or tired, fainting spells, lightheadedness -breathing problems -changes in hearing -changes in vision -chest pain -high blood pressure -low blood counts - This drug may decrease the number of white blood cells, red blood cells and platelets. You may be at increased risk for infections and bleeding. -nausea and vomiting -pain, swelling, redness or irritation at the injection site -pain, tingling, numbness in the hands or feet -problems with balance, talking, walking -trouble passing urine or change in the amount of urine Side effects that usually do not require medical attention (report to your doctor or health care professional if they continue or are bothersome): -hair loss -loss of appetite -metallic taste in the mouth or changes in taste This list may not describe all possible side effects. Call your doctor for medical advice about side effects. You may report side effects to FDA at 1-800-FDA-1088. Where should I keep my medicine? This drug is given in a hospital or clinic and will not be stored at home. NOTE: This sheet is a summary. It may not cover all possible information. If you have questions about this medicine, talk to your doctor, pharmacist, or health care provider.  2017 [employer] Standard (2007-09-21 14:38:05)   Pemetrexed injection What is this medicine? PEMETREXED (PEM e TREX ed) is a chemotherapy drug. This medicine affects cells that are rapidly growing, such as cancer cells and cells in your mouth and stomach. It is usually used  to treat lung cancers like non-small cell lung cancer and mesothelioma. It may also be used to treat other cancers. This medicine may be used for other purposes; ask your health care provider or pharmacist if you have questions. COMMON BRAND NAME(S): Alimta What should I tell my health care provider before I take this medicine? They need to know if you have any of these conditions: -if you frequently drink alcohol containing beverages -infection (especially a virus infection such as chickenpox, cold sores, or herpes) -kidney disease -liver disease -low blood counts, like low platelets, red bloods, or white blood cells -an unusual or allergic reaction to pemetrexed, mannitol, other medicines, foods, dyes, or preservatives -pregnant or trying to get pregnant -breast-feeding How should I use this medicine? This drug is given as an infusion into a vein. It is administered in a hospital or clinic by a specially trained health care professional. Talk to your pediatrician regarding the use of this medicine in children. Special care may be needed. Overdosage: If you think you have taken too much of this medicine contact a poison control center or emergency room at once. NOTE: This medicine is only for you. Do not share this medicine with others. What if I miss a dose? It is important not to miss your dose. Call your doctor or health care professional if you are unable to keep an appointment. What may interact with this medicine? -aspirin and aspirin-like medicines -medicines to increase blood counts like filgrastim, pegfilgrastim, sargramostim -methotrexate -NSAIDS, medicines  for pain and inflammation, like ibuprofen or naproxen -probenecid -pyrimethamine -vaccines Talk to your doctor or health care professional before taking any of these medicines: -acetaminophen -aspirin -ibuprofen -ketoprofen -naproxen This list may not describe all possible interactions. Give your health care provider a  list of all the medicines, herbs, non-prescription drugs, or dietary supplements you use. Also tell them if you smoke, drink alcohol, or use illegal drugs. Some items may interact with your medicine. What should I watch for while using this medicine? Visit your doctor for checks on your progress. This drug may make you feel generally unwell. This is not uncommon, as chemotherapy can affect healthy cells as well as cancer cells. Report any side effects. Continue your course of treatment even though you feel ill unless your doctor tells you to stop. In some cases, you may be given additional medicines to help with side effects. Follow all directions for their use. Call your doctor or health care professional for advice if you get a fever, chills or sore throat, or other symptoms of a cold or flu. Do not treat yourself. This drug decreases your body's ability to fight infections. Try to avoid being around people who are sick. This medicine may increase your risk to bruise or bleed. Call your doctor or health care professional if you notice any unusual bleeding. Be careful brushing and flossing your teeth or using a toothpick because you may get an infection or bleed more easily. If you have any dental work done, tell your dentist you are receiving this medicine. Avoid taking products that contain aspirin, acetaminophen, ibuprofen, naproxen, or ketoprofen unless instructed by your doctor. These medicines may hide a fever. Call your doctor or health care professional if you get diarrhea or mouth sores. Do not treat yourself. To protect your kidneys, drink water or other fluids as directed while you are taking this medicine. Men and women must use effective birth control while taking this medicine. You may also need to continue using effective birth control for a time after stopping this medicine. Do not become pregnant while taking this medicine. Tell your doctor right away if you think that you or your partner  might be pregnant. There is a potential for serious side effects to an unborn child. Talk to your health care professional or pharmacist for more information. Do not breast-feed an infant while taking this medicine. This medicine may lower sperm counts. What side effects may I notice from receiving this medicine? Side effects that you should report to your doctor or health care professional as soon as possible: -allergic reactions like skin rash, itching or hives, swelling of the face, lips, or tongue -low blood counts - this medicine may decrease the number of white blood cells, red blood cells and platelets. You may be at increased risk for infections and bleeding. -signs of infection - fever or chills, cough, sore throat, pain or difficulty passing urine -signs of decreased platelets or bleeding - bruising, pinpoint red spots on the skin, black, tarry stools, blood in the urine -signs of decreased red blood cells - unusually weak or tired, fainting spells, lightheadedness -breathing problems, like a dry cough -changes in emotions or moods -chest pain -confusion -diarrhea -high blood pressure -mouth or throat sores or ulcers -pain, swelling, warmth in the leg -pain on swallowing -swelling of the ankles, feet, hands -trouble passing urine or change in the amount of urine -vomiting -yellowing of the eyes or skin Side effects that usually do not require medical attention (  report to your doctor or health care professional if they continue or are bothersome): -hair loss -loss of appetite -nausea -stomach upset This list may not describe all possible side effects. Call your doctor for medical advice about side effects. You may report side effects to FDA at 1-800-FDA-1088. Where should I keep my medicine? This drug is given in a hospital or clinic and will not be stored at home. NOTE: This sheet is a summary. It may not cover all possible information. If you have questions about this  medicine, talk to your doctor, pharmacist, or health care provider.  2017 [employer]/Gold Standard (2008-01-18 13:24:03)   Bevacizumab injection What is this medicine? BEVACIZUMAB (be va SIZ yoo mab) is a monoclonal antibody. It is used to treat cervical cancer, colorectal cancer, glioblastoma multiforme, non-small cell lung cancer (NSCLC), ovarian cancer, and renal cell cancer. This medicine may be used for other purposes; ask your health care provider or pharmacist if you have questions. COMMON BRAND NAME(S): Avastin What should I tell my health care provider before I take this medicine? They need to know if you have any of these conditions: -blood clots -heart disease, including heart failure, heart attack, or chest pain (angina) -high blood pressure -infection (especially a virus infection such as chickenpox, cold sores, or herpes) -kidney disease -lung disease -prior chemotherapy with doxorubicin, daunorubicin, epirubicin, or other anthracycline type chemotherapy agents -recent or ongoing radiation therapy -recent surgery -stroke -an unusual or allergic reaction to bevacizumab, hamster proteins, mouse proteins, other medicines, foods, dyes, or preservatives -pregnant or trying to get pregnant -breast-feeding How should I use this medicine? This medicine is for infusion into a vein. It is given by a health care professional in a hospital or clinic setting. Talk to your pediatrician regarding the use of this medicine in children. Special care may be needed. Overdosage: If you think you have taken too much of this medicine contact a poison control center or emergency room at once. NOTE: This medicine is only for you. Do not share this medicine with others. What if I miss a dose? It is important not to miss your dose. Call your doctor or health care professional if you are unable to keep an appointment. What may interact with this medicine? Interactions are not expected. This list  may not describe all possible interactions. Give your health care provider a list of all the medicines, herbs, non-prescription drugs, or dietary supplements you use. Also tell them if you smoke, drink alcohol, or use illegal drugs. Some items may interact with your medicine. What should I watch for while using this medicine? Your condition will be monitored carefully while you are receiving this medicine. You will need important blood work and urine testing done while you are taking this medicine. During your treatment, let your health care professional know if you have any unusual symptoms, such as difficulty breathing. This medicine may rarely cause 'gastrointestinal perforation' (holes in the stomach, intestines or colon), a serious side effect requiring surgery to repair. This medicine should be started at least 28 days following major surgery and the site of the surgery should be totally healed. Check with your doctor before scheduling dental work or surgery while you are receiving this treatment. Talk to your doctor if you have recently had surgery or if you have a wound that has not healed. Do not become pregnant while taking this medicine or for 6 months after stopping it. Women should inform their doctor if they wish to become pregnant or  think they might be pregnant. There is a potential for serious side effects to an unborn child. Talk to your health care professional or pharmacist for more information. Do not breast-feed an infant while taking this medicine. This medicine has caused ovarian failure in some women. This medicine may interfere with the ability to have a child. You should talk to your doctor or health care professional if you are concerned about your fertility. What side effects may I notice from receiving this medicine? Side effects that you should report to your doctor or health care professional as soon as possible: -allergic reactions like skin rash, itching or hives, swelling  of the face, lips, or tongue -breathing problems -changes in vision -chest pain -confusion -jaw pain, especially after dental work -mouth sores -seizures -severe abdominal pain -severe headache -signs of decreased platelets or bleeding - bruising, pinpoint red spots on the skin, black, tarry stools, nosebleeds, blood in the urine -signs of infection - fever or chills, cough, sore throat, pain or trouble passing urine -sudden numbness or weakness of the face, arm or leg -swelling of legs or ankles -symptoms of a stroke: change in mental awareness, inability to talk or move one side of the body (especially in patients with lung cancer) -trouble passing urine or change in the amount of urine -trouble speaking or understanding -trouble walking, dizziness, loss of balance or coordination Side effects that usually do not require medical attention (report to your doctor or health care professional if they continue or are bothersome): -constipation -diarrhea -dry skin -headache -loss of appetite -nausea, vomiting This list may not describe all possible side effects. Call your doctor for medical advice about side effects. You may report side effects to FDA at 1-800-FDA-1088. Where should I keep my medicine? This drug is given in a hospital or clinic and will not be stored at home. NOTE: This sheet is a summary. It may not cover all possible information. If you have questions about this medicine, talk to your doctor, pharmacist, or health care provider.  2017 [employer] Standard (2015-06-08 15:28:53)

## 2016-08-07 NOTE — Telephone Encounter (Signed)
Called to update patient's son [name] regarding his question to Dr. Julien Nordmann about immunotherapy.  Unfortunately, patient's genetic markers were negative; so, chemotherapy is the only option for treatment for the patient at this time.  Patient's son [name] was also asking regarding life expectancy of his father; and this provider.  Advised that patient review this question with Dr. Julien Nordmann at the next visit.

## 2016-08-07 NOTE — Patient Instructions (Signed)
[facility] Discharge Instructions for Patients Receiving Chemotherapy  Today you received the following chemotherapy agents Alimta/Carboplatin/Avastin  To help prevent nausea and vomiting after your treatment, we encourage you to take your nausea medication as directed.    If you develop nausea and vomiting that is not controlled by your nausea medication, call the clinic.   BELOW ARE SYMPTOMS THAT SHOULD BE REPORTED IMMEDIATELY:  *FEVER GREATER THAN 100.5 F  *CHILLS WITH OR WITHOUT FEVER  NAUSEA AND VOMITING THAT IS NOT CONTROLLED WITH YOUR NAUSEA MEDICATION  *UNUSUAL SHORTNESS OF BREATH  *UNUSUAL BRUISING OR BLEEDING  TENDERNESS IN MOUTH AND THROAT WITH OR WITHOUT PRESENCE OF ULCERS  *URINARY PROBLEMS  *BOWEL PROBLEMS  UNUSUAL RASH Items with * indicate a potential emergency and should be followed up as soon as possible.  Feel free to call the clinic you have any questions or concerns. The clinic phone number is [phone].  Please show the [facility] at check-in to the Emergency Department and triage nurse.   Pemetrexed injection What is this medicine? PEMETREXED (PEM e TREX ed) is a chemotherapy drug. This medicine affects cells that are rapidly growing, such as cancer cells and cells in your mouth and stomach. It is usually used to treat lung cancers like non-small cell lung cancer and mesothelioma. It may also be used to treat other cancers. This medicine may be used for other purposes; ask your health care provider or pharmacist if you have questions. COMMON BRAND NAME(S): Alimta What should I tell my health care provider before I take this medicine? They need to know if you have any of these conditions: -if you frequently drink alcohol containing beverages -infection (especially a virus infection such as chickenpox, cold sores, or herpes) -kidney disease -liver disease -low blood counts, like low platelets, red bloods, or white blood  cells -an unusual or allergic reaction to pemetrexed, mannitol, other medicines, foods, dyes, or preservatives -pregnant or trying to get pregnant -breast-feeding How should I use this medicine? This drug is given as an infusion into a vein. It is administered in a hospital or clinic by a specially trained health care professional. Talk to your pediatrician regarding the use of this medicine in children. Special care may be needed. Overdosage: If you think you have taken too much of this medicine contact a poison control center or emergency room at once. NOTE: This medicine is only for you. Do not share this medicine with others. What if I miss a dose? It is important not to miss your dose. Call your doctor or health care professional if you are unable to keep an appointment. What may interact with this medicine? -aspirin and aspirin-like medicines -medicines to increase blood counts like filgrastim, pegfilgrastim, sargramostim -methotrexate -NSAIDS, medicines for pain and inflammation, like ibuprofen or naproxen -probenecid -pyrimethamine -vaccines Talk to your doctor or health care professional before taking any of these medicines: -acetaminophen -aspirin -ibuprofen -ketoprofen -naproxen This list may not describe all possible interactions. Give your health care provider a list of all the medicines, herbs, non-prescription drugs, or dietary supplements you use. Also tell them if you smoke, drink alcohol, or use illegal drugs. Some items may interact with your medicine. What should I watch for while using this medicine? Visit your doctor for checks on your progress. This drug may make you feel generally unwell. This is not uncommon, as chemotherapy can affect healthy cells as well as cancer cells. Report any side effects. Continue your course of treatment  even though you feel ill unless your doctor tells you to stop. In some cases, you may be given additional medicines to help with side  effects. Follow all directions for their use. Call your doctor or health care professional for advice if you get a fever, chills or sore throat, or other symptoms of a cold or flu. Do not treat yourself. This drug decreases your body's ability to fight infections. Try to avoid being around people who are sick. This medicine may increase your risk to bruise or bleed. Call your doctor or health care professional if you notice any unusual bleeding. Be careful brushing and flossing your teeth or using a toothpick because you may get an infection or bleed more easily. If you have any dental work done, tell your dentist you are receiving this medicine. Avoid taking products that contain aspirin, acetaminophen, ibuprofen, naproxen, or ketoprofen unless instructed by your doctor. These medicines may hide a fever. Call your doctor or health care professional if you get diarrhea or mouth sores. Do not treat yourself. To protect your kidneys, drink water or other fluids as directed while you are taking this medicine. Men and women must use effective birth control while taking this medicine. You may also need to continue using effective birth control for a time after stopping this medicine. Do not become pregnant while taking this medicine. Tell your doctor right away if you think that you or your partner might be pregnant. There is a potential for serious side effects to an unborn child. Talk to your health care professional or pharmacist for more information. Do not breast-feed an infant while taking this medicine. This medicine may lower sperm counts. What side effects may I notice from receiving this medicine? Side effects that you should report to your doctor or health care professional as soon as possible: -allergic reactions like skin rash, itching or hives, swelling of the face, lips, or tongue -low blood counts - this medicine may decrease the number of white blood cells, red blood cells and platelets. You  may be at increased risk for infections and bleeding. -signs of infection - fever or chills, cough, sore throat, pain or difficulty passing urine -signs of decreased platelets or bleeding - bruising, pinpoint red spots on the skin, black, tarry stools, blood in the urine -signs of decreased red blood cells - unusually weak or tired, fainting spells, lightheadedness -breathing problems, like a dry cough -changes in emotions or moods -chest pain -confusion -diarrhea -high blood pressure -mouth or throat sores or ulcers -pain, swelling, warmth in the leg -pain on swallowing -swelling of the ankles, feet, hands -trouble passing urine or change in the amount of urine -vomiting -yellowing of the eyes or skin Side effects that usually do not require medical attention (report to your doctor or health care professional if they continue or are bothersome): -hair loss -loss of appetite -nausea -stomach upset This list may not describe all possible side effects. Call your doctor for medical advice about side effects. You may report side effects to FDA at 1-800-FDA-1088. Where should I keep my medicine? This drug is given in a hospital or clinic and will not be stored at home. NOTE: This sheet is a summary. It may not cover all possible information. If you have questions about this medicine, talk to your doctor, pharmacist, or health care provider.  2017 Elsevier/Gold Standard (2008-01-18 13:24:03)  Bevacizumab injection What is this medicine? BEVACIZUMAB (be va SIZ yoo mab) is a monoclonal antibody. It is  used to treat cervical cancer, colorectal cancer, glioblastoma multiforme, non-small cell lung cancer (NSCLC), ovarian cancer, and renal cell cancer. This medicine may be used for other purposes; ask your health care provider or pharmacist if you have questions. COMMON BRAND NAME(S): Avastin What should I tell my health care provider before I take this medicine? They need to know if you have  any of these conditions: -blood clots -heart disease, including heart failure, heart attack, or chest pain (angina) -high blood pressure -infection (especially a virus infection such as chickenpox, cold sores, or herpes) -kidney disease -lung disease -prior chemotherapy with doxorubicin, daunorubicin, epirubicin, or other anthracycline type chemotherapy agents -recent or ongoing radiation therapy -recent surgery -stroke -an unusual or allergic reaction to bevacizumab, hamster proteins, mouse proteins, other medicines, foods, dyes, or preservatives -pregnant or trying to get pregnant -breast-feeding How should I use this medicine? This medicine is for infusion into a vein. It is given by a health care professional in a hospital or clinic setting. Talk to your pediatrician regarding the use of this medicine in children. Special care may be needed. Overdosage: If you think you have taken too much of this medicine contact a poison control center or emergency room at once. NOTE: This medicine is only for you. Do not share this medicine with others. What if I miss a dose? It is important not to miss your dose. Call your doctor or health care professional if you are unable to keep an appointment. What may interact with this medicine? Interactions are not expected. This list may not describe all possible interactions. Give your health care provider a list of all the medicines, herbs, non-prescription drugs, or dietary supplements you use. Also tell them if you smoke, drink alcohol, or use illegal drugs. Some items may interact with your medicine. What should I watch for while using this medicine? Your condition will be monitored carefully while you are receiving this medicine. You will need important blood work and urine testing done while you are taking this medicine. During your treatment, let your health care professional know if you have any unusual symptoms, such as difficulty breathing. This  medicine may rarely cause 'gastrointestinal perforation' (holes in the stomach, intestines or colon), a serious side effect requiring surgery to repair. This medicine should be started at least 28 days following major surgery and the site of the surgery should be totally healed. Check with your doctor before scheduling dental work or surgery while you are receiving this treatment. Talk to your doctor if you have recently had surgery or if you have a wound that has not healed. Do not become pregnant while taking this medicine or for 6 months after stopping it. Women should inform their doctor if they wish to become pregnant or think they might be pregnant. There is a potential for serious side effects to an unborn child. Talk to your health care professional or pharmacist for more information. Do not breast-feed an infant while taking this medicine. This medicine has caused ovarian failure in some women. This medicine may interfere with the ability to have a child. You should talk to your doctor or health care professional if you are concerned about your fertility. What side effects may I notice from receiving this medicine? Side effects that you should report to your doctor or health care professional as soon as possible: -allergic reactions like skin rash, itching or hives, swelling of the face, lips, or tongue -breathing problems -changes in vision -chest pain -confusion -jaw pain,  especially after dental work -mouth sores -seizures -severe abdominal pain -severe headache -signs of decreased platelets or bleeding - bruising, pinpoint red spots on the skin, black, tarry stools, nosebleeds, blood in the urine -signs of infection - fever or chills, cough, sore throat, pain or trouble passing urine -sudden numbness or weakness of the face, arm or leg -swelling of legs or ankles -symptoms of a stroke: change in mental awareness, inability to talk or move one side of the body (especially in patients  with lung cancer) -trouble passing urine or change in the amount of urine -trouble speaking or understanding -trouble walking, dizziness, loss of balance or coordination Side effects that usually do not require medical attention (report to your doctor or health care professional if they continue or are bothersome): -constipation -diarrhea -dry skin -headache -loss of appetite -nausea, vomiting This list may not describe all possible side effects. Call your doctor for medical advice about side effects. You may report side effects to FDA at 1-800-FDA-1088. Where should I keep my medicine? This drug is given in a hospital or clinic and will not be stored at home. NOTE: This sheet is a summary. It may not cover all possible information. If you have questions about this medicine, talk to your doctor, pharmacist, or health care provider.  2017 Elsevier/Gold Standard (2015-06-08 15:28:53)  Carboplatin injection What is this medicine? CARBOPLATIN (KAR boe pla tin) is a chemotherapy drug. It targets fast dividing cells, like cancer cells, and causes these cells to die. This medicine is used to treat ovarian cancer and many other cancers. This medicine may be used for other purposes; ask your health care provider or pharmacist if you have questions. COMMON BRAND NAME(S): Paraplatin What should I tell my health care provider before I take this medicine? They need to know if you have any of these conditions: -blood disorders -hearing problems -kidney disease -recent or ongoing radiation therapy -an unusual or allergic reaction to carboplatin, cisplatin, other chemotherapy, other medicines, foods, dyes, or preservatives -pregnant or trying to get pregnant -breast-feeding How should I use this medicine? This drug is usually given as an infusion into a vein. It is administered in a hospital or clinic by a specially trained health care professional. Talk to your pediatrician regarding the use of  this medicine in children. Special care may be needed. Overdosage: If you think you have taken too much of this medicine contact a poison control center or emergency room at once. NOTE: This medicine is only for you. Do not share this medicine with others. What if I miss a dose? It is important not to miss a dose. Call your doctor or health care professional if you are unable to keep an appointment. What may interact with this medicine? -medicines for seizures -medicines to increase blood counts like filgrastim, pegfilgrastim, sargramostim -some antibiotics like amikacin, gentamicin, neomycin, streptomycin, tobramycin -vaccines Talk to your doctor or health care professional before taking any of these medicines: -acetaminophen -aspirin -ibuprofen -ketoprofen -naproxen This list may not describe all possible interactions. Give your health care provider a list of all the medicines, herbs, non-prescription drugs, or dietary supplements you use. Also tell them if you smoke, drink alcohol, or use illegal drugs. Some items may interact with your medicine. What should I watch for while using this medicine? Your condition will be monitored carefully while you are receiving this medicine. You will need important blood work done while you are taking this medicine. This drug may make you feel generally unwell.  This is not uncommon, as chemotherapy can affect healthy cells as well as cancer cells. Report any side effects. Continue your course of treatment even though you feel ill unless your doctor tells you to stop. In some cases, you may be given additional medicines to help with side effects. Follow all directions for their use. Call your doctor or health care professional for advice if you get a fever, chills or sore throat, or other symptoms of a cold or flu. Do not treat yourself. This drug decreases your body's ability to fight infections. Try to avoid being around people who are sick. This medicine  may increase your risk to bruise or bleed. Call your doctor or health care professional if you notice any unusual bleeding. Be careful brushing and flossing your teeth or using a toothpick because you may get an infection or bleed more easily. If you have any dental work done, tell your dentist you are receiving this medicine. Avoid taking products that contain aspirin, acetaminophen, ibuprofen, naproxen, or ketoprofen unless instructed by your doctor. These medicines may hide a fever. Do not become pregnant while taking this medicine. Women should inform their doctor if they wish to become pregnant or think they might be pregnant. There is a potential for serious side effects to an unborn child. Talk to your health care professional or pharmacist for more information. Do not breast-feed an infant while taking this medicine. What side effects may I notice from receiving this medicine? Side effects that you should report to your doctor or health care professional as soon as possible: -allergic reactions like skin rash, itching or hives, swelling of the face, lips, or tongue -signs of infection - fever or chills, cough, sore throat, pain or difficulty passing urine -signs of decreased platelets or bleeding - bruising, pinpoint red spots on the skin, black, tarry stools, nosebleeds -signs of decreased red blood cells - unusually weak or tired, fainting spells, lightheadedness -breathing problems -changes in hearing -changes in vision -chest pain -high blood pressure -low blood counts - This drug may decrease the number of white blood cells, red blood cells and platelets. You may be at increased risk for infections and bleeding. -nausea and vomiting -pain, swelling, redness or irritation at the injection site -pain, tingling, numbness in the hands or feet -problems with balance, talking, walking -trouble passing urine or change in the amount of urine Side effects that usually do not require medical  attention (report to your doctor or health care professional if they continue or are bothersome): -hair loss -loss of appetite -metallic taste in the mouth or changes in taste This list may not describe all possible side effects. Call your doctor for medical advice about side effects. You may report side effects to FDA at 1-800-FDA-1088. Where should I keep my medicine? This drug is given in a hospital or clinic and will not be stored at home. NOTE: This sheet is a summary. It may not cover all possible information. If you have questions about this medicine, talk to your doctor, pharmacist, or health care provider.  2017 Elsevier/Gold Standard (2007-09-21 14:38:05)

## 2016-08-11 ENCOUNTER — Other Ambulatory Visit: Payer: Self-pay | Admitting: Medical Oncology

## 2016-08-11 ENCOUNTER — Telehealth: Payer: Self-pay | Admitting: Medical Oncology

## 2016-08-11 ENCOUNTER — Encounter: Payer: Self-pay | Admitting: Nurse Practitioner

## 2016-08-11 DIAGNOSIS — C3410 Malignant neoplasm of upper lobe, unspecified bronchus or lung: Secondary | ICD-10-CM

## 2016-08-11 NOTE — Telephone Encounter (Signed)
I left message to son [name] that pt has appt wed and Julien [facility] will evaluate need for home health.

## 2016-08-13 ENCOUNTER — Ambulatory Visit: Payer: Medicare Other

## 2016-08-13 ENCOUNTER — Telehealth: Payer: Self-pay | Admitting: Internal Medicine

## 2016-08-13 ENCOUNTER — Encounter: Payer: Self-pay | Admitting: Internal Medicine

## 2016-08-13 ENCOUNTER — Ambulatory Visit (HOSPITAL_BASED_OUTPATIENT_CLINIC_OR_DEPARTMENT_OTHER): Payer: Medicare Other | Admitting: Internal Medicine

## 2016-08-13 ENCOUNTER — Other Ambulatory Visit (HOSPITAL_BASED_OUTPATIENT_CLINIC_OR_DEPARTMENT_OTHER): Payer: Medicare Other

## 2016-08-13 ENCOUNTER — Other Ambulatory Visit: Payer: Medicare Other

## 2016-08-13 ENCOUNTER — Other Ambulatory Visit: Payer: Self-pay | Admitting: Medical Oncology

## 2016-08-13 ENCOUNTER — Telehealth: Payer: Self-pay | Admitting: Medical Oncology

## 2016-08-13 VITALS — BP 120/67 | HR 90 | Temp 97.9°F | Resp 18 | Ht 70.0 in | Wt 156.4 lb

## 2016-08-13 DIAGNOSIS — I10 Essential (primary) hypertension: Secondary | ICD-10-CM | POA: Diagnosis not present

## 2016-08-13 DIAGNOSIS — C3411 Malignant neoplasm of upper lobe, right bronchus or lung: Secondary | ICD-10-CM

## 2016-08-13 DIAGNOSIS — C3410 Malignant neoplasm of upper lobe, unspecified bronchus or lung: Secondary | ICD-10-CM

## 2016-08-13 DIAGNOSIS — Z5111 Encounter for antineoplastic chemotherapy: Secondary | ICD-10-CM

## 2016-08-13 LAB — COMPREHENSIVE METABOLIC PANEL
ALT: 16 U/L (ref 0–55)
ANION GAP: 11 meq/L (ref 3–11)
AST: 27 U/L (ref 5–34)
Albumin: 3.2 g/dL — ABNORMAL LOW (ref 3.5–5.0)
Alkaline Phosphatase: 93 U/L (ref 40–150)
BILIRUBIN TOTAL: 0.52 mg/dL (ref 0.20–1.20)
BUN: 27.2 mg/dL — ABNORMAL HIGH (ref 7.0–26.0)
CALCIUM: 9.8 mg/dL (ref 8.4–10.4)
CO2: 24 mEq/L (ref 22–29)
CREATININE: 1.1 mg/dL (ref 0.7–1.3)
Chloride: 98 mEq/L (ref 98–109)
EGFR: 66 mL/min/{1.73_m2} — ABNORMAL LOW (ref >90)
Glucose: 121 mg/dl (ref 70–140)
Potassium: 5.4 mEq/L — ABNORMAL HIGH (ref 3.5–5.1)
Sodium: 134 mEq/L — ABNORMAL LOW (ref 136–145)
TOTAL PROTEIN: 7 g/dL (ref 6.4–8.3)

## 2016-08-13 LAB — CBC WITH DIFFERENTIAL/PLATELET
BASO%: 0.1 % (ref 0.0–2.0)
Basophils Absolute: 0 10*3/uL (ref 0.0–0.1)
EOS ABS: 0 10*3/uL (ref 0.0–0.5)
EOS%: 0.8 % (ref 0.0–7.0)
HEMATOCRIT: 38 % — AB (ref 38.4–49.9)
HGB: 12.7 g/dL — ABNORMAL LOW (ref 13.0–17.1)
LYMPH#: 0.8 10*3/uL — AB (ref 0.9–3.3)
LYMPH%: 20.7 % (ref 14.0–49.0)
MCH: 30.8 pg (ref 27.2–33.4)
MCHC: 33.3 g/dL (ref 32.0–36.0)
MCV: 92.6 fL (ref 79.3–98.0)
MONO#: 0.1 10*3/uL (ref 0.1–0.9)
MONO%: 2.6 % (ref 0.0–14.0)
NEUT#: 3.1 10*3/uL (ref 1.5–6.5)
NEUT%: 75.8 % — AB (ref 39.0–75.0)
PLATELETS: 233 10*3/uL (ref 140–400)
RBC: 4.11 10*6/uL — ABNORMAL LOW (ref 4.20–5.82)
RDW: 12.7 % (ref 11.0–14.6)
WBC: 4.1 10*3/uL (ref 4.0–10.3)

## 2016-08-13 NOTE — Progress Notes (Signed)
Referral made to [facility]

## 2016-08-13 NOTE — Telephone Encounter (Signed)
Faxed referral/records to [facility] home health.

## 2016-08-13 NOTE — Telephone Encounter (Signed)
Appointments scheduled per 2/14 LOS. Patient given AVS report and calendars with future scheduled appointments. 2/28 scheduled instead of 3/1 per availability of MM.

## 2016-08-13 NOTE — Addendum Note (Signed)
Addended by: Ardeen Garland on: 08/13/2016 02:43 PM   Modules accepted: Orders

## 2016-08-13 NOTE — Progress Notes (Signed)
Donald Delacruz [phone]   Fax:([phone]) [place], [place] [address] [place] [address]  DIAGNOSIS: Recurrent non-small cell lung cancer initially diagnosed as stage IB (T2a, N0, M0) moderately to poorly differentiated adenocarcinoma involving the right upper lobe diagnosed in August 2016 with disease recurrence in December 2017.  PRIOR THERAPY: 1) Status post right upper lobectomy with lymph node dissection on 01/30/2015. The Myriad lung cancer prognostic test showed high risk prognostic score of 31 and 25% risk of dying from lung cancer in 5 years. 2) Adjuvant systemic chemotherapy with carboplatin for AUC of 5 and Alimta 500 MG/M2 every 3 weeks. First dose 03/22/2015. Status post 4 cycles.  CURRENT THERAPY: Systemic chemotherapy with carboplatin for AUC of 5, Alimta 500 MG/M2 and Avastin 15 MG/KG every 3 weeks. First dose 08/13/2016.  INTERVAL HISTORY: Donald Delacruz 76 y.o. male follow-up visit accompanied by his wife and son. The patient is started systemic chemotherapy last week with carboplatin, Alimta and Avastin. He has few days of fatigue as well as nausea after the treatment. He is feeling much better today. He denied having any fever or chills. He denied having any chest pain but continues to have mild shortness of breath with exertion and cough with no hemoptysis. He lost 5 more pounds since his last visit. He denied having any current nausea, vomiting, diarrhea or constipation. He is here today for evaluation and repeat blood work.  MEDICAL HISTORY: Past Medical History:  Diagnosis Date  . 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 Cancer ([place])    lung cancer  . 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 Coronary artery calcification seen on computed tomography June 2016   CT scan of chest: Coronary artery calcifications are noted  . Dyspnea     . ED (erectile dysfunction)   . Emphysema lung ([place])   . Encounter for antineoplastic chemotherapy 07/31/2016  . Essential hypertension   . Goals of care, counseling/discussion 07/31/2016  . Insomnia   . Lipoma   . Osteoarthritis   . Restless legs syndrome   . Thoracic aortic atherosclerosis [facility]) June 2016   CT scan chest: Atherosclerosis of thoracic aorta is noted without aneurysm or dissection. Visualized portion of upper abdomen demonstrates stable calcified splenic artery and right renal    ALLERGIES:  is allergic to amlodipine; benicar [olmesartan]; and nisoldipine.  MEDICATIONS:  Current Outpatient Prescriptions  Medication Sig Dispense Refill  . acetaminophen (TYLENOL) 500 MG tablet Take 1,000 mg by mouth every 4 (four) hours as needed for moderate pain or headache.     . albuterol (PROVENTIL HFA;VENTOLIN HFA) 108 (90 Base) MCG/ACT inhaler Inhale 1-2 puffs into the lungs every 6 (six) hours as needed for wheezing or shortness of breath. 1 Inhaler 2  . aspirin EC 81 MG tablet Take 81 mg by mouth every morning.     . clobetasol ointment (TEMOVATE) 0.05 % Clobetasol Propionate 0.05 % External Ointment Apply to rash, and rub in well, twice a day as needed.  Quantity: 1;  Refills: 2   Wilson M.D., [employer] ;  Start 24-Apr-2010 Active 30 GM Tube    . dexamethasone (DECADRON) 4 MG tablet 4 mg by mouth twice a day the day before, day of and day after the chemotherapy every 3 weeks 40 tablet 0  . diltiazem (CARDIZEM CD) 180 MG 24 hr  capsule Take 180 mg by mouth every morning.     . ferrous sulfate 325 (65 FE) MG EC tablet Take 325 mg by mouth daily.    . folic acid (FOLVITE) 1 MG tablet Take 1 tablet (1 mg total) by mouth daily. 30 tablet 2  . gabapentin (NEURONTIN) 300 MG capsule Take 600 mg by mouth at bedtime.     [name] HYDROcodone-homatropine (HYCODAN) 5-1.5 MG/5ML syrup Take 5 mLs by mouth every 6 (six) hours as needed for cough. 240 mL 0  . lisinopril (PRINIVIL,ZESTRIL) 20 MG tablet Take 20  mg by mouth every morning.     . meloxicam (MOBIC) 15 MG tablet Take 15 mg by mouth daily.    . metoprolol succinate (TOPROL-XL) 100 MG 24 hr tablet Take 100 mg by mouth every morning.     . Multiple Vitamin (MULTIVITAMIN) capsule Take 1 capsule by mouth every morning.     . naproxen sodium (ANAPROX) 220 MG tablet Take 220 mg by mouth 2 (two) times daily with a meal.    . Omega-3 Fatty Acids (FISH OIL) 1000 MG CAPS Take 1,000 mg by mouth daily.    [name] oxyCODONE-acetaminophen (PERCOCET/ROXICET) 5-325 MG tablet Take 1 tablet by mouth every 6 (six) hours as needed for severe pain. 30 tablet 0  . prochlorperazine (COMPAZINE) 10 MG tablet TAKE 1 TABLET BY MOUTH EVERY 6 HOURS AS NEEDED FOR NAUSEA OR VOMITING (Patient not taking: Reported on 08/07/2016) 385 tablet 0  . sildenafil (VIAGRA) 100 MG tablet Take 100 mg by mouth daily as needed for erectile dysfunction.     . Turmeric 500 MG CAPS Take 1 capsule by mouth daily.    . vardenafil (LEVITRA) 20 MG tablet Take 20 mg by mouth daily as needed for erectile dysfunction.     . vitamin C (ASCORBIC ACID) 500 MG tablet Take 500 mg by mouth daily.     No current facility-administered medications for this visit.     SURGICAL HISTORY:  Past Surgical History:  Procedure Laterality Date  . CATARACT EXTRACTION    . COLONOSCOPY    . PILONIDAL CYST EXCISION    . VIDEO ASSISTED THORACOSCOPY (VATS)/WEDGE RESECTION Right 01/30/2015   Procedure: Right VIDEO ASSISTED THORACOSCOPY, Right upper Lobectomy with lymph node dissection and ONQ insertion;  Surgeon: [name], MD;  Location: [facility];  Service: Thoracic;  Laterality: Right;  [name] VIDEO BRONCHOSCOPY N/A 01/30/2015   Procedure: VIDEO BRONCHOSCOPY;  Surgeon: [name], MD;  Location: [facility];  Service: Thoracic;  Laterality: N/A;  . VIDEO BRONCHOSCOPY WITH ENDOBRONCHIAL ULTRASOUND N/A 07/08/2016   Procedure: VIDEO BRONCHOSCOPY WITH ENDOBRONCHIAL ULTRASOUND;  Surgeon: [name], MD;  Location: [place] [place];   Service: Thoracic;  Laterality: N/A;    REVIEW OF SYSTEMS:  A comprehensive review of systems was negative except for: Constitutional: positive for anorexia, fatigue and weight loss Respiratory: positive for cough and dyspnea on exertion   PHYSICAL EXAMINATION: General appearance: alert, cooperative, fatigued and no distress Head: Normocephalic, without obvious abnormality, atraumatic Neck: no adenopathy, no JVD, supple, symmetrical, trachea midline and thyroid not enlarged, symmetric, no tenderness/mass/nodules Lymph nodes: Cervical, supraclavicular, and axillary nodes normal. Resp: rhonchi bilaterally and wheezes bilaterally Back: symmetric, no curvature. ROM normal. No CVA tenderness. Cardio: regular rate and rhythm, S1, S2 normal, no murmur, click, rub or gallop GI: soft, non-tender; bowel sounds normal; no masses,  no organomegaly Extremities: extremities normal, atraumatic, no cyanosis or edema  ECOG PERFORMANCE STATUS: 1 - Symptomatic but completely  ambulatory  Blood pressure 120/67, pulse 90, temperature 97.9 F (36.6 C), temperature source Oral, resp. rate 18, height _0  (1.778 m), weight 156 lb 6.4 oz (70.9 kg), SpO2 97 %.  LABORATORY DATA: Lab Results  Component Value Date   WBC 4.1 08/13/2016   HGB 12.7 (L) 08/13/2016   HCT 38.0 (L) 08/13/2016   MCV 92.6 08/13/2016   PLT 233 08/13/2016      Chemistry      Component Value Date/Time   NA 138 08/07/2016 0924   K 5.1 08/07/2016 0924   CL 104 07/08/2016 0855   CO2 23 08/07/2016 0924   BUN 22.6 08/07/2016 0924   CREATININE 1.1 08/07/2016 0924      Component Value Date/Time   CALCIUM 10.2 08/07/2016 0924   ALKPHOS 100 08/07/2016 0924   AST 33 08/07/2016 0924   ALT 23 08/07/2016 0924   BILITOT 0.42 08/07/2016 0924       RADIOGRAPHIC STUDIES: US Biopsy  Result Date: 07-26-16 INDICATION: Lung cancer. EXAM: ULTRASOUND-GUIDED BIOPSY RIGHT NECK LYMPH NODE CORE BIOPSY MEDICATIONS: None. ANESTHESIA/SEDATION:  Fentanyl 100 mcg IV; Versed 2 mg IV Moderate Sedation Time:  10 The patient was continuously monitored during the procedure by the interventional radiology nurse under my direct supervision. FLUOROSCOPY TIME:  Fluoroscopy Time:  minutes  seconds ( mGy). COMPLICATIONS: None immediate. PROCEDURE: Informed written consent was obtained from the patient after a thorough discussion of the procedural risks, benefits and alternatives. All questions were addressed. Maximal Sterile Barrier Technique was utilized including caps, mask, sterile gowns, sterile gloves, sterile drape, hand hygiene and skin antiseptic. A timeout was performed prior to the initiation of the procedure. The right neck was prepped with ChloraPrep in a sterile fashion, and a sterile drape was applied covering the operative field. A sterile gown and sterile gloves were used for the procedure. Under sonographic guidance, 3 18 gauge core biopsies of an enlarged right cervical lymph node were obtained. Final imaging was performed. Patient tolerated the procedure well without complication. Vital sign monitoring by nursing staff during the procedure will continue as patient is in the special procedures unit for post procedure observation. FINDINGS: The images document guide needle placement within the enlarged right cervical lymph node. Post biopsy images demonstrate no hemorrhage. IMPRESSION: Successful ultrasound-guided core biopsy of an enlarged right neck lymph node. Electronically Signed   By: Marybelle Killings M.D.   On: July 26, 2016 14:12    ASSESSMENT AND PLAN: This is a very pleasant 76 years old white male with recurrent and metastatic non-small cell lung cancer, adenocarcinoma with negative PDL 1 expression. He is currently undergoing systemic chemotherapy with carboplatin, Alimta and Avastin status post 1 cycle last week. He is tolerating the treatment well except for fatigue and intermittent nausea which is improved with his current antiemetic I  recommended for the patient to continue on this treatment regimen for now. He is expected to start cycle #2 in 2 weeks. For the weight loss, I advised him to continue on the Decadron premedication for few more days after the treatment. He would come back for follow-up visit in 2 weeks for evaluation before starting cycle #2. He was advised to call immediately if he has any concerning symptoms in the interval. The patient voices understanding of current disease status and treatment options and is in agreement with the current care plan.  All questions were answered. The patient knows to call the clinic with any problems, questions or concerns. We can certainly see the patient  much sooner if necessary. I spent 10 minutes counseling the patient face to face. The total time spent in the appointment was 15 minutes. Disclaimer: This note was dictated with voice recognition software. Similar sounding words can inadvertently be transcribed and may not be corrected upon review.

## 2016-08-14 ENCOUNTER — Telehealth: Payer: Self-pay | Admitting: *Deleted

## 2016-08-14 ENCOUNTER — Telehealth: Payer: Self-pay | Admitting: Medical Oncology

## 2016-08-14 NOTE — Telephone Encounter (Signed)
Spoke to [place] and she received referral and they left a voice message for pt to return call to schedule appt. I asked terry to please contact son for setting up appt.

## 2016-08-14 NOTE — Telephone Encounter (Signed)
Per 2/14 LOS and staff message I have scheduled appts. Notified the scheduler

## 2016-08-15 NOTE — Progress Notes (Signed)
Thoracic Location of Tumor / Histology: Recurrent non-small cell lung cancer   Patient presented with an abnormal CT scan.  Biopsies revealed:   07/23/16 Diagnosis Lymph node, needle/core biopsy, right neck - POSITIVE FOR ADENOCARCINOMA.  07/08/16 Tissue-Flow Cytometry - T-CELLS WITHOUT ABERRANT PHENOTYPE. - MINOR B-CELL POPULATION.  Diagnosis FINE NEEDLE ASPIRATION, ENDOSCOPIC SPECIMEN B, EBUS 7 NODE # 2, (SPECIMEN 2 OF 2, COLLECTED ON 07/08/16) MALIGNANT CELLS PRESENT, SEE COMMENT.  Diagnosis FINE NEEDLE ASPIRATION, ENDOSCOPIC SPECIMEN A, EBUS 7 NODE (SPECIMEN 1 OF 3 COLLECTED 07/08/2016) MALIGNANT CELLS PRESENT, SEE COMMENT.  01/30/15 Diagnosis 1. Lung, resection (segmental or lobe), Right upper lobe - INVASIVE MODERATELY TO POORLY DIFFERENTIATED ADENOCARCINOMA, SPANNING 3.8 CM IN GREATEST DIMENSION. - VISCERAL PLEURAL INVASION IS NOT IDENTIFIED. - MARGINS ARE NEGATIVE. - SEE ONCOLOGY TEMPLATE AND COMMENT. 2. Lymph node, biopsy, #11 node - ONE BENIGN LYMPH NODE WITH NO TUMOR SEEN (0/1). 3. Lymph node, biopsy, 11 node #2 - ONE BENIGN LYMPH NODE WITH NO TUMOR SEEN (0/1). 4. Lymph node, biopsy, 10 - ONE BENIGN LYMPH NODE WITH NO TUMOR SEEN (0/1). 5. Lymph node, biopsy, 10 node #2 - ONE BENIGN LYMPH NODE WITH NO TUMOR SEEN (0/1). 6. Lymph node, biopsy, 12 R - ONE BENIGN LYMPH NODE WITH NO TUMOR SEEN (0/1). 7. Lymph node, biopsy, 8 - ONE BENIGN LYMPH NODE WITH NO TUMOR SEEN (0/1). 8. Lymph node, biopsy, 2 - BENIGN FIBROFATTY SOFT TISSUE. - NO TUMOR SEEN. - LYMPH NODE TISSUE IS NOT IDENTIFIED. 9. Lymph node, biopsy, 4 - ONE BENIGN LYMPH NODE WITH NO TUMOR SEEN (0/1). 10. Lymph node, biopsy, 4 #2 - ONE BENIGN LYMPH NODE WITH NO TUMOR SEEN (0/1). 11. Lymph node, biopsy, 4 #3 - ONE BENIGN LYMPH NODE WITH NO TUMOR SEEN (0/1).  Tobacco/Marijuana/Snuff/ETOH use: Quit smoking in 1999.  Smoked for 30 years.  Not currently using ETOH but was drinking 3-4 beers per day.  Denies snuff  or marijuana.  Past/Anticipated interventions by cardiothoracic surgery, if any: 07/08/16 - Procedure: VIDEO BRONCHOSCOPY WITH ENDOBRONCHIAL ULTRASOUND;  Surgeon: [name], MD, 01/30/15 - Procedure: Right VIDEO ASSISTED THORACOSCOPY, Right upper Lobectomy with lymph node dissection and ONQ insertion;  Surgeon: [name], MD  Past/Anticipated interventions by medical oncology, if any: Systemic chemotherapy with carboplatin for AUC of 5, Alimta 500 MG/M2 and Avastin 15 MG/KG every 3 weeks. First dose 08/13/2016.  Signs/Symptoms  Weight changes, if any: yes - has lost 7-8 lbs in the last month.  Respiratory complaints, if any: yes - has shortness of breath, has a frequent cough and takes hycodan.  Hemoptysis, if any: no  Pain issues, if any:  Yes  Has pain in his lower back, hips, knees, central chest mostly at night.  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  He is currently taking tylenol twice a day.  SAFETY ISSUES:  Prior radiation? no  Pacemaker/ICD? no   Possible current pregnancy?no  Is the patient on methotrexate? no  Current Complaints / other details:  Patient is here with his wife.  BP 120/72 (BP Location: Left Arm, Patient Position: Sitting)   Pulse 96   Temp 97.7 F (36.5 C) (Oral)   Ht '5\' 10"'$  (1.778 m)   Wt 158 lb 6.4 oz (71.8 kg)   SpO2 97%   BMI 22.73 kg/m    Wt Readings from Last 3 Encounters:  08/18/16 158 lb 6.4 oz (71.8 kg)  08/13/16 156  lb 6.4 oz (70.9 kg)  08/07/16 161 lb 4.8 oz (73.2 kg)

## 2016-08-18 ENCOUNTER — Encounter: Payer: Self-pay | Admitting: Radiation Oncology

## 2016-08-18 ENCOUNTER — Ambulatory Visit: Payer: Medicare Other

## 2016-08-18 ENCOUNTER — Ambulatory Visit
Admission: RE | Admit: 2016-08-18 | Discharge: 2016-08-18 | Disposition: A | Discharge status: Home / Self Care | Payer: Medicare Other | Source: Ambulatory Visit | Attending: Radiation Oncology | Admitting: Radiation Oncology

## 2016-08-18 VITALS — BP 120/72 | HR 96 | Temp 97.7°F | Ht 70.0 in | Wt 158.4 lb

## 2016-08-18 DIAGNOSIS — C3490 Malignant neoplasm of unspecified part of unspecified bronchus or lung: Secondary | ICD-10-CM | POA: Diagnosis not present

## 2016-08-18 DIAGNOSIS — J449 Chronic obstructive pulmonary disease, unspecified: Secondary | ICD-10-CM | POA: Diagnosis not present

## 2016-08-18 DIAGNOSIS — I2510 Atherosclerotic heart disease of native coronary artery without angina pectoris: Secondary | ICD-10-CM | POA: Diagnosis not present

## 2016-08-18 DIAGNOSIS — G2581 Restless legs syndrome: Secondary | ICD-10-CM | POA: Diagnosis not present

## 2016-08-18 DIAGNOSIS — Z87891 Personal history of nicotine dependence: Secondary | ICD-10-CM | POA: Insufficient documentation

## 2016-08-18 DIAGNOSIS — G4700 Insomnia, unspecified: Secondary | ICD-10-CM | POA: Insufficient documentation

## 2016-08-18 DIAGNOSIS — C3411 Malignant neoplasm of upper lobe, right bronchus or lung: Secondary | ICD-10-CM

## 2016-08-18 DIAGNOSIS — I10 Essential (primary) hypertension: Secondary | ICD-10-CM | POA: Insufficient documentation

## 2016-08-18 DIAGNOSIS — I700 Atherosclerosis of aorta: Secondary | ICD-10-CM | POA: Insufficient documentation

## 2016-08-18 DIAGNOSIS — Z7982 Long term (current) use of aspirin: Secondary | ICD-10-CM | POA: Insufficient documentation

## 2016-08-18 MED ORDER — HYDROCODONE-ACETAMINOPHEN 5-325 MG PO TABS: 1.0000 | ORAL_TABLET | Freq: Four times a day (QID) | ORAL | 0 refills | Status: DC | PRN

## 2016-08-18 NOTE — Progress Notes (Signed)
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  HISTORY OF PRESENT ILLNESS::Donald Delacruz is a 76 y.o. male with a medical history for hypertension, restless leg syndrome, CAD, renal artery aneurysm,COPD, and a long history of smoking with him quitting in January 1999.  The patient was seen by his primary care physician, Dr. Redmond Pulling, for routine physical examination and because of his long history of smoking he had a chest x-ray performed in late 2015 and it showed an opacity in the right upper lobe. CT scan of the chest on 05/23/14 showed a nodular density in the RUL corresponding to the abnormality on X-ray appearing to be a cluster of nodules and ground-glass opacities measuring 4.2 x 2.4 cm. This was favored to be inflammatory, but close follow up with a repeat CT scan was recommended in 3 months. Follow up CT scan of the chest on 11/29/2014 showed that the cluster of nodules and ground-glass opacities in the RUL has significantly enlarged in size compared to November 2015. A more inferior and anterior nodule also significantly increased in size.  This was followed by a PET scan on 12/06/2014 and it showed a hypermetabolic spiculated right apical solid and sub solid pulmonary nodule. The dominant area measured 2.8 x 2.3 cm with an S.U.V. max of 5.8. Contiguous or satellite nodules inferiorly and anteriorly measured up to 1.0 cm and a S.U.V. max of 3.5. There was no evidence of thoracic nodal or extrathoracic hypermetabolic  metastasis.  The patient was referred to Dr. Servando Snare on 12/20/2014 and on 12/28/2014 he underwent CT-guided biopsy of the right upper lobe lesion by interventional radiology and the final pathology (Accession: [id number]) showed adenocarcinoma. There was a small focus of malignant appearing glands. Immunohistochemistry revealed the malignant cells are positive for NapsinA and TTF-1. They are negative for p63 and cytokeratin 5/6. This immunoprofile is consistent with a lung adenocarcinoma. On 01/30/2015 the patient underwent video bronchoscopy, right video-assisted thoracoscopy with right upper lobectomy, lymph node dissection. The final pathology (Accession: [id number]) showed invasive moderately to poorly differentiated adenocarcinoma, spanning 3.8 cm in greatest dimension. There was no visceral pleural involvement and no lymphovascular invasion. The dissected lymph nodes were negative for malignancy. Dr. Servando Snare sent the tissue to [facility] lung cancer prognostic test and it showed high risk with a prognostic score of 31 and 25% risk of dying from lung cancer in 5 years.  The patient was referred to the multidisciplinary thoracic oncology clinic on 03/08/2015 for evaluation and discussion of his treatment options. Dr. Julien Nordmann offered 4 cycles of adjuvant systemic chemotherapy with cisplatin 75 MG/M2 and Alimta 500 MG/M2 every 3 weeks. CT of the head and neck on 03/15/15 showed not intracranial metastatic disease. The patient had chemotherapy from September 2016 - November 2016.  CT scan on 06/10/16 showed new right paratracheal and prevascular lymph nodes worrisome for new metastatic adenopathy. PET scan on 07/03/16 was positive for new hypermetabolic right supraclavicular, bilateral mediastinal and right hilar lymph nodes compatible with metastatic adenopathy. There were also several new foci of subtle increased uptake within the axial and appendicular skeleton worrisome for  bone metastases.  The patient  underwent bronchoscopy EBUS with biopsy on 07/08/16 by Dr. Servando Snare. There did not appear to be any tumor at the takeoff of the right upper lobe bronchial stump.He passed EBUS scope in the vicinity of the #7 mediastinal nodes and there was obvious extensive adenopathy. He then proceeded with multiple passes with a transbronchial biopsy under the guidance of the EBUS scope and sent quick smears on 3 of the passes to Pathology and then took additional passes  for cell block. After the initial smear which Pathology reported as malignant, Dr. Tresa Moore could not tell if there was adenocarcinoma consistent with the previous lung and raised the issue of possible lymphoma. After this, he took additional passes in this #7 node for possible cell flow cytometry if this was indicated based on further stain. This noted T-cells without abberant phenotype and minor B-cell population.  MRI of the brain on 07/12/16 showed no evidence of metastatic disease to the brain.  The patient had biopsy of a right supraclavicular lymph node on 07/23/16 by interventional radiology and this was positive for adenocarcinoma.  The patient saw Dr. Julien Nordmann on 07/31/16 who discussed palliative care and hospice referral vs systemic chemotherapy vs immunotherapy/target therapy if he has the appropriate markers. PD-L1 testing was negative. Dr. Julien Nordmann started systemic chemotherapy with carboplatin for AUC of 5, Alimta 500 MG/M2 and Avastin 15 MG/KG every 3 weeks on 08/13/2016.  The patient and his wife present today to discuss palliative radiation as part of his overall management.  PREVIOUS RADIATION THERAPY: No  PAST MEDICAL HISTORY:  has a past medical history of Aneurysm artery, renal [facility]) (June 2016); Cancer ([place]); Cancer associated pain (07/31/2016); Coronary artery calcification seen on computed tomography (June 2016); Dyspnea; ED (erectile dysfunction); Emphysema lung ([place]); Encounter for antineoplastic chemotherapy (07/31/2016); Essential  hypertension; Goals of care, counseling/discussion (07/31/2016); Insomnia; Lipoma; Osteoarthritis; Restless legs syndrome; and Thoracic aortic atherosclerosis [facility]) (June 2016).    PAST SURGICAL HISTORY: Past Surgical History:  Procedure Laterality Date  . CATARACT EXTRACTION    . COLONOSCOPY    . PILONIDAL CYST EXCISION    . VIDEO ASSISTED THORACOSCOPY (VATS)/WEDGE RESECTION Right 01/30/2015   Procedure: Right VIDEO ASSISTED THORACOSCOPY, Right upper Lobectomy with lymph node dissection and ONQ insertion;  Surgeon: [name], MD;  Location: [place];  Service: Thoracic;  Laterality: Right;  [name] VIDEO BRONCHOSCOPY N/A 01/30/2015   Procedure: VIDEO BRONCHOSCOPY;  Surgeon: [name], MD;  Location: [facility] OR;  Service: Thoracic;  Laterality: N/A;  . VIDEO BRONCHOSCOPY WITH ENDOBRONCHIAL ULTRASOUND N/A 07/08/2016   Procedure: VIDEO BRONCHOSCOPY WITH ENDOBRONCHIAL ULTRASOUND;  Surgeon: [name], MD;  Location: [place];  Service: Thoracic;  Laterality: N/A;    FAMILY HISTORY: family history includes Lung cancer in his mother and sister.  SOCIAL HISTORY:  reports that he quit smoking about 19 years ago. His smoking use included Cigarettes. He has a 39.00 pack-year smoking history. He has never used smokeless tobacco. He reports that he drinks about 12.6 oz of alcohol per week . He reports that he does not use drugs.  ALLERGIES: Amlodipine; Benicar [olmesartan]; and Nisoldipine  MEDICATIONS:  Current Outpatient Prescriptions  Medication Sig Dispense Refill  . acetaminophen (TYLENOL) 500 MG tablet Take 1,000 mg by mouth every 4 (four) hours as needed for moderate pain or headache.     . dexamethasone (DECADRON) 4 MG tablet 4 mg by mouth twice a day the day before, day of and day after the chemotherapy every  3 weeks 40 tablet 0  . diltiazem (CARDIZEM CD) 180 MG 24 hr capsule Take 180 mg by mouth every morning.     . ferrous sulfate 325 (65 FE) MG EC tablet Take 325 mg by mouth daily.    .  folic acid (FOLVITE) 1 MG tablet Take 1 tablet (1 mg total) by mouth daily. 30 tablet 2  . gabapentin (NEURONTIN) 300 MG capsule Take 600 mg by mouth at bedtime.     [name] HYDROcodone-homatropine (HYCODAN) 5-1.5 MG/5ML syrup Take 5 mLs by mouth every 6 (six) hours as needed for cough. 240 mL 0  . lisinopril (PRINIVIL,ZESTRIL) 20 MG tablet Take 20 mg by mouth every morning.     . metoprolol succinate (TOPROL-XL) 100 MG 24 hr tablet Take 100 mg by mouth every morning.     . Multiple Vitamin (MULTIVITAMIN) capsule Take 1 capsule by mouth every morning.     . Omega-3 Fatty Acids (FISH OIL) 1000 MG CAPS Take 1,000 mg by mouth daily.    . prochlorperazine (COMPAZINE) 10 MG tablet TAKE 1 TABLET BY MOUTH EVERY 6 HOURS AS NEEDED FOR NAUSEA OR VOMITING 385 tablet 0  . Turmeric 500 MG CAPS Take 1 capsule by mouth daily.    . vitamin C (ASCORBIC ACID) 500 MG tablet Take 500 mg by mouth daily.    [name] albuterol (PROVENTIL HFA;VENTOLIN HFA) 108 (90 Base) MCG/ACT inhaler Inhale 1-2 puffs into the lungs every 6 (six) hours as needed for wheezing or shortness of breath. (Patient not taking: Reported on 08/18/2016) 1 Inhaler 2  . aspirin EC 81 MG tablet Take 81 mg by mouth every morning.     . clobetasol ointment (TEMOVATE) 0.05 % Clobetasol Propionate 0.05 % External Ointment Apply to rash, and rub in well, twice a day as needed.  Quantity: 1;  Refills: 2   Wilson M.D., [employer] ;  Start 24-Apr-2010 Active 30 GM Tube    . HYDROcodone-acetaminophen (NORCO/VICODIN) 5-325 MG tablet Take 1 tablet by mouth every 6 (six) hours as needed for moderate pain. 30 tablet 0  . meloxicam (MOBIC) 15 MG tablet Take 15 mg by mouth daily.    . naproxen sodium (ANAPROX) 220 MG tablet Take 220 mg by mouth 2 (two) times daily with a meal.    . oxyCODONE-acetaminophen (PERCOCET/ROXICET) 5-325 MG tablet Take 1 tablet by mouth every 6 (six) hours as needed for severe pain. (Patient not taking: Reported on 08/18/2016) 30 tablet 0  . sildenafil  (VIAGRA) 100 MG tablet Take 100 mg by mouth daily as needed for erectile dysfunction.     . vardenafil (LEVITRA) 20 MG tablet Take 20 mg by mouth daily as needed for erectile dysfunction.      No current facility-administered medications for this encounter.     REVIEW OF SYSTEMS:  A 15 point review of systems is documented in the electronic medical record. This was obtained by the nursing staff. However, I reviewed this with the patient to discuss relevant findings and make appropriate changes.  Pertinent items noted in HPI and remainder of comprehensive ROS otherwise negative.   The patient has recent headaches and believes this is from watching too much television. The patient has pain in his lower back, hips, knees, and central chest mostly at night. He was given Percocet, but does not want to take it because he is the only drive in his family. He is taking Tylenol BID. He denies upper back pain. He reports a weight loss of 7-8  lbs in the past month. He has SOB and a frequent cough for which he takes hycodan. He denies hemoptysis.   PHYSICAL EXAM:  height is 5' 10"  (1.778 m) and weight is 158 lb 6.4 oz (71.8 kg). His oral temperature is 97.7 F (36.5 C). His blood pressure is 120/72 and his pulse is 96. His oxygen saturation is 97%.   General: Alert and oriented, in no acute distress HEENT: Head is normocephalic. Extraocular movements are intact. Oropharynx is clear. Neck: Neck is supple, no palpable cervical or supraclavicular lymphadenopathy. Heart: Regular in rate and rhythm with no murmurs, rubs, or gallops. Chest: Clear to auscultation bilaterally, with no rhonchi, wheezes, or rales. Abdomen: Soft, nontender, nondistended, with no rigidity or guarding. Extremities: No cyanosis or edema. Lymphatics: see Neck Exam Skin: No concerning lesions. Right lobectomy scar well healed without sign of infection or recurrence. Musculoskeletal: symmetric strength and muscle tone  throughout. Neurologic: Cranial nerves II through XII are grossly intact. No obvious focalities. Speech is fluent. Coordination is intact. Psychiatric: Judgment and insight are intact. Affect is appropriate.  ECOG = 1  0 - Asymptomatic (Fully active, able to carry on all predisease activities without restriction)  1 - Symptomatic but completely ambulatory (Restricted in physically strenuous activity but ambulatory and able to carry out work of a light or sedentary nature. For example, light housework, office work)  2 - Symptomatic, <50% in bed during the day (Ambulatory and capable of all self care but unable to carry out any work activities. Up and about more than 50% of waking hours)  3 - Symptomatic, >50% in bed, but not bedbound (Capable of only limited self-care, confined to bed or chair 50% or more of waking hours)  4 - Bedbound (Completely disabled. Cannot carry on any self-care. Totally confined to bed or chair)  5 - Death   Eustace Pen MM, Creech RH, Tormey DC, et al. [phone]). "Toxicity and response criteria of the [facility]". [place] Oncol. 5 (6): 649-55  LABORATORY DATA:  Lab Results  Component Value Date   WBC 4.1 08/13/2016   HGB 12.7 (L) 08/13/2016   HCT 38.0 (L) 08/13/2016   MCV 92.6 08/13/2016   PLT 233 08/13/2016   NEUTROABS 3.1 08/13/2016   Lab Results  Component Value Date   NA 134 (L) 08/13/2016   K 5.4 (H) 08/13/2016   CL 104 07/08/2016   CO2 24 08/13/2016   GLUCOSE 121 08/13/2016   CREATININE 1.1 08/13/2016   CALCIUM 9.8 08/13/2016   PULMONARY FUNCTION TEST:  Recent Review Flowsheet Data    There is no flowsheet data to display.       RADIOGRAPHY: US Biopsy  Result Date: 07/23/2016 INDICATION: Lung cancer. EXAM: ULTRASOUND-GUIDED BIOPSY RIGHT NECK LYMPH NODE CORE BIOPSY MEDICATIONS: None. ANESTHESIA/SEDATION: Fentanyl 100 mcg IV; Versed 2 mg IV Moderate Sedation Time:  10 The patient was continuously monitored during the  procedure by the interventional radiology nurse under my direct supervision. FLUOROSCOPY TIME:  Fluoroscopy Time:  minutes  seconds ( mGy). COMPLICATIONS: None immediate. PROCEDURE: Informed written consent was obtained from the patient after a thorough discussion of the procedural risks, benefits and alternatives. All questions were addressed. Maximal Sterile Barrier Technique was utilized including caps, mask, sterile gowns, sterile gloves, sterile drape, hand hygiene and skin antiseptic. A timeout was performed prior to the initiation of the procedure. The right neck was prepped with ChloraPrep in a sterile fashion, and a sterile drape was applied covering the operative  field. A sterile gown and sterile gloves were used for the procedure. Under sonographic guidance, 3 18 gauge core biopsies of an enlarged right cervical lymph node were obtained. Final imaging was performed. Patient tolerated the procedure well without complication. Vital sign monitoring by nursing staff during the procedure will continue as patient is in the special procedures unit for post procedure observation. FINDINGS: The images document guide needle placement within the enlarged right cervical lymph node. Post biopsy images demonstrate no hemorrhage. IMPRESSION: Successful ultrasound-guided core biopsy of an enlarged right neck lymph node. Electronically Signed   By: Marybelle Killings M.D.   On: 07/23/2016 14:12      IMPRESSION: Non-small cell lung cancer initially diagnosed as stage IB (T2a, N0, M0) moderately to poorly differentiated adenocarcinoma involving the right upper lobe diagnosed in August 2016, now with disease recurrence in December 2017.  The patient has chest pain caused from disease recurrence in that region. Palliative radiation could be used to shrink the malignant nodes and potentially relieve some of this pain. The patient also has some   hip pain consistent with his locations of osseous metastasis. However, this pain is  not severe at this time and we will hold off treating this area. The patient is currently undergoing palliative systemic chemotherapy.  Today, I talked to the patient and family about the findings and work-up thus far.  We discussed the natural history of recurrent adenocarcinoma of the lung and general treatment, highlighting the role of radiotherapy in the management.  We discussed the available radiation techniques, and focused on the details of logistics and delivery.  We reviewed the anticipated acute and late sequelae associated with radiation in this setting. The patient was encouraged to ask questions that I answered to the best of my ability.  PLAN: I prescribed Norco 5-325 for the patient's pain. He knows not to take this at the same time as his Percocet. The patient will be given chemoradiation with radiation directed to the patient's chest and I will coordinate the patient's treatment with Dr. Julien Nordmann. The patient will return in the next few days for treatment planning. I anticipate 5-6 weeks of radiation therapy.     ------------------------------------------------  Blair Promise, PhD, MD  This document serves as a record of services personally performed by Gery Pray, MD. It was created on his behalf by Darcus Austin, a trained medical scribe. The creation of this record is based on the scribe's personal observations and the provider's statements to them. This document has been checked and approved by the attending provider.

## 2016-08-19 ENCOUNTER — Encounter (HOSPITAL_COMMUNITY): Payer: Self-pay

## 2016-08-20 ENCOUNTER — Ambulatory Visit: Payer: Medicare Other | Admitting: Internal Medicine

## 2016-08-20 ENCOUNTER — Other Ambulatory Visit: Payer: Medicare Other

## 2016-08-20 ENCOUNTER — Telehealth: Payer: Self-pay | Admitting: Oncology

## 2016-08-20 NOTE — Telephone Encounter (Addendum)
Called Donald Delacruz and advised him that we will be treating his right chest.  Asked if he would like to come in for CT SIM at 10 am tomorrow.  He said that would be fine and also that he has a lab appointment at 10:30.  Advised him to come in at 56 and that I would try to move his lab appointment after CT SIM.  Left a message for Dr. Worthy Flank nurse regarding the lab appointment.  Donald Delacruz also mentioned that he is interested in having the other areas radiated that he had discussed with Dr. Sondra Come.  Advised him that Dr. Sondra Come will be notified.

## 2016-08-21 ENCOUNTER — Ambulatory Visit
Admission: RE | Admit: 2016-08-21 | Discharge: 2016-08-21 | Disposition: A | Discharge status: Home / Self Care | Payer: Medicare Other | Source: Ambulatory Visit | Attending: Radiation Oncology | Admitting: Radiation Oncology

## 2016-08-21 ENCOUNTER — Other Ambulatory Visit: Payer: Self-pay | Admitting: Radiation Oncology

## 2016-08-21 ENCOUNTER — Other Ambulatory Visit: Payer: Medicare Other

## 2016-08-21 DIAGNOSIS — C3411 Malignant neoplasm of upper lobe, right bronchus or lung: Secondary | ICD-10-CM

## 2016-08-21 DIAGNOSIS — C3490 Malignant neoplasm of unspecified part of unspecified bronchus or lung: Secondary | ICD-10-CM | POA: Diagnosis not present

## 2016-08-21 MED ORDER — BENZONATATE 100 MG PO CAPS: 100.0000 mg | ORAL_CAPSULE | Freq: Three times a day (TID) | ORAL | 2 refills | Status: AC | PRN

## 2016-08-22 ENCOUNTER — Encounter: Payer: Self-pay | Admitting: Nurse Practitioner

## 2016-08-25 NOTE — Progress Notes (Signed)
  Radiation Oncology         (336) [phone] ________________________________  Name: Donald Delacruz MRN: [id number]  Date: 08/21/2016  DOB: 04-05-1941  SIMULATION AND TREATMENT PLANNING NOTE    ICD-9-CM ICD-10-CM   1. Malignant neoplasm of upper lobe of right lung ([place]) 162.3 C34.11     DIAGNOSIS:  Non-small cell lung cancer initially diagnosed as stage IB (T2a, N0, M0) moderately to poorly differentiated adenocarcinoma involving the right upper lobe diagnosed in August 2016, now with disease recurrence in December 2017.  NARRATIVE:  The patient was brought to the [facility].  Identity was confirmed.  All relevant records and images related to the planned course of therapy were reviewed.  The patient freely provided informed written consent to proceed with treatment after reviewing the details related to the planned course of therapy. The consent form was witnessed and verified by the simulation staff.  Then, the patient was set-up in a stable reproducible  supine position for radiation therapy.  CT images were obtained.  Surface markings were placed.  The CT images were loaded into the planning software.  Then the target and avoidance structures were contoured.  Treatment planning then occurred.  The radiation prescription was entered and confirmed.  Then, I designed and supervised the construction of a total of 5 medically necessary complex treatment devices.  I have requested : 3D Simulation  I have requested a DVH of the following structures: GTV, PTV, spinal cord, lungs, heart, esophagus.  I have ordered:dose calc.  PLAN:  The patient will receive 40 Gy in 20 fractions.   Special Treatment Procedure Note: The patient will be receiving radiosensitizing chemotherapy. Given the potential of increased toxicities related to combined therapy and the necessity for close monitoring of the patient and blood work, this constitutes a special treatment  procedure.  -----------------------------------  Blair Promise, PhD, MD

## 2016-08-26 ENCOUNTER — Telehealth: Payer: Self-pay | Admitting: Medical Oncology

## 2016-08-26 NOTE — Telephone Encounter (Signed)
I returned son's call. He reports that pt is declining in terms of strength-his performance is poor. Nausea is better and he taking ensure a day. Home Health is seeing pt. I told son that pt needs to keep appt.

## 2016-08-26 NOTE — Addendum Note (Signed)
Encounter addended by: Jacqulyn Liner, RN on: 08/26/2016 10:49 AM<BR>    Actions taken: Charge Capture section accepted

## 2016-08-26 NOTE — Telephone Encounter (Addendum)
requesting orders for RN visits 2 x week for 8 weeks. I called back to authorize but no answer . Spoke towht nurse today

## 2016-08-27 ENCOUNTER — Ambulatory Visit: Payer: Medicare Other

## 2016-08-27 ENCOUNTER — Telehealth: Payer: Self-pay | Admitting: Internal Medicine

## 2016-08-27 ENCOUNTER — Other Ambulatory Visit (HOSPITAL_BASED_OUTPATIENT_CLINIC_OR_DEPARTMENT_OTHER): Payer: Medicare Other

## 2016-08-27 ENCOUNTER — Ambulatory Visit (HOSPITAL_BASED_OUTPATIENT_CLINIC_OR_DEPARTMENT_OTHER): Payer: Medicare Other

## 2016-08-27 ENCOUNTER — Encounter: Payer: Self-pay | Admitting: Internal Medicine

## 2016-08-27 ENCOUNTER — Ambulatory Visit (HOSPITAL_BASED_OUTPATIENT_CLINIC_OR_DEPARTMENT_OTHER): Payer: Medicare Other | Admitting: Internal Medicine

## 2016-08-27 ENCOUNTER — Other Ambulatory Visit: Payer: Medicare Other

## 2016-08-27 VITALS — BP 147/82 | HR 85

## 2016-08-27 DIAGNOSIS — C3411 Malignant neoplasm of upper lobe, right bronchus or lung: Secondary | ICD-10-CM

## 2016-08-27 DIAGNOSIS — R634 Abnormal weight loss: Secondary | ICD-10-CM

## 2016-08-27 DIAGNOSIS — E860 Dehydration: Secondary | ICD-10-CM | POA: Diagnosis not present

## 2016-08-27 DIAGNOSIS — Z79899 Other long term (current) drug therapy: Secondary | ICD-10-CM | POA: Diagnosis not present

## 2016-08-27 DIAGNOSIS — C3490 Malignant neoplasm of unspecified part of unspecified bronchus or lung: Secondary | ICD-10-CM | POA: Diagnosis not present

## 2016-08-27 DIAGNOSIS — F329 Major depressive disorder, single episode, unspecified: Secondary | ICD-10-CM | POA: Diagnosis not present

## 2016-08-27 DIAGNOSIS — D630 Anemia in neoplastic disease: Secondary | ICD-10-CM | POA: Diagnosis not present

## 2016-08-27 HISTORY — DX: Dehydration: E86.0

## 2016-08-27 LAB — CBC WITH DIFFERENTIAL/PLATELET
BASO%: 0 % (ref 0.0–2.0)
BASOS ABS: 0 10*3/uL (ref 0.0–0.1)
EOS%: 0 % (ref 0.0–7.0)
Eosinophils Absolute: 0 10*3/uL (ref 0.0–0.5)
HEMATOCRIT: 31.6 % — AB (ref 38.4–49.9)
HGB: 10.6 g/dL — ABNORMAL LOW (ref 13.0–17.1)
LYMPH%: 13 % — ABNORMAL LOW (ref 14.0–49.0)
MCH: 30.1 pg (ref 27.2–33.4)
MCHC: 33.5 g/dL (ref 32.0–36.0)
MCV: 89.8 fL (ref 79.3–98.0)
MONO#: 0.3 10*3/uL (ref 0.1–0.9)
MONO%: 9.1 % (ref 0.0–14.0)
NEUT#: 2.8 10*3/uL (ref 1.5–6.5)
NEUT%: 77.9 % — AB (ref 39.0–75.0)
Platelets: 419 10*3/uL — ABNORMAL HIGH (ref 140–400)
RBC: 3.52 10*6/uL — ABNORMAL LOW (ref 4.20–5.82)
RDW: 13.2 % (ref 11.0–14.6)
WBC: 3.6 10*3/uL — ABNORMAL LOW (ref 4.0–10.3)
lymph#: 0.5 10*3/uL — ABNORMAL LOW (ref 0.9–3.3)

## 2016-08-27 LAB — COMPREHENSIVE METABOLIC PANEL
ALT: 29 U/L (ref 0–55)
AST: 26 U/L (ref 5–34)
Albumin: 2.6 g/dL — ABNORMAL LOW (ref 3.5–5.0)
Alkaline Phosphatase: 110 U/L (ref 40–150)
Anion Gap: 15 mEq/L — ABNORMAL HIGH (ref 3–11)
BUN: 28.5 mg/dL — AB (ref 7.0–26.0)
CALCIUM: 10.4 mg/dL (ref 8.4–10.4)
CHLORIDE: 102 meq/L (ref 98–109)
CO2: 17 mEq/L — ABNORMAL LOW (ref 22–29)
Creatinine: 1.1 mg/dL (ref 0.7–1.3)
EGFR: 63 mL/min/{1.73_m2} — AB (ref >90)
Glucose: 224 mg/dl — ABNORMAL HIGH (ref 70–140)
POTASSIUM: 4.8 meq/L (ref 3.5–5.1)
SODIUM: 134 meq/L — AB (ref 136–145)
Total Bilirubin: 0.4 mg/dL (ref 0.20–1.20)
Total Protein: 7.2 g/dL (ref 6.4–8.3)

## 2016-08-27 LAB — UA PROTEIN, DIPSTICK - CHCC: PROTEIN: 30 mg/dL

## 2016-08-27 MED ORDER — MORPHINE SULFATE ER 30 MG PO TBCR: 30.0000 mg | EXTENDED_RELEASE_TABLET | Freq: Two times a day (BID) | ORAL | 0 refills | Status: DC

## 2016-08-27 MED ORDER — SODIUM CHLORIDE 0.9 % IV SOLN
Freq: Once | INTRAVENOUS | Status: AC
  Administered 2016-08-27: 12:00:00 via INTRAVENOUS

## 2016-08-27 MED ORDER — MIRTAZAPINE 30 MG PO TABS: 30.0000 mg | ORAL_TABLET | Freq: Every day | ORAL | 2 refills | Status: AC

## 2016-08-27 NOTE — Progress Notes (Signed)
[place] Telephone:(336) [phone]   Fax:(336) [place], MD [address] [place] [address]  DIAGNOSIS: Recurrent non-small cell lung cancer initially diagnosed as stage IB (T2a, N0, M0) moderately to poorly differentiated adenocarcinoma involving the right upper lobe diagnosed in August 2016 with disease recurrence in December 2017.  PRIOR THERAPY: 1) Status post right upper lobectomy with lymph node dissection on 01/30/2015. The Myriad lung cancer prognostic test showed high risk prognostic score of 31 and 25% risk of dying from lung cancer in 5 years. 2) Adjuvant systemic chemotherapy with carboplatin for AUC of 5 and Alimta 500 MG/M2 every 3 weeks. First dose 03/22/2015. Status post 4 cycles.  CURRENT THERAPY: Systemic chemotherapy with carboplatin for AUC of 5, Alimta 500 MG/M2 and Avastin 15 MG/KG every 3 weeks. First dose 08/13/2016. Status post one cycle.  INTERVAL HISTORY: Donald Delacruz 76 y.o. male came to the clinic today for follow-up visit accompanied by his son and wife. The patient continues to complain of increasing fatigue and weakness as well as lack of appetite and weight loss. He lost around 8 pounds since his last visit. He also continues to have pain in the lower back. He would like long-acting pain medication in addition to Percocet. He also some signs of depression. He is expected to start palliative radiotherapy tomorrow. He has no fever or chills. He denied having any significant nausea, vomiting, diarrhea or constipation. He has no chest pain but continues to have shortness of breath baseline and increased with exertion and cough. He is here today for evaluation before starting cycle #2 of his treatment.   MEDICAL HISTORY: Past Medical History:  Diagnosis Date  . 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 Cancer ([place])    lung  cancer  . 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 Coronary artery calcification seen on computed tomography June 2016   CT scan of chest: Coronary artery calcifications are noted  . Dyspnea   . ED (erectile dysfunction)   . Emphysema lung ([place])   . Encounter for antineoplastic chemotherapy 07/31/2016  . Essential hypertension   . Goals of care, counseling/discussion 07/31/2016  . Insomnia   . Lipoma   . Osteoarthritis   . Restless legs syndrome   . Thoracic aortic atherosclerosis [facility]) June 2016   CT scan chest: Atherosclerosis of thoracic aorta is noted without aneurysm or dissection. Visualized portion of upper abdomen demonstrates stable calcified splenic artery and right renal    ALLERGIES:  is allergic to amlodipine; benicar [olmesartan]; and nisoldipine.  MEDICATIONS:  Current Outpatient Prescriptions  Medication Sig Dispense Refill  . acetaminophen (TYLENOL) 500 MG tablet Take 1,000 mg by mouth every 4 (four) hours as needed for moderate pain or headache.     . albuterol (PROVENTIL HFA;VENTOLIN HFA) 108 (90 Base) MCG/ACT inhaler Inhale 1-2 puffs into the lungs every 6 (six) hours as needed for wheezing or shortness of breath. (Patient not taking: Reported on 08/18/2016) 1 Inhaler 2  . aspirin EC 81 MG tablet Take 81 mg by mouth every morning.     . benzonatate (TESSALON) 100 MG capsule Take 1 capsule (100 mg total) by mouth 3 (three) times daily as needed for cough. 30 capsule 2  . clobetasol ointment (TEMOVATE) 0.05 % Clobetasol Propionate 0.05 % External Ointment Apply to rash, and rub in well, twice a day  as needed.  Quantity: 1;  Refills: 2   Wilson M.D., [employer] ;  Start 24-Apr-2010 Active 30 GM Tube    . dexamethasone (DECADRON) 4 MG tablet 4 mg by mouth twice a day the day before, day of and day after the chemotherapy every 3 weeks 40 tablet 0  . diltiazem (CARDIZEM CD) 180 MG 24 hr capsule Take 180 mg by mouth every morning.     . ferrous sulfate 325 (65 FE) MG EC tablet Take 325 mg  by mouth daily.    . folic acid (FOLVITE) 1 MG tablet Take 1 tablet (1 mg total) by mouth daily. 30 tablet 2  . gabapentin (NEURONTIN) 300 MG capsule Take 600 mg by mouth at bedtime.     [name] HYDROcodone-acetaminophen (NORCO/VICODIN) 5-325 MG tablet Take 1 tablet by mouth every 6 (six) hours as needed for moderate pain. 30 tablet 0  . HYDROcodone-homatropine (HYCODAN) 5-1.5 MG/5ML syrup Take 5 mLs by mouth every 6 (six) hours as needed for cough. 240 mL 0  . lisinopril (PRINIVIL,ZESTRIL) 20 MG tablet Take 20 mg by mouth every morning.     . meloxicam (MOBIC) 15 MG tablet Take 15 mg by mouth daily.    . metoprolol succinate (TOPROL-XL) 100 MG 24 hr tablet Take 100 mg by mouth every morning.     . Multiple Vitamin (MULTIVITAMIN) capsule Take 1 capsule by mouth every morning.     . naproxen sodium (ANAPROX) 220 MG tablet Take 220 mg by mouth 2 (two) times daily with a meal.    . Omega-3 Fatty Acids (FISH OIL) 1000 MG CAPS Take 1,000 mg by mouth daily.    [name] oxyCODONE-acetaminophen (PERCOCET/ROXICET) 5-325 MG tablet Take 1 tablet by mouth every 6 (six) hours as needed for severe pain. (Patient not taking: Reported on 08/18/2016) 30 tablet 0  . prochlorperazine (COMPAZINE) 10 MG tablet TAKE 1 TABLET BY MOUTH EVERY 6 HOURS AS NEEDED FOR NAUSEA OR VOMITING 385 tablet 0  . sildenafil (VIAGRA) 100 MG tablet Take 100 mg by mouth daily as needed for erectile dysfunction.     . Turmeric 500 MG CAPS Take 1 capsule by mouth daily.    . vardenafil (LEVITRA) 20 MG tablet Take 20 mg by mouth daily as needed for erectile dysfunction.     . vitamin C (ASCORBIC ACID) 500 MG tablet Take 500 mg by mouth daily.     No current facility-administered medications for this visit.     SURGICAL HISTORY:  Past Surgical History:  Procedure Laterality Date  . CATARACT EXTRACTION    . COLONOSCOPY    . PILONIDAL CYST EXCISION    . VIDEO ASSISTED THORACOSCOPY (VATS)/WEDGE RESECTION Right 01/30/2015   Procedure: Right VIDEO  ASSISTED THORACOSCOPY, Right upper Lobectomy with lymph node dissection and ONQ insertion;  Surgeon: [name], MD;  Location: [place];  Service: Thoracic;  Laterality: Right;  [name] VIDEO BRONCHOSCOPY N/A 01/30/2015   Procedure: VIDEO BRONCHOSCOPY;  Surgeon: [name], MD;  Location: [place];  Service: Thoracic;  Laterality: N/A;  . VIDEO BRONCHOSCOPY WITH ENDOBRONCHIAL ULTRASOUND N/A 07/08/2016   Procedure: VIDEO BRONCHOSCOPY WITH ENDOBRONCHIAL ULTRASOUND;  Surgeon: [name], MD;  Location: [place];  Service: Thoracic;  Laterality: N/A;    REVIEW OF SYSTEMS:  Constitutional: positive for anorexia, fatigue and weight loss Eyes: negative Ears, nose, mouth, throat, and face: negative Respiratory: positive for cough, dyspnea on exertion and pleurisy/chest pain Cardiovascular: negative Gastrointestinal: negative Genitourinary:negative Integument/breast: negative Hematologic/lymphatic: negative Musculoskeletal:positive  for arthralgias and back pain Neurological: negative Behavioral/Psych: negative Endocrine: negative Allergic/Immunologic: negative   PHYSICAL EXAMINATION: General appearance: alert, cooperative, fatigued and no distress Head: Normocephalic, without obvious abnormality, atraumatic Neck: no adenopathy, no JVD, supple, symmetrical, trachea midline and thyroid not enlarged, symmetric, no tenderness/mass/nodules Lymph nodes: Cervical, supraclavicular, and axillary nodes normal. Resp: rhonchi bilaterally and wheezes bilaterally Back: symmetric, no curvature. ROM normal. No CVA tenderness. Cardio: regular rate and rhythm, S1, S2 normal, no murmur, click, rub or gallop GI: soft, non-tender; bowel sounds normal; no masses,  no organomegaly Extremities: extremities normal, atraumatic, no cyanosis or edema Neurologic: Alert and oriented X 3, normal strength and tone. Normal symmetric reflexes. Normal coordination and gait  ECOG PERFORMANCE STATUS: 1 - Symptomatic but  completely ambulatory  Blood pressure 124/74, pulse (!) 115, temperature (!) 96.1 F (35.6 C), temperature source Axillary, resp. rate (!) 21, height '5\' 10"'$  (1.778 m), weight 150 lb 14.4 oz (68.4 kg), SpO2 99 %.  LABORATORY DATA: Lab Results  Component Value Date   WBC 3.6 (L) 08/27/2016   HGB 10.6 (L) 08/27/2016   HCT 31.6 (L) 08/27/2016   MCV 89.8 08/27/2016   PLT 419 (H) 08/27/2016      Chemistry      Component Value Date/Time   NA 134 (L) 08/13/2016 1348   K 5.4 (H) 08/13/2016 1348   CL 104 07/08/2016 0855   CO2 24 08/13/2016 1348   BUN 27.2 (H) 08/13/2016 1348   CREATININE 1.1 08/13/2016 1348      Component Value Date/Time   CALCIUM 9.8 08/13/2016 1348   ALKPHOS 93 08/13/2016 1348   AST 27 08/13/2016 1348   ALT 16 08/13/2016 1348   BILITOT 0.52 08/13/2016 1348       RADIOGRAPHIC STUDIES: No results found.  ASSESSMENT AND PLAN: This is a very pleasant 76 years old white male with metastatic non-small cell lung cancer, adenocarcinoma with no actionable mutations and negative PDL 1 expression. He was started on treatment with systemic chemotherapy with carboplatin, Alimta and Avastin status post 1 cycle. He has rough time with the first cycle of his treatment with increasing fatigue and weakness as well as lack of appetite and weight loss. He is also starting palliative radiotherapy to the mediastinal lymph nodes. I recommended for the patient to hold his current systemic chemotherapy with carboplatin and Alimta for the next 3 weeks until completion of the palliative radiotherapy. For the dehydration, I will arrange for the patient to receive 1 L of normal saline today. For the weight loss and depression, I will start the patient on Remeron 30 mg by mouth daily at bedtime. For pain management, I started him on MS Contin 30 mg by mouth every 12 hours and he will continue with Percocet for breakthrough pain. For the anemia of neoplastic disease, we will continue to  monitor his hemoglobin and hematocrit closely and consider the patient for transfusion if needed. The patient would come back for follow-up visit in 3 weeks for evaluation and management of any adverse effect of his treatment before starting cycle #2. He was advised to call immediately if he has any concerning symptoms in the interval. The patient voices understanding of current disease status and treatment options and is in agreement with the current care plan.  All questions were answered. The patient knows to call the clinic with any problems, questions or concerns. We can certainly see the patient much sooner if necessary.  Disclaimer: This note was dictated with voice recognition  software. Similar sounding words can inadvertently be transcribed and may not be corrected upon review.

## 2016-08-27 NOTE — Progress Notes (Signed)
No treatment today, pt to receive 1 liter NS per Dr. Julien Nordmann. Pt declines anti nausea medication at this time.  Pt and VS stable at discharge.

## 2016-08-27 NOTE — Telephone Encounter (Signed)
Per 08/27/16 los, Chemo delayed to start on 09/17/16, then every 3 weeks. weekly labs cancelled until 09/18/16, then resumed. Patient was given a copy of the AVS report and appointment schedule per 08/27/16 los.

## 2016-08-27 NOTE — Patient Instructions (Signed)
Dehydration, Adult Dehydration is a condition in which there is not enough fluid or water in the body. This happens when you lose more fluids than you take in. Important organs, such as the kidneys, brain, and heart, cannot function without a proper amount of fluids. Any loss of fluids from the body can lead to dehydration. Dehydration can range from mild to severe. This condition should be treated right away to prevent it from becoming severe. What are the causes? This condition may be caused by:  Vomiting.  Diarrhea.  Excessive sweating, such as from heat exposure or exercise.  Not drinking enough fluid, especially:  When ill.  While doing activity that requires a lot of energy.  Excessive urination.  Fever.  Infection.  Certain medicines, such as medicines that cause the body to lose excess fluid (diuretics).  Inability to access safe drinking water.  Reduced physical ability to get adequate water and food. What increases the risk? This condition is more likely to develop in people:  Who have a poorly controlled long-term (chronic) illness, such as diabetes, heart disease, or kidney disease.  Who are age 65 or older.  Who are disabled.  Who live in a place with high altitude.  Who play endurance sports. What are the signs or symptoms? Symptoms of mild dehydration may include:   Thirst.  Dry lips.  Slightly dry mouth.  Dry, warm skin.  Dizziness. Symptoms of moderate dehydration may include:   Very dry mouth.  Muscle cramps.  Dark urine. Urine may be the color of tea.  Decreased urine production.  Decreased tear production.  Heartbeat that is irregular or faster than normal (palpitations).  Headache.  Light-headedness, especially when you stand up from a sitting position.  Fainting (syncope). Symptoms of severe dehydration may include:   Changes in skin, such as:  Cold and clammy skin.  Blotchy (mottled) or pale skin.  Skin that does  not quickly return to normal after being lightly pinched and released (poor skin turgor).  Changes in body fluids, such as:  Extreme thirst.  No tear production.  Inability to sweat when body temperature is high, such as in hot weather.  Very little urine production.  Changes in vital signs, such as:  Weak pulse.  Pulse that is more than 100 beats a minute when sitting still.  Rapid breathing.  Low blood pressure.  Other changes, such as:  Sunken eyes.  Cold hands and feet.  Confusion.  Lack of energy (lethargy).  Difficulty waking up from sleep.  Short-term weight loss.  Unconsciousness. How is this diagnosed? This condition is diagnosed based on your symptoms and a physical exam. Blood and urine tests may be done to help confirm the diagnosis. How is this treated? Treatment for this condition depends on the severity. Mild or moderate dehydration can often be treated at home. Treatment should be started right away. Do not wait until dehydration becomes severe. Severe dehydration is an emergency and it needs to be treated in a hospital. Treatment for mild dehydration may include:   Drinking more fluids.  Replacing salts and minerals in your blood (electrolytes) that you may have lost. Treatment for moderate dehydration may include:   Drinking an oral rehydration solution (ORS). This is a drink that helps you replace fluids and electrolytes (rehydrate). It can be found at pharmacies and retail stores. Treatment for severe dehydration may include:   Receiving fluids through an IV tube.  Receiving an electrolyte solution through a feeding tube that is   passed through your nose and into your stomach (nasogastric tube, or NG tube).  Correcting any abnormalities in electrolytes.  Treating the underlying cause of dehydration. Follow these instructions at home:  If directed by your health care provider, drink an ORS:  Make an ORS by following instructions on the  package.  Start by drinking small amounts, about  cup (120 mL) every 5-10 minutes.  Slowly increase how much you drink until you have taken the amount recommended by your health care provider.  Drink enough clear fluid to keep your urine clear or pale yellow. If you were told to drink an ORS, finish the ORS first, then start slowly drinking other clear fluids. Drink fluids such as:  Water. Do not drink only water. Doing that can lead to having too little salt (sodium) in the body (hyponatremia).  Ice chips.  Fruit juice that you have added water to (diluted fruit juice).  Low-calorie sports drinks.  Avoid:  Alcohol.  Drinks that contain a lot of sugar. These include high-calorie sports drinks, fruit juice that is not diluted, and soda.  Caffeine.  Foods that are greasy or contain a lot of fat or sugar.  Take over-the-counter and prescription medicines only as told by your health care provider.  Do not take sodium tablets. This can lead to having too much sodium in the body (hypernatremia).  Eat foods that contain a healthy balance of electrolytes, such as bananas, oranges, potatoes, tomatoes, and spinach.  Keep all follow-up visits as told by your health care provider. This is important. Contact a health care provider if:  You have abdominal pain that:  Gets worse.  Stays in one area (localizes).  You have a rash.  You have a stiff neck.  You are more irritable than usual.  You are sleepier or more difficult to wake up than usual.  You feel weak or dizzy.  You feel very thirsty.  You have urinated only a small amount of very dark urine over 6-8 hours. Get help right away if:  You have symptoms of severe dehydration.  You cannot drink fluids without vomiting.  Your symptoms get worse with treatment.  You have a fever.  You have a severe headache.  You have vomiting or diarrhea that:  Gets worse.  Does not go away.  You have blood or green matter  (bile) in your vomit.  You have blood in your stool. This may cause stool to look black and tarry.  You have not urinated in 6-8 hours.  You faint.  Your heart rate while sitting still is over 100 beats a minute.  You have trouble breathing. This information is not intended to replace advice given to you by your health care provider. Make sure you discuss any questions you have with your health care provider. Document Released: 06/16/2005 Document Revised: 01/11/2016 Document Reviewed: 08/10/2015 Elsevier Interactive Patient Education  2017 [employer]  

## 2016-08-28 ENCOUNTER — Other Ambulatory Visit: Payer: Medicare Other

## 2016-08-28 ENCOUNTER — Ambulatory Visit: Payer: Medicare Other

## 2016-08-28 ENCOUNTER — Ambulatory Visit
Admission: RE | Admit: 2016-08-28 | Discharge: 2016-08-28 | Disposition: A | Discharge status: Home / Self Care | Payer: Medicare Other | Source: Ambulatory Visit | Attending: Radiation Oncology | Admitting: Radiation Oncology

## 2016-08-28 DIAGNOSIS — C3490 Malignant neoplasm of unspecified part of unspecified bronchus or lung: Secondary | ICD-10-CM | POA: Diagnosis not present

## 2016-08-29 ENCOUNTER — Ambulatory Visit
Admission: RE | Admit: 2016-08-29 | Discharge: 2016-08-29 | Disposition: A | Discharge status: Home / Self Care | Payer: Medicare Other | Source: Ambulatory Visit | Attending: Radiation Oncology | Admitting: Radiation Oncology

## 2016-08-29 DIAGNOSIS — C3490 Malignant neoplasm of unspecified part of unspecified bronchus or lung: Secondary | ICD-10-CM | POA: Diagnosis not present

## 2016-09-01 ENCOUNTER — Ambulatory Visit
Admission: RE | Admit: 2016-09-01 | Discharge: 2016-09-01 | Disposition: A | Discharge status: Home / Self Care | Payer: Medicare Other | Source: Ambulatory Visit | Attending: Radiation Oncology | Admitting: Radiation Oncology

## 2016-09-01 DIAGNOSIS — C3490 Malignant neoplasm of unspecified part of unspecified bronchus or lung: Secondary | ICD-10-CM | POA: Diagnosis not present

## 2016-09-02 ENCOUNTER — Telehealth: Payer: Self-pay | Admitting: *Deleted

## 2016-09-02 ENCOUNTER — Ambulatory Visit (HOSPITAL_BASED_OUTPATIENT_CLINIC_OR_DEPARTMENT_OTHER): Payer: Medicare Other | Admitting: Nurse Practitioner

## 2016-09-02 ENCOUNTER — Other Ambulatory Visit: Payer: Self-pay | Admitting: *Deleted

## 2016-09-02 ENCOUNTER — Ambulatory Visit
Admission: RE | Admit: 2016-09-02 | Discharge: 2016-09-02 | Disposition: A | Discharge status: Home / Self Care | Payer: Medicare Other | Source: Ambulatory Visit | Attending: Radiation Oncology | Admitting: Radiation Oncology

## 2016-09-02 ENCOUNTER — Encounter: Payer: Self-pay | Admitting: Radiation Oncology

## 2016-09-02 ENCOUNTER — Ambulatory Visit (HOSPITAL_BASED_OUTPATIENT_CLINIC_OR_DEPARTMENT_OTHER): Payer: Medicare Other

## 2016-09-02 VITALS — BP 76/53 | HR 134 | Temp 97.8°F | Resp 20 | Wt 151.8 lb

## 2016-09-02 VITALS — BP 111/68 | HR 118 | Temp 97.7°F | Resp 18 | Ht 70.0 in | Wt 151.1 lb

## 2016-09-02 VITALS — BP 106/60 | HR 100

## 2016-09-02 DIAGNOSIS — E860 Dehydration: Secondary | ICD-10-CM

## 2016-09-02 DIAGNOSIS — C3411 Malignant neoplasm of upper lobe, right bronchus or lung: Secondary | ICD-10-CM

## 2016-09-02 DIAGNOSIS — C3410 Malignant neoplasm of upper lobe, unspecified bronchus or lung: Secondary | ICD-10-CM

## 2016-09-02 DIAGNOSIS — C3490 Malignant neoplasm of unspecified part of unspecified bronchus or lung: Secondary | ICD-10-CM | POA: Diagnosis not present

## 2016-09-02 LAB — COMPREHENSIVE METABOLIC PANEL
ALBUMIN: 2.6 g/dL — AB (ref 3.5–5.0)
ALT: 28 U/L (ref 0–55)
AST: 32 U/L (ref 5–34)
Alkaline Phosphatase: 97 U/L (ref 40–150)
Anion Gap: 11 mEq/L (ref 3–11)
BUN: 20 mg/dL (ref 7.0–26.0)
CO2: 23 meq/L (ref 22–29)
Calcium: 9.4 mg/dL (ref 8.4–10.4)
Chloride: 101 mEq/L (ref 98–109)
Creatinine: 1 mg/dL (ref 0.7–1.3)
EGFR: 75 mL/min/{1.73_m2} — AB (ref >90)
GLUCOSE: 134 mg/dL (ref 70–140)
Potassium: 5.1 mEq/L (ref 3.5–5.1)
SODIUM: 135 meq/L — AB (ref 136–145)
TOTAL PROTEIN: 6.4 g/dL (ref 6.4–8.3)
Total Bilirubin: 0.37 mg/dL (ref 0.20–1.20)

## 2016-09-02 LAB — CBC WITH DIFFERENTIAL/PLATELET
BASO%: 0.3 % (ref 0.0–2.0)
Basophils Absolute: 0 10*3/uL (ref 0.0–0.1)
EOS%: 0 % (ref 0.0–7.0)
Eosinophils Absolute: 0 10*3/uL (ref 0.0–0.5)
HCT: 32.5 % — ABNORMAL LOW (ref 38.4–49.9)
HGB: 10.8 g/dL — ABNORMAL LOW (ref 13.0–17.1)
LYMPH%: 16.5 % (ref 14.0–49.0)
MCH: 30.4 pg (ref 27.2–33.4)
MCHC: 33.2 g/dL (ref 32.0–36.0)
MCV: 91.5 fL (ref 79.3–98.0)
MONO#: 1.3 10*3/uL — ABNORMAL HIGH (ref 0.1–0.9)
MONO%: 21.3 % — ABNORMAL HIGH (ref 0.0–14.0)
NEUT#: 3.7 10*3/uL (ref 1.5–6.5)
NEUT%: 61.9 % (ref 39.0–75.0)
Platelets: 301 10*3/uL (ref 140–400)
RBC: 3.55 10*6/uL — ABNORMAL LOW (ref 4.20–5.82)
RDW: 14 % (ref 11.0–14.6)
WBC: 6 10*3/uL (ref 4.0–10.3)
lymph#: 1 10*3/uL (ref 0.9–3.3)

## 2016-09-02 MED ORDER — EMOLLIENT BASE EX CREA: TOPICAL_CREAM | CUTANEOUS | 0 refills | Status: AC | PRN

## 2016-09-02 MED ORDER — HYDROCODONE-HOMATROPINE 5-1.5 MG/5ML PO SYRP: 5.0000 mL | ORAL_SOLUTION | Freq: Four times a day (QID) | ORAL | 0 refills | Status: AC | PRN

## 2016-09-02 MED ORDER — SODIUM CHLORIDE 0.9 % IV SOLN
INTRAVENOUS | Status: AC
  Administered 2016-09-02: 15:00:00 via INTRAVENOUS

## 2016-09-02 MED ORDER — BIAFINE EX EMUL
Freq: Every day | CUTANEOUS | Status: DC
  Administered 2016-09-02: 13:00:00 via TOPICAL

## 2016-09-02 MED ORDER — SODIUM CHLORIDE 0.9 % IV SOLN
INTRAVENOUS | Status: DC
Start: 2016-09-02 — End: 2016-09-03
  Filled 2016-09-02: qty 1000

## 2016-09-02 NOTE — Progress Notes (Signed)
  Radiation Oncology         (336) [phone] ________________________________  Name: Donald Delacruz MRN: [id number]  Date: 09/02/2016  DOB: 03-17-1941  Weekly Radiation Therapy Management    ICD-9-CM ICD-10-CM   1. Malignant neoplasm of upper lobe of right lung (HCC) 162.3 C34.11 topical emolient (BIAFINE) emulsion     0.9 %  sodium chloride infusion  2. Malignant neoplasm of upper lobe of lung, unspecified laterality (HCC) 162.3 C34.10      Current Dose: 8 Gy     Planned Dose:  40 Gy  Narrative . . . . . . . . The patient presents for routine under treatment assessment.                                   Weekly rad tx  Chest mid 4/20 completed. Denies pain, but is SOB and light headed when standing. Takes ms contin BID, but only took last night dose. No difficulty swallowing, cough with clear sputum, poor appetite, eating very little or drinking much.                                  Set-up films were reviewed.                                 The chart was checked. Physical Findings. . .  weight is 151 lb 12.8 oz (68.9 kg). His oral temperature is 97.8 F (36.6 C). His blood pressure is 76/53 (abnormal) and his pulse is 134 (abnormal). His respiration is 20 and oxygen saturation is 98%. . Weight loss noted. Lungs are clear to auscultation bilaterally. Heart rate elevated, regular rhythm. Impression . . . . . . . The patient is tolerating radiation. Orthostatic hypotension, possibly from dehydration. Poor fluid intake. Plan . . . . . . . . . . . . Continue treatment as planned. The patient has orthostatic hypotension. We will have him set up for IV fluids today. ________________________________   Blair Promise, PhD, MD  This document serves as a record of services personally performed by Gery Pray, MD. It was created on his behalf by Darcus Austin, a trained medical scribe. The creation of this record is based on the scribe's personal observations and the provider's statements to them.  This document has been checked and approved by the attending provider.

## 2016-09-02 NOTE — Patient Instructions (Signed)
Dehydration, Adult Dehydration is a condition in which there is not enough fluid or water in the body. This happens when you lose more fluids than you take in. Important organs, such as the kidneys, brain, and heart, cannot function without a proper amount of fluids. Any loss of fluids from the body can lead to dehydration. Dehydration can range from mild to severe. This condition should be treated right away to prevent it from becoming severe. What are the causes? This condition may be caused by:  Vomiting.  Diarrhea.  Excessive sweating, such as from heat exposure or exercise.  Not drinking enough fluid, especially:  When ill.  While doing activity that requires a lot of energy.  Excessive urination.  Fever.  Infection.  Certain medicines, such as medicines that cause the body to lose excess fluid (diuretics).  Inability to access safe drinking water.  Reduced physical ability to get adequate water and food. What increases the risk? This condition is more likely to develop in people:  Who have a poorly controlled long-term (chronic) illness, such as diabetes, heart disease, or kidney disease.  Who are age 65 or older.  Who are disabled.  Who live in a place with high altitude.  Who play endurance sports. What are the signs or symptoms? Symptoms of mild dehydration may include:   Thirst.  Dry lips.  Slightly dry mouth.  Dry, warm skin.  Dizziness. Symptoms of moderate dehydration may include:   Very dry mouth.  Muscle cramps.  Dark urine. Urine may be the color of tea.  Decreased urine production.  Decreased tear production.  Heartbeat that is irregular or faster than normal (palpitations).  Headache.  Light-headedness, especially when you stand up from a sitting position.  Fainting (syncope). Symptoms of severe dehydration may include:   Changes in skin, such as:  Cold and clammy skin.  Blotchy (mottled) or pale skin.  Skin that does  not quickly return to normal after being lightly pinched and released (poor skin turgor).  Changes in body fluids, such as:  Extreme thirst.  No tear production.  Inability to sweat when body temperature is high, such as in hot weather.  Very little urine production.  Changes in vital signs, such as:  Weak pulse.  Pulse that is more than 100 beats a minute when sitting still.  Rapid breathing.  Low blood pressure.  Other changes, such as:  Sunken eyes.  Cold hands and feet.  Confusion.  Lack of energy (lethargy).  Difficulty waking up from sleep.  Short-term weight loss.  Unconsciousness. How is this diagnosed? This condition is diagnosed based on your symptoms and a physical exam. Blood and urine tests may be done to help confirm the diagnosis. How is this treated? Treatment for this condition depends on the severity. Mild or moderate dehydration can often be treated at home. Treatment should be started right away. Do not wait until dehydration becomes severe. Severe dehydration is an emergency and it needs to be treated in a hospital. Treatment for mild dehydration may include:   Drinking more fluids.  Replacing salts and minerals in your blood (electrolytes) that you may have lost. Treatment for moderate dehydration may include:   Drinking an oral rehydration solution (ORS). This is a drink that helps you replace fluids and electrolytes (rehydrate). It can be found at pharmacies and retail stores. Treatment for severe dehydration may include:   Receiving fluids through an IV tube.  Receiving an electrolyte solution through a feeding tube that is   passed through your nose and into your stomach (nasogastric tube, or NG tube).  Correcting any abnormalities in electrolytes.  Treating the underlying cause of dehydration. Follow these instructions at home:  If directed by your health care provider, drink an ORS:  Make an ORS by following instructions on the  package.  Start by drinking small amounts, about  cup (120 mL) every 5-10 minutes.  Slowly increase how much you drink until you have taken the amount recommended by your health care provider.  Drink enough clear fluid to keep your urine clear or pale yellow. If you were told to drink an ORS, finish the ORS first, then start slowly drinking other clear fluids. Drink fluids such as:  Water. Do not drink only water. Doing that can lead to having too little salt (sodium) in the body (hyponatremia).  Ice chips.  Fruit juice that you have added water to (diluted fruit juice).  Low-calorie sports drinks.  Avoid:  Alcohol.  Drinks that contain a lot of sugar. These include high-calorie sports drinks, fruit juice that is not diluted, and soda.  Caffeine.  Foods that are greasy or contain a lot of fat or sugar.  Take over-the-counter and prescription medicines only as told by your health care provider.  Do not take sodium tablets. This can lead to having too much sodium in the body (hypernatremia).  Eat foods that contain a healthy balance of electrolytes, such as bananas, oranges, potatoes, tomatoes, and spinach.  Keep all follow-up visits as told by your health care provider. This is important. Contact a health care provider if:  You have abdominal pain that:  Gets worse.  Stays in one area (localizes).  You have a rash.  You have a stiff neck.  You are more irritable than usual.  You are sleepier or more difficult to wake up than usual.  You feel weak or dizzy.  You feel very thirsty.  You have urinated only a small amount of very dark urine over 6-8 hours. Get help right away if:  You have symptoms of severe dehydration.  You cannot drink fluids without vomiting.  Your symptoms get worse with treatment.  You have a fever.  You have a severe headache.  You have vomiting or diarrhea that:  Gets worse.  Does not go away.  You have blood or green matter  (bile) in your vomit.  You have blood in your stool. This may cause stool to look black and tarry.  You have not urinated in 6-8 hours.  You faint.  Your heart rate while sitting still is over 100 beats a minute.  You have trouble breathing. This information is not intended to replace advice given to you by your health care provider. Make sure you discuss any questions you have with your health care provider. Document Released: 06/16/2005 Document Revised: 01/11/2016 Document Reviewed: 08/10/2015 Elsevier Interactive Patient Education  2017 [employer]  

## 2016-09-02 NOTE — Progress Notes (Signed)
RN visit for IV fluids   Pt came to [facility] from Rad. Onc d/t orthostatic BP changes.

## 2016-09-02 NOTE — Progress Notes (Addendum)
Weekly rad tx  Chest mid 4/20completed, no pain, but short of breath, and light headed when standing,  No pain stated, takes ms contin bid,, but only took last night dose, ,  No difficulty swallowing, has clear sputum, patient education done, poor appetite, eating very little or drinking much,  discussed ways to manage side effects, fatigue, skin irritation, throat difficulty, esophagitis, carafate  For this, hair loss chest hair, may need to eat softer foods, 5-6 smaller meals and snacks in between,  Drink more fluids, stay hydrated, ses MD weekly and prn, teach back given, offered coke per patient request  Ortho static vitals taken,  Sitting  BP 97/61 (BP Location: Right Arm, Patient Position: Sitting, Cuff Size: Normal)   Pulse (!) 118   Temp 97.8 F (36.6 C) (Oral)   Resp 20   Wt 151 lb 12.8 oz (68.9 kg)   SpO2 99% Comment: room air  BMI 21.78 kg/m   Standing: BP (!) 76/53 (BP Location: Right Arm, Patient Position: Standing, Cuff Size: Normal)   Pulse (!) 134   Temp 97.8 F (36.6 C) (Oral)   Resp 20   Wt 151 lb 12.8 oz (68.9 kg)   SpO2 98%   BMI 21.78 kg/m    \ Wt Readings from Last 3 Encounters:  09/02/16 151 lb 12.8 oz (68.9 kg)  08/27/16 150 lb 14.4 oz (68.4 kg)  08/18/16 158 lb 6.4 oz (71.8 kg)    12:59 PM

## 2016-09-02 NOTE — Telephone Encounter (Addendum)
Called symptom management [phone] after calling medical oncology, per Dr. Sondra Come, patient needs 1 liter normal saline , today if possible, if not tomorrow, waiting to hear back from them 1:20 PM Beth returned call, patient status given, escorted patient via w/c tolobby to register for IVF'S, yellow fall risk brqacelt applied toleft wrist 1:59 PM

## 2016-09-03 ENCOUNTER — Encounter: Payer: Self-pay | Admitting: Nurse Practitioner

## 2016-09-03 ENCOUNTER — Other Ambulatory Visit: Payer: Medicare Other

## 2016-09-03 ENCOUNTER — Ambulatory Visit: Payer: Medicare Other | Admitting: Internal Medicine

## 2016-09-03 ENCOUNTER — Ambulatory Visit
Admission: RE | Admit: 2016-09-03 | Discharge: 2016-09-03 | Disposition: A | Discharge status: Home / Self Care | Payer: Medicare Other | Source: Ambulatory Visit | Attending: Radiation Oncology | Admitting: Radiation Oncology

## 2016-09-03 ENCOUNTER — Ambulatory Visit: Payer: Medicare Other

## 2016-09-03 DIAGNOSIS — C3490 Malignant neoplasm of unspecified part of unspecified bronchus or lung: Secondary | ICD-10-CM | POA: Diagnosis not present

## 2016-09-03 NOTE — Progress Notes (Signed)
SYMPTOM MANAGEMENT CLINIC    Chief Complaint: Dehydration, hypotension  HPI:  Donald Delacruz 76 y.o. male diagnosed with lung cancer.  Patient is status post lobectomy.  Currently undergoing carboplatin/Alimta/Avastin chemotherapy regimen and palliative radiation treatments.   Oncology History   Patient presented with routine CXR, then followed with scans.   Lung cancer, right upper lobe   Staging form: Lung, AJCC 7th Edition     Clinical stage from 01/08/2015: Stage IIB (T3(2), N0, M0) - Signed by [name], MD on 02/02/2015     Pathologic stage from 02/02/2015: Stage IB (T2a, N0, cM0) - Signed by [name], MD on 02/02/2015       Lung cancer, right upper lobe   11/29/2014 Imaging    CT Chest Stable 9 mm subpleural nodule is noted laterally in right lower lobe.  Cluster of nodules and ground-glass opacity seen in right upper lobe on prior exam has significantly enlarged in size, and PET scan is recommended       12/06/2014 Imaging    PET scan IMPRESSION: 1. 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 2. No evidence of thoracic nodal or extrathoracic hypermetabolic metastasis.      12/28/2014 Procedure    CT BIopsy IMPRESSION: CT-guided core biopsy of a right upper lobe lesion.      01/30/2015 Pathology Results    1. Lung, resection (segmental or lobe), Right upper lobe - INVASIVE MODERATELY TO POORLY DIFFERENTIATED ADENOCARCINOMA, SPANNING 3.8 CM IN GREATEST DIMENSION. - VISCERAL PLEURAL INVASION IS NOT IDENTIFIED. - MARGINS ARE NEGATIVE. -       01/30/2015 Surgery    SURGICAL PROCEDURE: Video bronchoscopy, right video-assisted thoracoscopy with right upper lobectomy, lymph node dissection, and placement of On-Q.      02/02/2015 Initial Diagnosis    Lung cancer, right upper lobe      03/15/2015 Imaging    CT Head FINDINGS: No evidence for acute infarction, hemorrhage, mass lesion, hydrocephalus, or  extra-axial fluid.        Review of Systems  Constitutional: Positive for malaise/fatigue.  Neurological: Positive for dizziness.  All other systems reviewed and are negative.   Past Medical History:  Diagnosis Date  . 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 Cancer ([place])    lung cancer  . 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 Coronary artery calcification seen on computed tomography June 2016   CT scan of chest: Coronary artery calcifications are noted  . Dehydration 08/27/2016  . Dyspnea   . ED (erectile dysfunction)   . Emphysema lung ([place])   . Encounter for antineoplastic chemotherapy 07/31/2016  . Essential hypertension   . Goals of care, counseling/discussion 07/31/2016  . Insomnia   . Lipoma   . Osteoarthritis   . Restless legs syndrome   . Thoracic aortic atherosclerosis [facility]) June 2016   CT scan chest: Atherosclerosis of thoracic aorta is noted without aneurysm or dissection. Visualized portion of upper abdomen demonstrates stable calcified splenic artery and right renal    Past Surgical History:  Procedure Laterality Date  . CATARACT EXTRACTION    . COLONOSCOPY    . PILONIDAL CYST EXCISION    . VIDEO ASSISTED THORACOSCOPY (VATS)/WEDGE RESECTION Right 01/30/2015   Procedure: Right VIDEO ASSISTED THORACOSCOPY, Right upper Lobectomy with lymph node dissection and ONQ insertion;  Surgeon: [name], MD;  Location: [place];  Service: Thoracic;  Laterality: Right;  [name] VIDEO BRONCHOSCOPY N/A 01/30/2015   Procedure: VIDEO BRONCHOSCOPY;  Surgeon: [name], MD;  Location: [facility] OR;  Service: Thoracic;  Laterality: N/A;  . 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    is allergic to amlodipine; benicar [olmesartan]; and nisoldipine.  Allergies as of 09/02/2016      Reactions   Amlodipine Swelling   Benicar [olmesartan]    Dizziness,malaise   Nisoldipine Swelling      Medication List       Accurate as of 09/02/16 11:59 PM. Always use your most recent med list.          acetaminophen 500 MG tablet Commonly known as:  TYLENOL Take 1,000 mg by mouth every 4 (four) hours as needed for moderate pain or headache.   albuterol 108 (90 Base) MCG/ACT inhaler Commonly known as:  PROVENTIL HFA;VENTOLIN HFA Inhale 1-2 puffs into the lungs every 6 (six) hours as needed for wheezing or shortness of breath.   aspirin EC 81 MG tablet Take 81 mg by mouth every morning.   benzonatate 100 MG capsule Commonly known as:  TESSALON Take 1 capsule (100 mg total) by mouth 3 (three) times daily as needed for cough.   clobetasol ointment 0.05 % Commonly known as:  TEMOVATE Clobetasol Propionate 0.05 % External Ointment Apply to rash, and rub in well, twice a day as needed.  Quantity: 1;  Refills: 2   Wilson M.D., [employer] ;  Start 24-Apr-2010 Active 30 GM Tube   dexamethasone 4 MG tablet Commonly known as:  DECADRON 4 mg by mouth twice a day the day before, day of and day after the chemotherapy every 3 weeks   diltiazem 180 MG 24 hr capsule Commonly known as:  CARDIZEM  CD Take 180 mg by mouth every morning.   emollient cream Commonly known as:  BIAFINE Apply topically as needed.   ferrous sulfate 325 (65 FE) MG EC tablet Take 325 mg by mouth daily.   Fish Oil 1000 MG Caps Take 1,000 mg by mouth daily.   folic acid 1 MG tablet Commonly known as:  FOLVITE Take 1 tablet (1 mg total) by mouth daily.   gabapentin 300 MG capsule Commonly known as:  NEURONTIN Take 600 mg by mouth at bedtime.   HYDROcodone-acetaminophen 5-325 MG tablet Commonly known as:  NORCO/VICODIN Take 1 tablet by mouth every 6 (six) hours as needed for moderate pain.   HYDROcodone-homatropine 5-1.5 MG/5ML syrup Commonly known as:  HYCODAN Take 5 mLs by mouth every 6 (six) hours as needed for cough.   LEVITRA 20 MG tablet Generic drug:  vardenafil Take 20 mg by mouth daily as needed for erectile dysfunction.   lisinopril 20 MG tablet Commonly known as:  PRINIVIL,ZESTRIL Take 20 mg by mouth every morning.   metoprolol succinate 100 MG  24 hr tablet Commonly known as:  TOPROL-XL Take 100 mg by mouth every morning.   mirtazapine 30 MG tablet Commonly known as:  REMERON Take 1 tablet (30 mg total) by mouth at bedtime.   morphine 30 MG 12 hr tablet Commonly known as:  MS CONTIN Take 1 tablet (30 mg total) by mouth every 12 (twelve) hours.   multivitamin capsule Take 1 capsule by mouth every morning.   oxyCODONE-acetaminophen 5-325 MG tablet Commonly known as:  PERCOCET/ROXICET Take 1 tablet by mouth every 6 (six) hours as needed for severe pain.   prochlorperazine 10 MG tablet Commonly known as:  COMPAZINE TAKE 1 TABLET BY MOUTH EVERY 6 HOURS AS NEEDED FOR NAUSEA OR VOMITING   Turmeric 500 MG Caps Take 1 capsule by mouth daily.   VIAGRA 100 MG tablet Generic drug:  sildenafil Take 100 mg by mouth daily as needed for erectile dysfunction.   vitamin C 500 MG tablet Commonly known as:  ASCORBIC ACID Take 500 mg by mouth daily.        PHYSICAL  EXAMINATION  Oncology Vitals 09/02/2016 09/02/2016  Height - -  Weight - -  Weight (lbs) - -  BMI (kg/m2) - -  Temp - -  Pulse 100 96  Resp - -  SpO2 - -  BSA (m2) - -   BP Readings from Last 2 Encounters:  09/02/16 (!) 76/53  09/02/16 106/60    Physical Exam  Constitutional: He is oriented to person, place, and time. He appears dehydrated. He appears unhealthy. He appears cachectic.  HENT:  Head: Normocephalic and atraumatic.  Mouth/Throat: Oropharynx is clear and moist.  Eyes: Conjunctivae and EOM are normal. Pupils are equal, round, and reactive to light. Right eye exhibits no discharge. Left eye exhibits no discharge. No scleral icterus.  Neck: Normal range of motion. Neck supple. No JVD present. No tracheal deviation present. No thyromegaly present.  Cardiovascular: Normal rate, regular rhythm, normal heart sounds and intact distal pulses.   Pulmonary/Chest: Effort normal and breath sounds normal. No respiratory distress. He has no wheezes. He has no rales. He exhibits no tenderness.  Abdominal: Soft. Bowel sounds are normal. He exhibits no distension and no mass. There is no tenderness. There is no rebound and no guarding.  Musculoskeletal: Normal range of motion. He exhibits no edema, tenderness or deformity.  Lymphadenopathy:    He has no cervical adenopathy.  Neurological: He is alert and oriented to person, place, and time. Gait normal.  Skin: Skin is warm and dry. No rash noted. No erythema. There is pallor.  Psychiatric: Affect normal.  Nursing note and vitals reviewed.   LABORATORY DATA:. Appointment on 09/02/2016  Component Date Value Ref Range Status  . WBC 09/02/2016 6.0  4.0 - 10.3 10e3/uL Final  . NEUT# 09/02/2016 3.7  1.5 - 6.5 10e3/uL Final  . HGB 09/02/2016 10.8* 13.0 - 17.1 g/dL Final  . HCT 09/02/2016 32.5* 38.4 - 49.9 % Final  . Platelets 09/02/2016 301  140 - 400 10e3/uL Final  . MCV 09/02/2016 91.5  79.3 - 98.0 fL Final  . MCH 09/02/2016 30.4  27.2  - 33.4 pg Final  . MCHC 09/02/2016 33.2  32.0 - 36.0 g/dL Final  . RBC 09/02/2016 3.55* 4.20 - 5.82 10e6/uL Final  . RDW 09/02/2016 14.0  11.0 - 14.6 % Final  . lymph# 09/02/2016 1.0  0.9 - 3.3 10e3/uL Final  . MONO# 09/02/2016 1.3* 0.1 - 0.9 10e3/uL Final  . Eosinophils Absolute 09/02/2016  0.0  0.0 - 0.5 10e3/uL Final  . Basophils Absolute 09/02/2016 0.0  0.0 - 0.1 10e3/uL Final  . NEUT% 09/02/2016 61.9  39.0 - 75.0 % Final  . LYMPH% 09/02/2016 16.5  14.0 - 49.0 % Final  . MONO% 09/02/2016 21.3* 0.0 - 14.0 % Final  . EOS% 09/02/2016 0.0  0.0 - 7.0 % Final  . BASO% 09/02/2016 0.3  0.0 - 2.0 % Final  . Sodium 09/02/2016 135* 136 - 145 mEq/L Final  . Potassium 09/02/2016 5.1  3.5 - 5.1 mEq/L Final  . Chloride 09/02/2016 101  98 - 109 mEq/L Final  . CO2 09/02/2016 23  22 - 29 mEq/L Final  . Glucose 09/02/2016 134  70 - 140 mg/dl Final  . BUN 09/02/2016 20.0  7.0 - 26.0 mg/dL Final  . Creatinine 09/02/2016 1.0  0.7 - 1.3 mg/dL Final  . Total Bilirubin 09/02/2016 0.37  0.20 - 1.20 mg/dL Final  . Alkaline Phosphatase 09/02/2016 97  40 - 150 U/L Final  . AST 09/02/2016 32  5 - 34 U/L Final  . ALT 09/02/2016 28  0 - 55 U/L Final  . Total Protein 09/02/2016 6.4  6.4 - 8.3 g/dL Final  . Albumin 09/02/2016 2.6* 3.5 - 5.0 g/dL Final  . Calcium 09/02/2016 9.4  8.4 - 10.4 mg/dL Final  . Anion Gap 09/02/2016 11  3 - 11 mEq/L Final  . EGFR 09/02/2016 75* >90 ml/min/1.73 m2 Final    RADIOGRAPHIC STUDIES: No results found.  ASSESSMENT/PLAN:    Lung cancer, right upper lobe Patient received cycle one of his carboplatin/Alimta/Avastin chemotherapy on 08/07/2016.  Patient also is currently undergoing palliative radiation as well; with his final radiation treatment.  Scheduled for 09/24/2016.  Due to significant side effects from his first cycle of chemotherapy-Dr. Julien Nordmann made the decision to hold any further chemotherapy until patient has completed all of his radiation treatments.  Patient was  given a refill of Hycodan cough syrup per his request today.  Agent is scheduled to return for labs, visit, and his next cycle of chemotherapy on 09/18/2016.  Dehydration Patient has a history of hypertension and cardiac issues.  He takes Cardizem, CD 180 mg daily, lisinopril 20 mg at night, and Toprol-XL 100 mg every morning.  As directed per his primary care physician Dr. Kathryne Eriksson.  Patient states that he has had decreased appetite and very poor oral intake recently.  He feels mildly dehydrated today.  While patient was in radiation oncology this afternoon.  It was noted that his blood pressure was as low as 76/53 with a heart rate of 134.  Patient was afebrile time with temperature 97.8.  Patient stated he also felt lightheaded when he changes positions quickly.  Also of note-patient takes MS Contin 30 mg as well.  He is prescribed MS Contin to be taken twice daily; but states he only takes the MS Contin at night because it makes him too sleepy.  Exam today-patient appeared fatigued and slightly weak; but neurologically intact.  He did appear mildly dehydrated.  Sodium levels 135.  Blood counts were essentially stable.  Patient will receive IV fluid rehydration today and we'll recheck blood pressure after IV fluids.  Patient will continue to check his blood pressure on a daily basis.  If patient's blood pressure remains low.-We'll need to consider adjusting patient's cardiac and hypertension medications as needed.  Patient was advised to call/return of electrical to the emergency department for any worsening symptoms whatsoever.   Patient  stated understanding of all instructions; and was in agreement with this plan of care. The patient knows to call the clinic with any problems, questions or concerns.   Total time spent with patient was 25 minutes;  with greater than 75 percent of that time spent in face to face counseling regarding patient's symptoms,  and coordination of care and follow  up.  Disclaimer:This dictation was prepared with Dragon/digital dictation along with [employer]. Any transcriptional errors that result from this process are unintentional.  Drue Second, NP 09/03/2016

## 2016-09-03 NOTE — Assessment & Plan Note (Signed)
Patient received cycle one of his carboplatin/Alimta/Avastin chemotherapy on 08/07/2016.  Patient also is currently undergoing palliative radiation as well; with his final radiation treatment.  Scheduled for 09/24/2016.  Due to significant side effects from his first cycle of chemotherapy-Dr. Julien Nordmann made the decision to hold any further chemotherapy until patient has completed all of his radiation treatments.  Patient was given a refill of Hycodan cough syrup per his request today.  Agent is scheduled to return for labs, visit, and his next cycle of chemotherapy on 09/18/2016.

## 2016-09-03 NOTE — Assessment & Plan Note (Signed)
Patient has a history of hypertension and cardiac issues.  He takes Cardizem, CD 180 mg daily, lisinopril 20 mg at night, and Toprol-XL 100 mg every morning.  As directed per his primary care physician Dr. Kathryne Eriksson.  Patient states that he has had decreased appetite and very poor oral intake recently.  He feels mildly dehydrated today.  While patient was in radiation oncology this afternoon.  It was noted that his blood pressure was as low as 76/53 with a heart rate of 134.  Patient was afebrile time with temperature 97.8.  Patient stated he also felt lightheaded when he changes positions quickly.  Also of note-patient takes MS Contin 30 mg as well.  He is prescribed MS Contin to be taken twice daily; but states he only takes the MS Contin at night because it makes him too sleepy.  Exam today-patient appeared fatigued and slightly weak; but neurologically intact.  He did appear mildly dehydrated.  Sodium levels 135.  Blood counts were essentially stable.  Patient will receive IV fluid rehydration today and we'll recheck blood pressure after IV fluids.  Patient will continue to check his blood pressure on a daily basis.  If patient's blood pressure remains low.-We'll need to consider adjusting patient's cardiac and hypertension medications as needed.  Patient was advised to call/return of electrical to the emergency department for any worsening symptoms whatsoever.

## 2016-09-04 ENCOUNTER — Ambulatory Visit
Admission: RE | Admit: 2016-09-04 | Discharge: 2016-09-04 | Disposition: A | Discharge status: Home / Self Care | Payer: Medicare Other | Source: Ambulatory Visit | Attending: Radiation Oncology | Admitting: Radiation Oncology

## 2016-09-04 ENCOUNTER — Other Ambulatory Visit: Payer: Medicare Other

## 2016-09-04 DIAGNOSIS — C3490 Malignant neoplasm of unspecified part of unspecified bronchus or lung: Secondary | ICD-10-CM | POA: Diagnosis not present

## 2016-09-05 ENCOUNTER — Telehealth: Payer: Self-pay

## 2016-09-05 ENCOUNTER — Ambulatory Visit: Payer: Medicare Other

## 2016-09-05 NOTE — Telephone Encounter (Signed)
Pt called stating he could not make today's XRT appt, he is too weak. Offered [facility] visit and he declined stating he just feels like he needs some rest. No visit seen on chart but pt has schedule at home with a treatment today.

## 2016-09-08 ENCOUNTER — Ambulatory Visit: Payer: Medicare Other

## 2016-09-09 ENCOUNTER — Telehealth: Payer: Self-pay | Admitting: Medical Oncology

## 2016-09-09 ENCOUNTER — Encounter: Payer: Self-pay | Admitting: Radiation Oncology

## 2016-09-09 ENCOUNTER — Other Ambulatory Visit: Payer: Self-pay | Admitting: Medical Oncology

## 2016-09-09 ENCOUNTER — Ambulatory Visit
Admission: RE | Admit: 2016-09-09 | Discharge: 2016-09-09 | Disposition: A | Discharge status: Home / Self Care | Payer: Medicare Other | Source: Ambulatory Visit | Attending: Radiation Oncology | Admitting: Radiation Oncology

## 2016-09-09 ENCOUNTER — Ambulatory Visit: Admission: RE | Admit: 2016-09-09 | Payer: Medicare Other | Source: Ambulatory Visit

## 2016-09-09 ENCOUNTER — Ambulatory Visit (HOSPITAL_BASED_OUTPATIENT_CLINIC_OR_DEPARTMENT_OTHER): Payer: Medicare Other

## 2016-09-09 ENCOUNTER — Telehealth: Payer: Self-pay | Admitting: Internal Medicine

## 2016-09-09 VITALS — BP 100/55 | HR 100 | Temp 97.6°F | Ht 70.0 in | Wt 145.0 lb

## 2016-09-09 DIAGNOSIS — C3411 Malignant neoplasm of upper lobe, right bronchus or lung: Secondary | ICD-10-CM | POA: Diagnosis not present

## 2016-09-09 DIAGNOSIS — C3410 Malignant neoplasm of upper lobe, unspecified bronchus or lung: Secondary | ICD-10-CM

## 2016-09-09 LAB — COMPREHENSIVE METABOLIC PANEL
ALT: 33 U/L (ref 0–55)
ANION GAP: 13 meq/L — AB (ref 3–11)
AST: 51 U/L — ABNORMAL HIGH (ref 5–34)
Albumin: 2.5 g/dL — ABNORMAL LOW (ref 3.5–5.0)
Alkaline Phosphatase: 85 U/L (ref 40–150)
BILIRUBIN TOTAL: 0.65 mg/dL (ref 0.20–1.20)
BUN: 27.4 mg/dL — ABNORMAL HIGH (ref 7.0–26.0)
CALCIUM: 8.8 mg/dL (ref 8.4–10.4)
CHLORIDE: 97 meq/L — AB (ref 98–109)
CO2: 19 meq/L — AB (ref 22–29)
CREATININE: 1.4 mg/dL — AB (ref 0.7–1.3)
EGFR: 50 mL/min/{1.73_m2} — AB (ref >90)
Glucose: 127 mg/dl (ref 70–140)
Potassium: 4.2 mEq/L (ref 3.5–5.1)
Sodium: 129 mEq/L — ABNORMAL LOW (ref 136–145)
Total Protein: 6.2 g/dL — ABNORMAL LOW (ref 6.4–8.3)

## 2016-09-09 LAB — CBC WITH DIFFERENTIAL/PLATELET
BASO%: 0.4 % (ref 0.0–2.0)
BASOS ABS: 0 10*3/uL (ref 0.0–0.1)
EOS ABS: 0 10*3/uL (ref 0.0–0.5)
EOS%: 0 % (ref 0.0–7.0)
HEMATOCRIT: 28.9 % — AB (ref 38.4–49.9)
HGB: 9.6 g/dL — ABNORMAL LOW (ref 13.0–17.1)
LYMPH%: 10.7 % — AB (ref 14.0–49.0)
MCH: 30 pg (ref 27.2–33.4)
MCHC: 33.3 g/dL (ref 32.0–36.0)
MCV: 90.1 fL (ref 79.3–98.0)
MONO#: 0.5 10*3/uL (ref 0.1–0.9)
MONO%: 7.6 % (ref 0.0–14.0)
NEUT#: 5.1 10*3/uL (ref 1.5–6.5)
NEUT%: 81.3 % — AB (ref 39.0–75.0)
PLATELETS: 171 10*3/uL (ref 140–400)
RBC: 3.21 10*6/uL — AB (ref 4.20–5.82)
RDW: 13.9 % (ref 11.0–14.6)
WBC: 6.3 10*3/uL (ref 4.0–10.3)
lymph#: 0.7 10*3/uL — ABNORMAL LOW (ref 0.9–3.3)

## 2016-09-09 NOTE — Progress Notes (Signed)
  Radiation Oncology         [phone] ________________________________  Name: Donald Delacruz MRN: [id number]  Date: 09/09/2016  DOB: 07-12-1940  Weekly Radiation Therapy Management    ICD-9-CM ICD-10-CM   1. Malignant neoplasm of upper lobe of right lung (HCC) 162.3 C34.11      Current Dose: 12 Gy     Planned Dose:  40 Gy  Narrative . . . . . . . . The patient presents for routine under treatment assessment.                                  The patient has completed 6/20 fractions to the chest, and is seen today before his next treatment. He is accompanied by his wife and son today. He reports feeling weak and is very fatigued with activity. He reports a frequent cough with yellow sputum. He is alternating hycodan with tessalon for the cough. He reports shortness of breath. The patient reports a poor appetite, and has been trying to drink more liquids. The patient has lost 6 lbs since 09/02/16. The patient missed 2 radiation treatments due to not feeling well. The patient's son questions if the patient should continue with radiation and chemotherapy.                                  Set-up films were reviewed.                                 The chart was checked. Physical Findings. . .  height is '5\' 10"'$  (1.778 m) and weight is 145 lb (65.8 kg). His oral temperature is 97.6 F (36.4 C). His blood pressure is 100/55 (abnormal) and his pulse is 100. His oxygen saturation is 95%.  Weight loss noted. No signs of thrush. Lungs are clear to auscultation bilaterally. Heart regular in rate and rhythm. In wheelchair. Impression . . . . . . . The patient is tolerating radiation. Poor fluid intake, and possible dehydration.  Plan . . . . . . . . . . . . The patient was seen before his radiation treatment today, as he has been feeling very weak. I'm not sure he wants to continue with treatment. The patient will not proceed with treatment today. He will receive IV fluids for the next three days. He will  meet with Dr. Julien Nordmann and consider his options, and let us know if he wants to proceed with treatment or  with palliative care. ________________________________   Blair Promise, PhD, MD  This document serves as a record of services personally performed by Gery Pray, MD. It was created on his behalf by Maryla Morrow, a trained medical scribe. The creation of this record is based on the scribe's personal observations and the provider's statements to them. This document has been checked and approved by the attending provider.

## 2016-09-09 NOTE — Telephone Encounter (Signed)
"   pt continues to decline-he is  weak and not eating or drinking" -has missed 1-2 radiation appts-son is wondering if pt is at the point of needing hospice. Request labs today -prior to seeing [place]. Schedule request sent for labs before XRT.

## 2016-09-09 NOTE — Telephone Encounter (Signed)
sw [name] to confirm ivf appts for 3/14, 3/15, and 3/16 per lOS

## 2016-09-09 NOTE — Progress Notes (Addendum)
[place] has completed 6 fractions to his chest.  He reports feeling weak and reports he is very fatigued with any activity.  He reports having a frequent cough with yellow sputum.  He is taking hycodan alternating with tessalon for the cough.  He reports having shortness of breath.  He reports having a poor appetite and has been trying to drink more liquids.  He missed the last 2 radiation due to not feeling well.  He has lost 6 lbs since 09/02/16.  Orthostatic vitals taken: bp sitting 100/55, hr 100, bp standing 69/52, standing 105.  He is taking metoprol, lisinopril and caridzem.  BP (!) 100/55 (BP Location: Right Arm, Patient Position: Sitting)   Pulse 100   Temp 97.6 F (36.4 C) (Oral)   Ht '5\' 10"'$  (1.778 m)   Wt 145 lb (65.8 kg)   SpO2 95%   BMI 20.81 kg/m    Wt Readings from Last 3 Encounters:  09/09/16 145 lb (65.8 kg)  09/02/16 151 lb 12.8 oz (68.9 kg)  09/02/16 151 lb 1.6 oz (68.5 kg)

## 2016-09-10 ENCOUNTER — Ambulatory Visit (HOSPITAL_BASED_OUTPATIENT_CLINIC_OR_DEPARTMENT_OTHER): Payer: Medicare Other

## 2016-09-10 ENCOUNTER — Other Ambulatory Visit: Payer: Medicare Other

## 2016-09-10 ENCOUNTER — Ambulatory Visit: Payer: Medicare Other

## 2016-09-10 ENCOUNTER — Other Ambulatory Visit: Payer: Self-pay | Admitting: Internal Medicine

## 2016-09-10 ENCOUNTER — Other Ambulatory Visit: Payer: Self-pay | Admitting: *Deleted

## 2016-09-10 VITALS — BP 117/67 | HR 106 | Temp 98.6°F | Resp 18

## 2016-09-10 DIAGNOSIS — C3410 Malignant neoplasm of upper lobe, unspecified bronchus or lung: Secondary | ICD-10-CM

## 2016-09-10 DIAGNOSIS — E860 Dehydration: Secondary | ICD-10-CM

## 2016-09-10 DIAGNOSIS — C3411 Malignant neoplasm of upper lobe, right bronchus or lung: Secondary | ICD-10-CM

## 2016-09-10 MED ORDER — SODIUM CHLORIDE 0.9 % IV SOLN
Freq: Once | INTRAVENOUS | Status: AC
  Administered 2016-09-10: 12:00:00 via INTRAVENOUS

## 2016-09-10 NOTE — Patient Instructions (Signed)
Dehydration, Adult Dehydration is a condition in which there is not enough fluid or water in the body. This happens when you lose more fluids than you take in. Important organs, such as the kidneys, brain, and heart, cannot function without a proper amount of fluids. Any loss of fluids from the body can lead to dehydration. Dehydration can range from mild to severe. This condition should be treated right away to prevent it from becoming severe. What are the causes? This condition may be caused by:  Vomiting.  Diarrhea.  Excessive sweating, such as from heat exposure or exercise.  Not drinking enough fluid, especially:  When ill.  While doing activity that requires a lot of energy.  Excessive urination.  Fever.  Infection.  Certain medicines, such as medicines that cause the body to lose excess fluid (diuretics).  Inability to access safe drinking water.  Reduced physical ability to get adequate water and food. What increases the risk? This condition is more likely to develop in people:  Who have a poorly controlled long-term (chronic) illness, such as diabetes, heart disease, or kidney disease.  Who are age 65 or older.  Who are disabled.  Who live in a place with high altitude.  Who play endurance sports. What are the signs or symptoms? Symptoms of mild dehydration may include:   Thirst.  Dry lips.  Slightly dry mouth.  Dry, warm skin.  Dizziness. Symptoms of moderate dehydration may include:   Very dry mouth.  Muscle cramps.  Dark urine. Urine may be the color of tea.  Decreased urine production.  Decreased tear production.  Heartbeat that is irregular or faster than normal (palpitations).  Headache.  Light-headedness, especially when you stand up from a sitting position.  Fainting (syncope). Symptoms of severe dehydration may include:   Changes in skin, such as:  Cold and clammy skin.  Blotchy (mottled) or pale skin.  Skin that does  not quickly return to normal after being lightly pinched and released (poor skin turgor).  Changes in body fluids, such as:  Extreme thirst.  No tear production.  Inability to sweat when body temperature is high, such as in hot weather.  Very little urine production.  Changes in vital signs, such as:  Weak pulse.  Pulse that is more than 100 beats a minute when sitting still.  Rapid breathing.  Low blood pressure.  Other changes, such as:  Sunken eyes.  Cold hands and feet.  Confusion.  Lack of energy (lethargy).  Difficulty waking up from sleep.  Short-term weight loss.  Unconsciousness. How is this diagnosed? This condition is diagnosed based on your symptoms and a physical exam. Blood and urine tests may be done to help confirm the diagnosis. How is this treated? Treatment for this condition depends on the severity. Mild or moderate dehydration can often be treated at home. Treatment should be started right away. Do not wait until dehydration becomes severe. Severe dehydration is an emergency and it needs to be treated in a hospital. Treatment for mild dehydration may include:   Drinking more fluids.  Replacing salts and minerals in your blood (electrolytes) that you may have lost. Treatment for moderate dehydration may include:   Drinking an oral rehydration solution (ORS). This is a drink that helps you replace fluids and electrolytes (rehydrate). It can be found at pharmacies and retail stores. Treatment for severe dehydration may include:   Receiving fluids through an IV tube.  Receiving an electrolyte solution through a feeding tube that is   passed through your nose and into your stomach (nasogastric tube, or NG tube).  Correcting any abnormalities in electrolytes.  Treating the underlying cause of dehydration. Follow these instructions at home:  If directed by your health care provider, drink an ORS:  Make an ORS by following instructions on the  package.  Start by drinking small amounts, about  cup (120 mL) every 5-10 minutes.  Slowly increase how much you drink until you have taken the amount recommended by your health care provider.  Drink enough clear fluid to keep your urine clear or pale yellow. If you were told to drink an ORS, finish the ORS first, then start slowly drinking other clear fluids. Drink fluids such as:  Water. Do not drink only water. Doing that can lead to having too little salt (sodium) in the body (hyponatremia).  Ice chips.  Fruit juice that you have added water to (diluted fruit juice).  Low-calorie sports drinks.  Avoid:  Alcohol.  Drinks that contain a lot of sugar. These include high-calorie sports drinks, fruit juice that is not diluted, and soda.  Caffeine.  Foods that are greasy or contain a lot of fat or sugar.  Take over-the-counter and prescription medicines only as told by your health care provider.  Do not take sodium tablets. This can lead to having too much sodium in the body (hypernatremia).  Eat foods that contain a healthy balance of electrolytes, such as bananas, oranges, potatoes, tomatoes, and spinach.  Keep all follow-up visits as told by your health care provider. This is important. Contact a health care provider if:  You have abdominal pain that:  Gets worse.  Stays in one area (localizes).  You have a rash.  You have a stiff neck.  You are more irritable than usual.  You are sleepier or more difficult to wake up than usual.  You feel weak or dizzy.  You feel very thirsty.  You have urinated only a small amount of very dark urine over 6-8 hours. Get help right away if:  You have symptoms of severe dehydration.  You cannot drink fluids without vomiting.  Your symptoms get worse with treatment.  You have a fever.  You have a severe headache.  You have vomiting or diarrhea that:  Gets worse.  Does not go away.  You have blood or green matter  (bile) in your vomit.  You have blood in your stool. This may cause stool to look black and tarry.  You have not urinated in 6-8 hours.  You faint.  Your heart rate while sitting still is over 100 beats a minute.  You have trouble breathing. This information is not intended to replace advice given to you by your health care provider. Make sure you discuss any questions you have with your health care provider. Document Released: 06/16/2005 Document Revised: 01/11/2016 Document Reviewed: 08/10/2015 Elsevier Interactive Patient Education  2017 [employer]  

## 2016-09-11 ENCOUNTER — Ambulatory Visit (HOSPITAL_BASED_OUTPATIENT_CLINIC_OR_DEPARTMENT_OTHER): Payer: Medicare Other | Admitting: Nurse Practitioner

## 2016-09-11 ENCOUNTER — Telehealth: Payer: Self-pay | Admitting: Medical Oncology

## 2016-09-11 ENCOUNTER — Other Ambulatory Visit: Payer: Medicare Other

## 2016-09-11 ENCOUNTER — Ambulatory Visit: Payer: Medicare Other

## 2016-09-11 VITALS — BP 119/79 | HR 125 | Temp 98.3°F | Resp 24

## 2016-09-11 DIAGNOSIS — E860 Dehydration: Secondary | ICD-10-CM

## 2016-09-11 DIAGNOSIS — C3411 Malignant neoplasm of upper lobe, right bronchus or lung: Secondary | ICD-10-CM

## 2016-09-11 MED ORDER — ONDANSETRON HCL 4 MG/2ML IJ SOLN: 8.0000 mg | Freq: Once | INTRAMUSCULAR | Status: DC

## 2016-09-11 MED ORDER — SODIUM CHLORIDE 0.9 % IV SOLN
Freq: Once | INTRAVENOUS | Status: AC
  Administered 2016-09-11: 15:00:00 via INTRAVENOUS

## 2016-09-11 MED ORDER — SODIUM CHLORIDE 0.9 % IV SOLN: Freq: Once | INTRAVENOUS | Status: DC

## 2016-09-11 NOTE — Patient Instructions (Signed)
Dehydration, Adult Dehydration is a condition in which there is not enough fluid or water in the body. This happens when you lose more fluids than you take in. Important organs, such as the kidneys, brain, and heart, cannot function without a proper amount of fluids. Any loss of fluids from the body can lead to dehydration. Dehydration can range from mild to severe. This condition should be treated right away to prevent it from becoming severe. What are the causes? This condition may be caused by:  Vomiting.  Diarrhea.  Excessive sweating, such as from heat exposure or exercise.  Not drinking enough fluid, especially:  When ill.  While doing activity that requires a lot of energy.  Excessive urination.  Fever.  Infection.  Certain medicines, such as medicines that cause the body to lose excess fluid (diuretics).  Inability to access safe drinking water.  Reduced physical ability to get adequate water and food. What increases the risk? This condition is more likely to develop in people:  Who have a poorly controlled long-term (chronic) illness, such as diabetes, heart disease, or kidney disease.  Who are age 65 or older.  Who are disabled.  Who live in a place with high altitude.  Who play endurance sports. What are the signs or symptoms? Symptoms of mild dehydration may include:   Thirst.  Dry lips.  Slightly dry mouth.  Dry, warm skin.  Dizziness. Symptoms of moderate dehydration may include:   Very dry mouth.  Muscle cramps.  Dark urine. Urine may be the color of tea.  Decreased urine production.  Decreased tear production.  Heartbeat that is irregular or faster than normal (palpitations).  Headache.  Light-headedness, especially when you stand up from a sitting position.  Fainting (syncope). Symptoms of severe dehydration may include:   Changes in skin, such as:  Cold and clammy skin.  Blotchy (mottled) or pale skin.  Skin that does  not quickly return to normal after being lightly pinched and released (poor skin turgor).  Changes in body fluids, such as:  Extreme thirst.  No tear production.  Inability to sweat when body temperature is high, such as in hot weather.  Very little urine production.  Changes in vital signs, such as:  Weak pulse.  Pulse that is more than 100 beats a minute when sitting still.  Rapid breathing.  Low blood pressure.  Other changes, such as:  Sunken eyes.  Cold hands and feet.  Confusion.  Lack of energy (lethargy).  Difficulty waking up from sleep.  Short-term weight loss.  Unconsciousness. How is this diagnosed? This condition is diagnosed based on your symptoms and a physical exam. Blood and urine tests may be done to help confirm the diagnosis. How is this treated? Treatment for this condition depends on the severity. Mild or moderate dehydration can often be treated at home. Treatment should be started right away. Do not wait until dehydration becomes severe. Severe dehydration is an emergency and it needs to be treated in a hospital. Treatment for mild dehydration may include:   Drinking more fluids.  Replacing salts and minerals in your blood (electrolytes) that you may have lost. Treatment for moderate dehydration may include:   Drinking an oral rehydration solution (ORS). This is a drink that helps you replace fluids and electrolytes (rehydrate). It can be found at pharmacies and retail stores. Treatment for severe dehydration may include:   Receiving fluids through an IV tube.  Receiving an electrolyte solution through a feeding tube that is   passed through your nose and into your stomach (nasogastric tube, or NG tube).  Correcting any abnormalities in electrolytes.  Treating the underlying cause of dehydration. Follow these instructions at home:  If directed by your health care provider, drink an ORS:  Make an ORS by following instructions on the  package.  Start by drinking small amounts, about  cup (120 mL) every 5-10 minutes.  Slowly increase how much you drink until you have taken the amount recommended by your health care provider.  Drink enough clear fluid to keep your urine clear or pale yellow. If you were told to drink an ORS, finish the ORS first, then start slowly drinking other clear fluids. Drink fluids such as:  Water. Do not drink only water. Doing that can lead to having too little salt (sodium) in the body (hyponatremia).  Ice chips.  Fruit juice that you have added water to (diluted fruit juice).  Low-calorie sports drinks.  Avoid:  Alcohol.  Drinks that contain a lot of sugar. These include high-calorie sports drinks, fruit juice that is not diluted, and soda.  Caffeine.  Foods that are greasy or contain a lot of fat or sugar.  Take over-the-counter and prescription medicines only as told by your health care provider.  Do not take sodium tablets. This can lead to having too much sodium in the body (hypernatremia).  Eat foods that contain a healthy balance of electrolytes, such as bananas, oranges, potatoes, tomatoes, and spinach.  Keep all follow-up visits as told by your health care provider. This is important. Contact a health care provider if:  You have abdominal pain that:  Gets worse.  Stays in one area (localizes).  You have a rash.  You have a stiff neck.  You are more irritable than usual.  You are sleepier or more difficult to wake up than usual.  You feel weak or dizzy.  You feel very thirsty.  You have urinated only a small amount of very dark urine over 6-8 hours. Get help right away if:  You have symptoms of severe dehydration.  You cannot drink fluids without vomiting.  Your symptoms get worse with treatment.  You have a fever.  You have a severe headache.  You have vomiting or diarrhea that:  Gets worse.  Does not go away.  You have blood or green matter  (bile) in your vomit.  You have blood in your stool. This may cause stool to look black and tarry.  You have not urinated in 6-8 hours.  You faint.  Your heart rate while sitting still is over 100 beats a minute.  You have trouble breathing. This information is not intended to replace advice given to you by your health care provider. Make sure you discuss any questions you have with your health care provider. Document Released: 06/16/2005 Document Revised: 01/11/2016 Document Reviewed: 08/10/2015 Elsevier Interactive Patient Education  2017 [employer]  

## 2016-09-11 NOTE — Telephone Encounter (Signed)
Called palliative care referral after talking with son who requested service. Son also asking if CT chest will be done soon-his 2nd tx is next week.

## 2016-09-11 NOTE — Progress Notes (Signed)
@  1635 Remains tachycardic. BP improved after IV fluids. Abelina Bachelor, RN for Dr. Julien Nordmann working on home 02 for patient. This is to be delivered to cancer center this afternoon.   Will keep patient in [facility] until 02 arrives.  Son in ED with pt's wife, who is being admitted overnight.  Pt aware of appt tomorrow at noon for additional IV fluids.  [place] delivered 02 @ 1829. Son able to take pt home with 02.

## 2016-09-11 NOTE — Progress Notes (Signed)
Tested for home oxygen -pt with sob, cough , unable to ambulate oxygen levels sent to [facility].oxygen 2 liters applied and o2 up to 95%. faxed oxygen sat to [facility] and order for oxygen at home. They will bring oxygen to cancer center for pt and contact son.

## 2016-09-11 NOTE — Telephone Encounter (Signed)
Oxygen orders sent to [facility] for oxygen.

## 2016-09-12 ENCOUNTER — Other Ambulatory Visit (HOSPITAL_COMMUNITY): Payer: Self-pay

## 2016-09-12 ENCOUNTER — Emergency Department (HOSPITAL_COMMUNITY): Payer: Medicare Other

## 2016-09-12 ENCOUNTER — Telehealth: Payer: Self-pay | Admitting: *Deleted

## 2016-09-12 ENCOUNTER — Inpatient Hospital Stay (HOSPITAL_COMMUNITY)
Admission: EM | Admit: 2016-09-12 | Discharge: 2016-09-16 | DRG: 871 | Disposition: A | Discharge status: Skilled Nursing Facility | Payer: Medicare Other | Attending: Family Medicine | Admitting: Family Medicine

## 2016-09-12 ENCOUNTER — Ambulatory Visit: Payer: Medicare Other

## 2016-09-12 ENCOUNTER — Encounter (HOSPITAL_COMMUNITY): Payer: Self-pay | Admitting: Emergency Medicine

## 2016-09-12 ENCOUNTER — Ambulatory Visit: Payer: Medicare Other | Admitting: Nurse Practitioner

## 2016-09-12 ENCOUNTER — Other Ambulatory Visit: Payer: Self-pay

## 2016-09-12 DIAGNOSIS — R0602 Shortness of breath: Secondary | ICD-10-CM | POA: Diagnosis present

## 2016-09-12 DIAGNOSIS — J189 Pneumonia, unspecified organism: Secondary | ICD-10-CM | POA: Diagnosis not present

## 2016-09-12 DIAGNOSIS — Z791 Long term (current) use of non-steroidal anti-inflammatories (NSAID): Secondary | ICD-10-CM | POA: Diagnosis not present

## 2016-09-12 DIAGNOSIS — C3401 Malignant neoplasm of right main bronchus: Secondary | ICD-10-CM | POA: Diagnosis not present

## 2016-09-12 DIAGNOSIS — Z6821 Body mass index (BMI) 21.0-21.9, adult: Secondary | ICD-10-CM

## 2016-09-12 DIAGNOSIS — A419 Sepsis, unspecified organism: Principal | ICD-10-CM | POA: Diagnosis present

## 2016-09-12 DIAGNOSIS — J111 Influenza due to unidentified influenza virus with other respiratory manifestations: Secondary | ICD-10-CM | POA: Diagnosis not present

## 2016-09-12 DIAGNOSIS — J101 Influenza due to other identified influenza virus with other respiratory manifestations: Secondary | ICD-10-CM | POA: Diagnosis present

## 2016-09-12 DIAGNOSIS — G2581 Restless legs syndrome: Secondary | ICD-10-CM | POA: Diagnosis present

## 2016-09-12 DIAGNOSIS — J690 Pneumonitis due to inhalation of food and vomit: Secondary | ICD-10-CM | POA: Diagnosis present

## 2016-09-12 DIAGNOSIS — D61818 Other pancytopenia: Secondary | ICD-10-CM | POA: Diagnosis present

## 2016-09-12 DIAGNOSIS — Z7189 Other specified counseling: Secondary | ICD-10-CM | POA: Diagnosis not present

## 2016-09-12 DIAGNOSIS — Z801 Family history of malignant neoplasm of trachea, bronchus and lung: Secondary | ICD-10-CM | POA: Diagnosis not present

## 2016-09-12 DIAGNOSIS — G893 Neoplasm related pain (acute) (chronic): Secondary | ICD-10-CM | POA: Diagnosis present

## 2016-09-12 DIAGNOSIS — J181 Lobar pneumonia, unspecified organism: Secondary | ICD-10-CM | POA: Diagnosis not present

## 2016-09-12 DIAGNOSIS — C3411 Malignant neoplasm of upper lobe, right bronchus or lung: Secondary | ICD-10-CM | POA: Diagnosis present

## 2016-09-12 DIAGNOSIS — I959 Hypotension, unspecified: Secondary | ICD-10-CM

## 2016-09-12 DIAGNOSIS — C7951 Secondary malignant neoplasm of bone: Secondary | ICD-10-CM | POA: Diagnosis present

## 2016-09-12 DIAGNOSIS — C3410 Malignant neoplasm of upper lobe, unspecified bronchus or lung: Secondary | ICD-10-CM | POA: Diagnosis not present

## 2016-09-12 DIAGNOSIS — E43 Unspecified severe protein-calorie malnutrition: Secondary | ICD-10-CM | POA: Diagnosis present

## 2016-09-12 DIAGNOSIS — Z923 Personal history of irradiation: Secondary | ICD-10-CM

## 2016-09-12 DIAGNOSIS — J90 Pleural effusion, not elsewhere classified: Secondary | ICD-10-CM | POA: Diagnosis not present

## 2016-09-12 DIAGNOSIS — R7989 Other specified abnormal findings of blood chemistry: Secondary | ICD-10-CM

## 2016-09-12 DIAGNOSIS — Z9221 Personal history of antineoplastic chemotherapy: Secondary | ICD-10-CM

## 2016-09-12 DIAGNOSIS — Z515 Encounter for palliative care: Secondary | ICD-10-CM | POA: Diagnosis not present

## 2016-09-12 DIAGNOSIS — Z79899 Other long term (current) drug therapy: Secondary | ICD-10-CM

## 2016-09-12 DIAGNOSIS — C771 Secondary and unspecified malignant neoplasm of intrathoracic lymph nodes: Secondary | ICD-10-CM | POA: Diagnosis present

## 2016-09-12 DIAGNOSIS — R778 Other specified abnormalities of plasma proteins: Secondary | ICD-10-CM

## 2016-09-12 DIAGNOSIS — I10 Essential (primary) hypertension: Secondary | ICD-10-CM | POA: Diagnosis present

## 2016-09-12 DIAGNOSIS — Z87891 Personal history of nicotine dependence: Secondary | ICD-10-CM | POA: Diagnosis not present

## 2016-09-12 DIAGNOSIS — E860 Dehydration: Secondary | ICD-10-CM

## 2016-09-12 DIAGNOSIS — R531 Weakness: Secondary | ICD-10-CM | POA: Diagnosis not present

## 2016-09-12 DIAGNOSIS — R000 Tachycardia, unspecified: Secondary | ICD-10-CM

## 2016-09-12 LAB — COMPREHENSIVE METABOLIC PANEL
ALBUMIN: 2.3 g/dL — AB (ref 3.5–5.0)
ALT: 20 U/L (ref 17–63)
ALT: 19 U/L (ref 17–63)
ANION GAP: 9 (ref 5–15)
ANION GAP: 7 (ref 5–15)
AST: 42 U/L — AB (ref 15–41)
AST: 36 U/L (ref 15–41)
Albumin: 2 g/dL — ABNORMAL LOW (ref 3.5–5.0)
Alkaline Phosphatase: 61 U/L (ref 38–126)
Alkaline Phosphatase: 61 U/L (ref 38–126)
BUN: 22 mg/dL — AB (ref 6–20)
BUN: 20 mg/dL (ref 6–20)
CHLORIDE: 103 mmol/L (ref 101–111)
CHLORIDE: 104 mmol/L (ref 101–111)
CO2: 22 mmol/L (ref 22–32)
CO2: 20 mmol/L — ABNORMAL LOW (ref 22–32)
Calcium: 8.3 mg/dL — ABNORMAL LOW (ref 8.9–10.3)
Calcium: 7.1 mg/dL — ABNORMAL LOW (ref 8.9–10.3)
Creatinine, Ser: 1.16 mg/dL (ref 0.61–1.24)
Creatinine, Ser: 0.96 mg/dL (ref 0.61–1.24)
GFR calc Af Amer: >60 mL/min (ref >60)
GFR calc non Af Amer: 59 mL/min — ABNORMAL LOW (ref >60)
GLUCOSE: 124 mg/dL — AB (ref 65–99)
Glucose, Bld: 184 mg/dL — ABNORMAL HIGH (ref 65–99)
POTASSIUM: 4.8 mmol/L (ref 3.5–5.1)
POTASSIUM: 4.2 mmol/L (ref 3.5–5.1)
SODIUM: 134 mmol/L — AB (ref 135–145)
Sodium: 131 mmol/L — ABNORMAL LOW (ref 135–145)
TOTAL PROTEIN: 4.2 g/dL — AB (ref 6.5–8.1)
Total Bilirubin: 1 mg/dL (ref 0.3–1.2)
Total Bilirubin: 0.8 mg/dL (ref 0.3–1.2)
Total Protein: 5.7 g/dL — ABNORMAL LOW (ref 6.5–8.1)

## 2016-09-12 LAB — CBC WITH DIFFERENTIAL/PLATELET
Basophils Absolute: 0 10*3/uL (ref 0.0–0.1)
Basophils Relative: 0 %
Eosinophils Absolute: 0 10*3/uL (ref 0.0–0.7)
Eosinophils Relative: 0 %
HEMATOCRIT: 29 % — AB (ref 39.0–52.0)
HEMOGLOBIN: 9.6 g/dL — AB (ref 13.0–17.0)
LYMPHS ABS: 0.3 10*3/uL — AB (ref 0.7–4.0)
LYMPHS PCT: 8 %
MCH: 29.2 pg (ref 26.0–34.0)
MCHC: 33.1 g/dL (ref 30.0–36.0)
MCV: 88.1 fL (ref 78.0–100.0)
MONOS PCT: 7 %
Monocytes Absolute: 0.2 10*3/uL (ref 0.1–1.0)
NEUTROS ABS: 2.9 10*3/uL (ref 1.7–7.7)
NEUTROS PCT: 85 %
Platelets: 149 10*3/uL — ABNORMAL LOW (ref 150–400)
RBC: 3.29 MIL/uL — ABNORMAL LOW (ref 4.22–5.81)
RDW: 15.2 % (ref 11.5–15.5)
WBC: 3.4 10*3/uL — ABNORMAL LOW (ref 4.0–10.5)

## 2016-09-12 LAB — CBC
HCT: 23.4 % — ABNORMAL LOW (ref 39.0–52.0)
Hemoglobin: 8 g/dL — ABNORMAL LOW (ref 13.0–17.0)
MCH: 30.4 pg (ref 26.0–34.0)
MCHC: 34.2 g/dL (ref 30.0–36.0)
MCV: 89 fL (ref 78.0–100.0)
PLATELETS: 120 10*3/uL — AB (ref 150–400)
RBC: 2.63 MIL/uL — ABNORMAL LOW (ref 4.22–5.81)
RDW: 15.4 % (ref 11.5–15.5)
WBC: 2.9 10*3/uL — ABNORMAL LOW (ref 4.0–10.5)

## 2016-09-12 LAB — TROPONIN I
Troponin I: 0.73 ng/mL (ref <0.03)
Troponin I: 0.5 ng/mL (ref <0.03)

## 2016-09-12 LAB — INFLUENZA PANEL BY PCR (TYPE A & B)
INFLAPCR: NEGATIVE
INFLBPCR: POSITIVE — AB

## 2016-09-12 LAB — TYPE AND SCREEN
ABO/RH(D): AB POS
Antibody Screen: NEGATIVE

## 2016-09-12 LAB — APTT: APTT: 30 s (ref 24–36)

## 2016-09-12 LAB — I-STAT CG4 LACTIC ACID, ED: Lactic Acid, Venous: 3.55 mmol/L (ref 0.5–1.9)

## 2016-09-12 LAB — PROTIME-INR
INR: 1.27
Prothrombin Time: 16 seconds — ABNORMAL HIGH (ref 11.4–15.2)

## 2016-09-12 LAB — PROCALCITONIN: PROCALCITONIN: 10.41 ng/mL

## 2016-09-12 LAB — LACTIC ACID, PLASMA: LACTIC ACID, VENOUS: 1.6 mmol/L (ref 0.5–1.9)

## 2016-09-12 MED ORDER — SODIUM CHLORIDE 0.9 % IV BOLUS (SEPSIS): 1000.0000 mL | Freq: Once | INTRAVENOUS | Status: DC

## 2016-09-12 MED ORDER — SODIUM CHLORIDE 0.9 % IV SOLN: 1000.0000 mL | Freq: Once | INTRAVENOUS | Status: DC

## 2016-09-12 MED ORDER — DEXTROSE 5 % IV SOLN
1.0000 g | Freq: Once | INTRAVENOUS | Status: AC
  Administered 2016-09-12: 1 g via INTRAVENOUS
  Filled 2016-09-12: qty 10

## 2016-09-12 MED ORDER — IOPAMIDOL (ISOVUE-370) INJECTION 76%
100.0000 mL | Freq: Once | INTRAVENOUS | Status: AC | PRN
  Administered 2016-09-12: 100 mL via INTRAVENOUS

## 2016-09-12 MED ORDER — MELOXICAM 15 MG PO TABS
15.0000 mg | ORAL_TABLET | Freq: Every day | ORAL | Status: DC
  Administered 2016-09-13 – 2016-09-16 (×4): 15 mg via ORAL
  Filled 2016-09-12 (×4): qty 1

## 2016-09-12 MED ORDER — SODIUM CHLORIDE 0.9 % IV BOLUS (SEPSIS)
1000.0000 mL | Freq: Once | INTRAVENOUS | Status: AC
  Administered 2016-09-12: 1000 mL via INTRAVENOUS

## 2016-09-12 MED ORDER — MORPHINE SULFATE ER 30 MG PO TBCR
30.0000 mg | EXTENDED_RELEASE_TABLET | Freq: Two times a day (BID) | ORAL | Status: DC
  Administered 2016-09-14 – 2016-09-16 (×5): 30 mg via ORAL
  Filled 2016-09-12 (×7): qty 1

## 2016-09-12 MED ORDER — ONDANSETRON HCL 4 MG/2ML IJ SOLN: 8.0000 mg | Freq: Once | INTRAMUSCULAR | Status: DC

## 2016-09-12 MED ORDER — ACETAMINOPHEN 500 MG PO TABS
1000.0000 mg | ORAL_TABLET | Freq: Once | ORAL | Status: AC
  Administered 2016-09-12: 1000 mg via ORAL
  Filled 2016-09-12: qty 2

## 2016-09-12 MED ORDER — SODIUM CHLORIDE 0.9 % IV BOLUS (SEPSIS)
2000.0000 mL | Freq: Once | INTRAVENOUS | Status: AC
  Administered 2016-09-12: 2000 mL via INTRAVENOUS

## 2016-09-12 MED ORDER — FERROUS SULFATE 325 (65 FE) MG PO TABS
325.0000 mg | ORAL_TABLET | Freq: Every day | ORAL | Status: DC
  Administered 2016-09-13 – 2016-09-16 (×4): 325 mg via ORAL
  Filled 2016-09-12 (×4): qty 1

## 2016-09-12 MED ORDER — OXYCODONE-ACETAMINOPHEN 5-325 MG PO TABS: 1.0000 | ORAL_TABLET | Freq: Four times a day (QID) | ORAL | Status: DC | PRN

## 2016-09-12 MED ORDER — SODIUM CHLORIDE 0.9 % IV SOLN: 1000.0000 mL | INTRAVENOUS | Status: DC

## 2016-09-12 MED ORDER — ACETAMINOPHEN 500 MG PO TABS: 1000.0000 mg | ORAL_TABLET | ORAL | Status: DC | PRN

## 2016-09-12 MED ORDER — ENOXAPARIN SODIUM 30 MG/0.3ML ~~LOC~~ SOLN
30.0000 mg | SUBCUTANEOUS | Status: DC
  Administered 2016-09-12 – 2016-09-15 (×4): 30 mg via SUBCUTANEOUS
  Filled 2016-09-12 (×4): qty 0.3

## 2016-09-12 MED ORDER — DEXTROSE 5 % IV SOLN
500.0000 mg | Freq: Once | INTRAVENOUS | Status: AC
  Administered 2016-09-12: 500 mg via INTRAVENOUS
  Filled 2016-09-12: qty 500

## 2016-09-12 MED ORDER — HYDROCODONE-HOMATROPINE 5-1.5 MG/5ML PO SYRP
5.0000 mL | ORAL_SOLUTION | Freq: Four times a day (QID) | ORAL | Status: DC | PRN
  Administered 2016-09-14 – 2016-09-15 (×2): 5 mL via ORAL
  Filled 2016-09-12 (×2): qty 5

## 2016-09-12 MED ORDER — OSELTAMIVIR PHOSPHATE 75 MG PO CAPS
75.0000 mg | ORAL_CAPSULE | Freq: Once | ORAL | Status: AC
  Administered 2016-09-12: 75 mg via ORAL
  Filled 2016-09-12: qty 1

## 2016-09-12 MED ORDER — OSELTAMIVIR PHOSPHATE 75 MG PO CAPS
75.0000 mg | ORAL_CAPSULE | Freq: Two times a day (BID) | ORAL | Status: DC
  Administered 2016-09-13 – 2016-09-16 (×7): 75 mg via ORAL
  Filled 2016-09-12 (×10): qty 1

## 2016-09-12 MED ORDER — METOPROLOL TARTRATE 5 MG/5ML IV SOLN
2.5000 mg | Freq: Four times a day (QID) | INTRAVENOUS | Status: DC | PRN
  Administered 2016-09-14: 2.5 mg via INTRAVENOUS
  Filled 2016-09-12: qty 5

## 2016-09-12 MED ORDER — ALBUTEROL SULFATE (2.5 MG/3ML) 0.083% IN NEBU
5.0000 mg | INHALATION_SOLUTION | Freq: Once | RESPIRATORY_TRACT | Status: AC
  Administered 2016-09-12: 5 mg via RESPIRATORY_TRACT
  Filled 2016-09-12: qty 6

## 2016-09-12 MED ORDER — IOPAMIDOL (ISOVUE-370) INJECTION 76%
INTRAVENOUS | Status: AC
  Filled 2016-09-12: qty 100

## 2016-09-12 MED ORDER — SODIUM CHLORIDE 0.9 % IV SOLN
INTRAVENOUS | Status: DC
  Administered 2016-09-12 – 2016-09-14 (×6): via INTRAVENOUS

## 2016-09-12 MED ORDER — SODIUM CHLORIDE 0.9 % IV BOLUS (SEPSIS): 250.0000 mL | Freq: Once | INTRAVENOUS | Status: DC

## 2016-09-12 NOTE — H&P (Addendum)
[facility] History and Physical  Prague Shellhammer BXU:[id number] DOB: March 25, 1941 DOA: 09/12/2016  Referring physician: Venora Maples PCP: [name], MD  Specialists: none  Chief Complaint: fever and cough  75 ? metastatic recurrent non-small cell lung cancer initially diagnosed as stage IB (T2a, N0, M0)-August 2016 with disease recurrence in December 2017.  Status RUL with lymph node dissection on 01/30/2015.  high risk prognostic score of 31 and 25% risk of dying from lung cancer in 5 years.  carboplatin / Alimta 500 MG/M2 every 3 weeks. First dose 03/22/2015. Status post 4 cycles.   recently started Systemic chemotherapy with carboplatin for AUC of 5, Alimta 500 MG/M2 and Avastin 15 MG/KG every 3 weeks. First dose 08/13/2016--patient was unable to tolerate further dosing because of side effects of fatigue as well as weakness it was recommended that patient continue with only XRTfor now--bad reaction for 7 days-felt performance was poor 2 weeks pror to this admit--thoguht was to try 3/21 chemo Over the past week ? weakness 5-6 days prior.  Dr Sondra Come saw 3 days ago and decide NOT to cont XRT Had 3 days of IVF 2/2 dehydration and weakness-started on oxygen ~ 2-3 days prior and started needing albuterol and oxygen Rx by oncology-walking 3-4 paces was OOB-placed on 2 l 3/15 Wife was sick 2-3 days ago and admitted to [facility] with FLU  has lost about 20 pounds since the end of December and has been seen on 3/6 and 3/15 2018  Note that patient has been extremely weak over the past 24 hours could not get up and felt dizzy and had a mild fever came to the emergency room as did not feel he would benefit from coming to oncology office yet again  Tmax 101.7 Feeling more weak and SOB-scheduled for home O2-no work-up done at ONcology-hypotensive 70, tachy 120 @ liters IVF given 69/60 Hr 130--->110 Wife diagnosed in ED 3/16 with H flu Flu + in ED   Wbc 3.4 hb 9.6 troponins 0.73 cxrt post surg  changes--? R base infilt  CT chest pending EKG sinus tach pr 0.08, qrs 70 deg, no St-t changes   Past Medical History:  Diagnosis Date  . 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 Cancer ([place])    lung cancer  . 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 Coronary artery calcification seen on computed tomography June 2016   CT scan of chest: Coronary artery calcifications are noted  . Dehydration 08/27/2016  . Dyspnea   . ED (erectile dysfunction)   . Emphysema lung ([place])   . Encounter for antineoplastic chemotherapy 07/31/2016  . Essential hypertension   . Goals of care, counseling/discussion 07/31/2016  . Insomnia   . Lipoma   . Osteoarthritis   . Restless legs syndrome   . Thoracic aortic atherosclerosis [facility]) June 2016   CT scan chest: Atherosclerosis of thoracic aorta is noted without aneurysm or dissection. Visualized portion of upper abdomen demonstrates stable calcified splenic artery and right renal   Past Surgical History:  Procedure Laterality Date  . CATARACT EXTRACTION    . COLONOSCOPY    . PILONIDAL CYST EXCISION    . VIDEO ASSISTED THORACOSCOPY (VATS)/WEDGE RESECTION Right 01/30/2015   Procedure: Right VIDEO ASSISTED THORACOSCOPY, Right upper Lobectomy with lymph node dissection and ONQ insertion;  Surgeon: [name], MD;  Location: [place];  Service: Thoracic;  Laterality: Right;  [name] VIDEO BRONCHOSCOPY N/A 01/30/2015  Procedure: VIDEO BRONCHOSCOPY;  Surgeon: [name], MD;  Location: [facility] OR;  Service: Thoracic;  Laterality: N/A;  . VIDEO BRONCHOSCOPY WITH ENDOBRONCHIAL ULTRASOUND N/A 07/08/2016   Procedure: VIDEO BRONCHOSCOPY WITH ENDOBRONCHIAL ULTRASOUND;  Surgeon: [name], MD;  Location: [place];  Service: Thoracic;  Laterality: N/A;   Social History:  Social History   Social History Narrative  . No narrative on file    Allergies  Allergen Reactions  . Amlodipine Swelling  . Benicar  [Olmesartan]     Dizziness,malaise   . Nisoldipine Swelling    Family History  Problem Relation Age of Onset  . Lung cancer Mother   . Lung cancer Sister     Prior to Admission medications   Medication Sig Start Date End Date Taking? Authorizing Provider  acetaminophen (TYLENOL) 500 MG tablet Take 1,000 mg by mouth every 4 (four) hours as needed for moderate pain or headache.    Yes Historical Provider, MD  albuterol (PROVENTIL HFA;VENTOLIN HFA) 108 (90 Base) MCG/ACT inhaler Inhale 1-2 puffs into the lungs every 6 (six) hours as needed for wheezing or shortness of breath. 08/07/16  Yes Susanne Borders, NP  benzonatate (TESSALON) 100 MG capsule Take 1 capsule (100 mg total) by mouth 3 (three) times daily as needed for cough. 08/21/16  Yes Hayden Pedro, PA-C  dexamethasone (DECADRON) 4 MG tablet 4 mg by mouth twice a day the day before, day of and day after the chemotherapy every 3 weeks 07/31/16  Yes Curt Bears, MD  diltiazem (CARDIZEM CD) 180 MG 24 hr capsule Take 180 mg by mouth every morning.  11/17/12  Yes Historical Provider, MD  ferrous sulfate 325 (65 FE) MG EC tablet Take 325 mg by mouth daily.   Yes Historical Provider, MD  folic acid (FOLVITE) 1 MG tablet Take 1 tablet (1 mg total) by mouth daily. 07/31/16  Yes Curt Bears, MD  gabapentin (NEURONTIN) 300 MG capsule Take 600 mg by mouth at bedtime.  04/23/11  Yes Historical Provider, MD  HYDROcodone-acetaminophen (NORCO/VICODIN) 5-325 MG tablet Take 1 tablet by mouth every 6 (six) hours as needed for moderate pain. 08/18/16  Yes Gery Pray, MD  HYDROcodone-homatropine [facility]) 5-1.5 MG/5ML syrup Take 5 mLs by mouth every 6 (six) hours as needed for cough. 09/02/16  Yes Susanne Borders, NP  lisinopril (PRINIVIL,ZESTRIL) 20 MG tablet Take 20 mg by mouth every morning.  04/23/11  Yes Historical Provider, MD  meloxicam (MOBIC) 15 MG tablet Take 15 mg by mouth daily.   Yes Historical Provider, MD  metoprolol succinate  (TOPROL-XL) 100 MG 24 hr tablet Take 150 mg by mouth every morning.  07/31/11  Yes Historical Provider, MD  mirtazapine (REMERON) 30 MG tablet Take 1 tablet (30 mg total) by mouth at bedtime. 08/27/16  Yes Curt Bears, MD  morphine (MS CONTIN) 30 MG 12 hr tablet Take 1 tablet (30 mg total) by mouth every 12 (twelve) hours. 08/27/16  Yes Curt Bears, MD  Multiple Vitamin (MULTIVITAMIN) capsule Take 1 capsule by mouth every morning.    Yes Historical Provider, MD  Omega-3 Fatty Acids (FISH OIL) 1000 MG CAPS Take 1,000 mg by mouth daily.   Yes Historical Provider, MD  oxyCODONE-acetaminophen (PERCOCET/ROXICET) 5-325 MG tablet Take 1 tablet by mouth every 6 (six) hours as needed for severe pain. 07/31/16  Yes Curt Bears, MD  prochlorperazine (COMPAZINE) 10 MG tablet TAKE 1 TABLET BY MOUTH EVERY 6 HOURS AS NEEDED FOR NAUSEA OR VOMITING 07/31/16  Yes  Curt Bears, MD  traZODone (DESYREL) 100 MG tablet Take 100 mg by mouth at bedtime.   Yes Historical Provider, MD  Turmeric 500 MG CAPS Take 1 capsule by mouth daily.   Yes Historical Provider, MD  vitamin C (ASCORBIC ACID) 500 MG tablet Take 500 mg by mouth daily.   Yes Historical Provider, MD  emollient (BIAFINE) cream Apply topically as needed. Patient not taking: Reported on 09/09/2016 09/02/16   Gery Pray, MD   Physical Exam: Vitals:   09/12/16 1522 09/12/16 1530 09/12/16 1630 09/12/16 1700  BP: 118/81 1'14/61 98/65 98/60 '$  Pulse: (!) 136 (!) 124 (!) 122 (!) 117  Resp: (!) 34 (!) 26 (!) 24 (!) 28  Temp: (!) 101.7 F (38.7 C)     TempSrc: Rectal     SpO2: 96% 92% 96% 97%    alert wasn't frail by temporalis wasting No icterus no pallor No thrush No JVD S1-S2 no murmur rub or gallop Abdomen soft nontender nondistended no lower extremity edema No rash   Labs on Admission:  Basic Metabolic Panel:  Recent Labs Lab 09/09/16 1511 09/12/16 1452  NA 129* 134*  K 4.2 4.8  CL  --  103  CO2 19* 22  GLUCOSE 127 124*  BUN 27.4* 22*   CREATININE 1.4* 1.16  CALCIUM 8.8 8.3*   Liver Function Tests:  Recent Labs Lab 09/09/16 1511 09/12/16 1452  AST 51* 42*  ALT 33 20  ALKPHOS 85 61  BILITOT 0.65 1.0  PROT 6.2* 5.7*  ALBUMIN 2.5* 2.3*   No results for input(s): LIPASE, AMYLASE in the last 168 hours. No results for input(s): AMMONIA in the last 168 hours. CBC:  Recent Labs Lab 09/09/16 1511 09/12/16 1452  WBC 6.3 3.4*  NEUTROABS 5.1 2.9  HGB 9.6* 9.6*  HCT 28.9* 29.0*  MCV 90.1 88.1  PLT 171 149*   Cardiac Enzymes:  Recent Labs Lab 09/12/16 1452  TROPONINI 0.73*    BNP (last 3 results) No results for input(s): BNP in the last 8760 hours.  ProBNP (last 3 results) No results for input(s): PROBNP in the last 8760 hours.  CBG: No results for input(s): GLUCAP in the last 168 hours.  Radiological Exams on Admission: Dg Chest 2 View  Result Date: 09/12/2016 CLINICAL DATA:  Shortness of breath. EXAM: CHEST  2 VIEW COMPARISON:  07/08/2016. FINDINGS: Heart size normal. Postsurgical changes right chest. Mild infiltrate right lung base cannot be excluded. Mild bilateral pleural thickening again noted most likely scarring. IMPRESSION: 1. Postsurgical changes right chest. 2.  Mild right base infiltrate cannot be excluded . Electronically Signed   By: Marcello Moores  Register   On: 09/12/2016 15:55    EKG: Independently reviewed. history as above  Assessment/Plan Active Problems:   Influenza B   1. sepsis secondary toH influenza B with possible superimposedbacterial infection-wife recently diagnosed with facility. Treat with Tamiflu. Start vancomycin and cefepime for suspect that pneumonia causing sepsis. We'll hold Cardizem 180 mg, metoprolol 150 XL a.m. daily as hypotensive-received fluid bolus in the emergency room continue IV saline at 1 25 cc per hour for 12 hours and reassess 2. hypotension-is on Cardizem 180, lisinopril 20, metoprolol 150-these will be narrative when necessary metoprolol IV wish for  heart rate of 120 given only if systolic pressure above 570 3. metastatic non-small cell lung cancer-continue XRT, will need oncology input as an outpatient regarding goals of care-family seems to have an interest in palliative and hospice however may be too early to determine  this. At this time patient would like to be a full code 4. Anemia of malignancy, mild pancytopenia-continue iron supplement, hold vitamin C-hemoglobin is stable in theRangeof 9chief complaint history of 149 and WBC is 3. 4-we will monitor counts in the morning/ 5. elevated troponin-patient has no chest pain whatsoever EKG looks relatively benign. He will cycle troponins-no current workup planned at this juncture unless large increase in troponin 6. weight loss secondary to cancer-hold trazodone, hold Remeron for now 7. Pain secondary to cancer-continue MS Contin 30 mg, meloxicam 50, Percocet 1 tablet every 6 when necessary  full CODE STATUS Admitted to step down as need to monitor hemodynamics with sepsis had a detailed discussion with Dr. Venora Maples about the patient's assessment plan and need for admission No family at bedside Inpatient  CRITICAL CARE Performed by: Nita Sells   Total critical care time: 45 minutes  Critical care time was exclusive of separately billable procedures and treating other patients.  Critical care was necessary to treat or prevent imminent or life-threatening deterioration.  Critical care was time spent personally by me on the following activities: development of treatment plan with patient and/or surrogate as well as nursing, discussions with consultants, evaluation of patient's response to treatment, examination of patient, obtaining history from patient or surrogate, ordering and performing treatments and interventions, ordering and review of laboratory studies, ordering and review of radiographic studies, pulse oximetry and re-evaluation of patient's condition.   Time spent:  [place], [facility] Pager [phone]  If 7PM-7AM, please contact night-coverage [email or web address] Password [facility] 09/12/2016, 6:23 PM

## 2016-09-12 NOTE — ED Notes (Signed)
Called ICU, 20 min timer starts now.

## 2016-09-12 NOTE — ED Provider Notes (Addendum)
[facility] DEPT Provider Note   CSN: [id number] Arrival date & time: 09/12/16  1334     History   Chief Complaint Chief Complaint  Patient presents with  . Weakness  . hypotensive    HPI Donald Delacruz is a 76 y.o. male.  HPI Patient presents today complaining of increasing weakness and shortness of breath.  He has a history of recurrent lung cancer and is currently receiving radiation.  Family is in a significant decline in the past several weeks.  He was started on oxygen several days ago for new hypoxia and recently received IV fluids at the cancer center for severe tachycardia.  Productive cough without reported fever.  His wife was just hospitalized yesterday at [facility] for influenza B.  No reported fever at home.  Denies abdominal pain.  No nausea vomiting or diarrhea.  Denies leg pain.  No history DVT or pulmonary embolism.   Past Medical History:  Diagnosis Date  . 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 Cancer ([place])    lung cancer  . 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 Coronary artery calcification seen on computed tomography June 2016   CT scan of chest: Coronary artery calcifications are noted  . Dehydration 08/27/2016  . Dyspnea   . ED (erectile dysfunction)   . Emphysema lung ([place])   . Encounter for antineoplastic chemotherapy 07/31/2016  . Essential hypertension   . Goals of care, counseling/discussion 07/31/2016  . Insomnia   . Lipoma   . Osteoarthritis   . Restless legs syndrome   . Thoracic aortic atherosclerosis [facility]) June 2016   CT scan chest: Atherosclerosis of thoracic aorta is noted without aneurysm or dissection. Visualized portion of upper abdomen demonstrates stable calcified splenic artery and right renal    Patient Active Problem List   Diagnosis Date Noted  . Dehydration 08/27/2016  . Encounter for antineoplastic chemotherapy 07/31/2016  . Goals of care,  counseling/discussion 07/31/2016  . 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 Malignant neoplasm of upper lobe of right lung ([place]) 12/24/2015  . Deficiency anemia 06/20/2015  . Chemotherapy induced neutropenia ([place]) 06/06/2015  . Lung cancer, right upper lobe 02/02/2015  . S/P lobectomy of lung 01/30/2015  . Pre-operative clearance 01/11/2015  . Hypertension   . Restless leg 12/20/2014  . 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 Fatty tumor 12/20/2014  . 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 Encounter for screening for malignant neoplasm of prostate 12/20/2014  . Chronic otitis externa 12/20/2014  . Abnormal finding on radiology exam 12/19/2014  . Coronary artery calcification seen on computed tomography 11/29/2014    Past Surgical History:  Procedure Laterality Date  . CATARACT EXTRACTION    . COLONOSCOPY    . PILONIDAL CYST EXCISION    . VIDEO ASSISTED THORACOSCOPY (VATS)/WEDGE RESECTION Right 01/30/2015   Procedure: Right VIDEO ASSISTED THORACOSCOPY, Right upper Lobectomy with lymph node dissection and ONQ insertion;  Surgeon: [name], MD;  Location: [facility];  Service: Thoracic;  Laterality: Right;  [name] VIDEO BRONCHOSCOPY N/A 01/30/2015   Procedure: VIDEO BRONCHOSCOPY;  Surgeon: [name], MD;  Location: [facility];  Service: Thoracic;  Laterality: N/A;  . VIDEO BRONCHOSCOPY WITH ENDOBRONCHIAL ULTRASOUND N/A 07/08/2016   Procedure: VIDEO BRONCHOSCOPY WITH ENDOBRONCHIAL ULTRASOUND;  Surgeon: [name], MD;  Location: [facility];  Service: Thoracic;  Laterality: N/A;  Home Medications    Prior to Admission medications   Medication Sig Start Date End Date Taking? Authorizing Provider  acetaminophen (TYLENOL) 500 MG tablet Take 1,000 mg by mouth every 4 (four) hours as needed for moderate pain or headache.    Yes Historical Provider, MD  albuterol (PROVENTIL HFA;VENTOLIN HFA) 108 (90 Base) MCG/ACT  inhaler Inhale 1-2 puffs into the lungs every 6 (six) hours as needed for wheezing or shortness of breath. 08/07/16  Yes Susanne Borders, NP  benzonatate (TESSALON) 100 MG capsule Take 1 capsule (100 mg total) by mouth 3 (three) times daily as needed for cough. 08/21/16  Yes Hayden Pedro, PA-C  dexamethasone (DECADRON) 4 MG tablet 4 mg by mouth twice a day the day before, day of and day after the chemotherapy every 3 weeks 07/31/16  Yes Curt Bears, MD  diltiazem (CARDIZEM CD) 180 MG 24 hr capsule Take 180 mg by mouth every morning.  11/17/12  Yes Historical Provider, MD  ferrous sulfate 325 (65 FE) MG EC tablet Take 325 mg by mouth daily.   Yes Historical Provider, MD  folic acid (FOLVITE) 1 MG tablet Take 1 tablet (1 mg total) by mouth daily. 07/31/16  Yes Curt Bears, MD  gabapentin (NEURONTIN) 300 MG capsule Take 600 mg by mouth at bedtime.  04/23/11  Yes Historical Provider, MD  HYDROcodone-acetaminophen (NORCO/VICODIN) 5-325 MG tablet Take 1 tablet by mouth every 6 (six) hours as needed for moderate pain. 08/18/16  Yes Gery Pray, MD  HYDROcodone-homatropine [facility]) 5-1.5 MG/5ML syrup Take 5 mLs by mouth every 6 (six) hours as needed for cough. 09/02/16  Yes Susanne Borders, NP  lisinopril (PRINIVIL,ZESTRIL) 20 MG tablet Take 20 mg by mouth every morning.  04/23/11  Yes Historical Provider, MD  meloxicam (MOBIC) 15 MG tablet Take 15 mg by mouth daily.   Yes Historical Provider, MD  metoprolol succinate (TOPROL-XL) 100 MG 24 hr tablet Take 150 mg by mouth every morning.  07/31/11  Yes Historical Provider, MD  mirtazapine (REMERON) 30 MG tablet Take 1 tablet (30 mg total) by mouth at bedtime. 08/27/16  Yes Curt Bears, MD  morphine (MS CONTIN) 30 MG 12 hr tablet Take 1 tablet (30 mg total) by mouth every 12 (twelve) hours. 08/27/16  Yes Curt Bears, MD  Multiple Vitamin (MULTIVITAMIN) capsule Take 1 capsule by mouth every morning.    Yes Historical Provider, MD  Omega-3 Fatty Acids  (FISH OIL) 1000 MG CAPS Take 1,000 mg by mouth daily.   Yes Historical Provider, MD  oxyCODONE-acetaminophen (PERCOCET/ROXICET) 5-325 MG tablet Take 1 tablet by mouth every 6 (six) hours as needed for severe pain. 07/31/16  Yes Curt Bears, MD  prochlorperazine (COMPAZINE) 10 MG tablet TAKE 1 TABLET BY MOUTH EVERY 6 HOURS AS NEEDED FOR NAUSEA OR VOMITING 07/31/16  Yes Curt Bears, MD  traZODone (DESYREL) 100 MG tablet Take 100 mg by mouth at bedtime.   Yes Historical Provider, MD  Turmeric 500 MG CAPS Take 1 capsule by mouth daily.   Yes Historical Provider, MD  vitamin C (ASCORBIC ACID) 500 MG tablet Take 500 mg by mouth daily.   Yes Historical Provider, MD  emollient (BIAFINE) cream Apply topically as needed. Patient not taking: Reported on 09/09/2016 09/02/16   Gery Pray, MD    Family History Family History  Problem Relation Age of Onset  . Lung cancer Mother   . Lung cancer Sister     Social History Social History  Substance Use Topics  .  Smoking status: Former Smoker    Packs/day: 1.00    Years: 39.00    Types: Cigarettes    Quit date: 06/30/1997  . Smokeless tobacco: Never Used     Comment: SMOKED PIPES ALSO  . Alcohol use 12.6 oz/week    21 Cans of beer per week     Comment: not drinking currently     Allergies   Amlodipine; Benicar [olmesartan]; and Nisoldipine   Review of Systems Review of Systems  All other systems reviewed and are negative.    Physical Exam Updated Vital Signs BP 98/60   Pulse (!) 117   Temp (!) 101.7 F (38.7 C) (Rectal)   Resp (!) 28   SpO2 97%   Physical Exam  Constitutional: He is oriented to person, place, and time. He appears well-developed and well-nourished.  HENT:  Head: Normocephalic and atraumatic.  Eyes: EOM are normal.  Neck: Normal range of motion.  Cardiovascular: Regular rhythm, normal heart sounds and intact distal pulses.   Tachycardia  Pulmonary/Chest: Effort normal and breath sounds normal. No respiratory  distress.  Abdominal: Soft. He exhibits no distension. There is no tenderness.  Musculoskeletal: Normal range of motion.  Neurological: He is alert and oriented to person, place, and time.  Skin: Skin is warm and dry.  Psychiatric: He has a normal mood and affect. Judgment normal.  Nursing note and vitals reviewed.    ED Treatments / Results  Labs (all labs ordered are listed, but only abnormal results are displayed) Labs Reviewed  CBC WITH DIFFERENTIAL/PLATELET - Abnormal; Notable for the following:       Result Value   WBC 3.4 (*)    RBC 3.29 (*)    Hemoglobin 9.6 (*)    HCT 29.0 (*)    Platelets 149 (*)    Lymphs Abs 0.3 (*)    All other components within normal limits  COMPREHENSIVE METABOLIC PANEL - Abnormal; Notable for the following:    Sodium 134 (*)    Glucose, Bld 124 (*)    BUN 22 (*)    Calcium 8.3 (*)    Total Protein 5.7 (*)    Albumin 2.3 (*)    AST 42 (*)    GFR calc non Af Amer 59 (*)    All other components within normal limits  TROPONIN I - Abnormal; Notable for the following:    Troponin I 0.73 (*)    All other components within normal limits  INFLUENZA PANEL BY PCR (TYPE A & B) - Abnormal; Notable for the following:    Influenza B By PCR POSITIVE (*)    All other components within normal limits  I-STAT CG4 LACTIC ACID, ED - Abnormal; Notable for the following:    Lactic Acid, Venous 3.55 (*)    All other components within normal limits  CULTURE, BLOOD (ROUTINE X 2)  CULTURE, BLOOD (ROUTINE X 2)  URINE CULTURE  URINALYSIS, ROUTINE W REFLEX MICROSCOPIC  I-STAT CG4 LACTIC ACID, ED    EKG ECG interpretation  Date: 09/12/2016  Rate: 114  Rhythm: tachycardia  QRS Axis: normal  Intervals: normal  ST/T Wave abnormalities: normal  Conduction Disutrbances: none  Narrative Interpretation:   Old EKG Reviewed: No significant changes noted     Radiology Dg Chest 2 View  Result Date: 09/12/2016 CLINICAL DATA:  Shortness of breath. EXAM: CHEST   2 VIEW COMPARISON:  07/08/2016. FINDINGS: Heart size normal. Postsurgical changes right chest. Mild infiltrate right lung base cannot be excluded. Mild bilateral  pleural thickening again noted most likely scarring. IMPRESSION: 1. Postsurgical changes right chest. 2.  Mild right base infiltrate cannot be excluded . Electronically Signed   By: Marcello Moores  Register   On: 09/12/2016 15:55    Procedures Procedures (including critical care time)   +++++++++++++++++++++++++++++++++++++++++++++++++  CRITICAL CARE Performed by: Hoy Morn Total critical care time: 35 minutes Critical care time was exclusive of separately billable procedures and treating other patients. Critical care was necessary to treat or prevent imminent or life-threatening deterioration. Critical care was time spent personally by me on the following activities: development of treatment plan with patient and/or surrogate as well as nursing, discussions with consultants, evaluation of patient's response to treatment, examination of patient, obtaining history from patient or surrogate, ordering and performing treatments and interventions, ordering and review of laboratory studies, ordering and review of radiographic studies, pulse oximetry and re-evaluation of patient's condition.  ++++++++++++++++++++++++++++++++++++++++++++     Medications Ordered in ED Medications  cefTRIAXone (ROCEPHIN) 1 g in dextrose 5 % 50 mL IVPB (1 g Intravenous New Bag/Given 09/12/16 1730)  azithromycin (ZITHROMAX) 500 mg in dextrose 5 % 250 mL IVPB (not administered)  iopamidol (ISOVUE-370) 76 % injection (not administered)  sodium chloride 0.9 % bolus 1,000 mL (not administered)  acetaminophen (TYLENOL) tablet 1,000 mg (not administered)  sodium chloride 0.9 % bolus 2,000 mL (2,000 mLs Intravenous New Bag/Given 09/12/16 1526)  albuterol (PROVENTIL) (2.5 MG/3ML) 0.083% nebulizer solution 5 mg (5 mg Nebulization Given 09/12/16 1526)  oseltamivir  (TAMIFLU) capsule 75 mg (75 mg Oral Given 09/12/16 1722)  iopamidol (ISOVUE-370) 76 % injection 100 mL (100 mLs Intravenous Contrast Given 09/12/16 1754)     Initial Impression / Assessment and Plan / ED Course  I have reviewed the triage vital signs and the nursing notes.  Pertinent labs & imaging results that were available during my care of the patient were reviewed by me and considered in my medical decision making (see chart for details).     Patient positive for influenza.  Started on Tamiflu.  Started on azithromycin and Rocephin for possible community acquired pneumonia given right lower lobe infiltrate.  Patient will undergo CT imaging at this time to evaluate for PE as well as better define this area of pneumonia.  Who also helps restage his cancer.  He is hypertensive and tachycardic responding to IV fluids.  Fever treated.  Sepsis.  Lactate elevated.  Patient will be admitted to the triad hospitalist.   Final Clinical Impressions(s) / ED Diagnoses   Final diagnoses:  Community acquired pneumonia of right lower lobe of lung ([place])  Tachycardia  Hypotension, unspecified hypotension type  Influenza B  Elevated troponin    New Prescriptions New Prescriptions   No medications on file     Jola Schmidt, MD 09/12/16 [place], MD 09/12/16 [phone]

## 2016-09-12 NOTE — Telephone Encounter (Signed)
[name] was due here @ [facility] for IVF @ 12noon. Pt not here @ 12:30.  Telephone call made to pt's son, Donald Delacruz.[name]  Spoke with Donald Delacruz. He said his dad is extremely weak, could not even stand up to get his clothes on this am. Pt requested to go to [name] EMS has been called and is at the home right now. Pt's other son, Donald Delacruz, is there with the patient.  Pt's wife remains in the hospital and has been dx'd with the flu. Asked Donald Delacruz if Palliative Care has contacted them.. He states they have. Donald Delacruz now believes that his dad needs full Hospice care. Advised Donald Delacruz that Hospice Liaison is available at [facility] and on the weekends as well. Hospice services and appropriate discharge plans will and can be made while [name] is in the hospital. Donald Delacruz states he is glad to hear this. Both himself and his brother Donald Delacruz live in [place], [place] and are very concerned about ongoing care for both parents.   Emotional support extended.  Donald Bachelor, RN for Dr. Julien Nordmann made aware of pt status.

## 2016-09-12 NOTE — ED Notes (Signed)
Bed: WA02 Expected date: 09/12/16 Expected time: 1:28 PM Means of arrival: Ambulance Comments: Ca pt, hypotensive resolved with bolus

## 2016-09-12 NOTE — ED Triage Notes (Signed)
Patient comes from home for [facility] for weakness.  Patient has lung cancer and been going to cancer center daily for IV fluids.  Patient on home O2.  Patient spouse is sick and can't get patient to center today so called EMS. Patient HR 150 initially but decreased to 130.  patient also hypotensive with EMS.  BP 80/50 then 97/59. Patient had around 1L NS in route via 20g in right AC, 20g in right forearm.

## 2016-09-12 NOTE — Telephone Encounter (Signed)
"  This is Donald Delacruz calling for my Dad Donald Delacruz.  He was for an electrolyte infusion this morning.  He will not be in and we may need to call an ambulance."  Returned call.  "Paramedics are here now.  He's too weak to come in.  We called 911 so he can get to the hospital to be admitted.  I've told EMS about his cancer.  He's going to [facility].  Is there anything I need to make them aware of?"    No the hospital has access to [facility] information.  Wished him well

## 2016-09-13 LAB — URINALYSIS, ROUTINE W REFLEX MICROSCOPIC
BILIRUBIN URINE: NEGATIVE
GLUCOSE, UA: NEGATIVE mg/dL
Hgb urine dipstick: NEGATIVE
KETONES UR: NEGATIVE mg/dL
LEUKOCYTES UA: NEGATIVE
Nitrite: NEGATIVE
PH: 5 (ref 5.0–8.0)
PROTEIN: NEGATIVE mg/dL
Specific Gravity, Urine: >1.046 — ABNORMAL HIGH (ref 1.005–1.030)

## 2016-09-13 LAB — CBC WITH DIFFERENTIAL/PLATELET
Basophils Absolute: 0 10*3/uL (ref 0.0–0.1)
Basophils Relative: 0 %
EOS PCT: 0 %
Eosinophils Absolute: 0 10*3/uL (ref 0.0–0.7)
HCT: 25.5 % — ABNORMAL LOW (ref 39.0–52.0)
HEMOGLOBIN: 8.6 g/dL — AB (ref 13.0–17.0)
LYMPHS ABS: 0.5 10*3/uL — AB (ref 0.7–4.0)
LYMPHS PCT: 13 %
MCH: 30.1 pg (ref 26.0–34.0)
MCHC: 33.7 g/dL (ref 30.0–36.0)
MCV: 89.2 fL (ref 78.0–100.0)
MONOS PCT: 7 %
Monocytes Absolute: 0.3 10*3/uL (ref 0.1–1.0)
NEUTROS PCT: 80 %
Neutro Abs: 2.9 10*3/uL (ref 1.7–7.7)
Platelets: 105 10*3/uL — ABNORMAL LOW (ref 150–400)
RBC: 2.86 MIL/uL — AB (ref 4.22–5.81)
RDW: 15.5 % (ref 11.5–15.5)
WBC: 3.6 10*3/uL — AB (ref 4.0–10.5)

## 2016-09-13 LAB — ABO/RH: ABO/RH(D): AB POS

## 2016-09-13 LAB — TROPONIN I
TROPONIN I: 0.37 ng/mL — AB (ref <0.03)
Troponin I: 0.47 ng/mL (ref <0.03)

## 2016-09-13 LAB — MRSA PCR SCREENING: MRSA by PCR: NEGATIVE

## 2016-09-13 LAB — CORTISOL: CORTISOL PLASMA: 33.5 ug/dL

## 2016-09-13 LAB — LACTIC ACID, PLASMA: LACTIC ACID, VENOUS: 1.7 mmol/L (ref 0.5–1.9)

## 2016-09-13 MED ORDER — ALPRAZOLAM 0.5 MG PO TABS
0.5000 mg | ORAL_TABLET | Freq: Once | ORAL | Status: AC
  Administered 2016-09-13: 0.5 mg via ORAL
  Filled 2016-09-13: qty 1

## 2016-09-13 NOTE — Progress Notes (Signed)
PROGRESS NOTE    Donald Delacruz  IEP:[id number] DOB: 1940-09-01 DOA: 09/12/2016 PCP: [name], MD  Outpatient Specialists:     Brief Narrative:  51 ? metastatic recurrent  non-small cell lung cancer initially diagnosed as stage IB (T2a, N0, M0)-August 2016 with disease recurrence in December 2017. Htn Anemia malignancy Troponin  Admitted with sepsis and likely Post-obst pna  Assessment & Plan:   Active Problems:   Influenza B   1. sepsis secondary to H influenza B with possible superimposed bacterial infection-wife recently diagnosed-Lactic acid 3.5-->1.7 and improved Treat with Tamiflu. continue vancomycin and cefepime. holding anti-htn continue IV saline at 125 cc per hour-sepsis physiology resolved.  Transfer to tele 2. hypotension-is on Cardizem 180, lisinopril 20, metoprolol 150-these will be held-when necessary metoprolol IV for heart rate of 120 given only if systolic pressure above 660 3. metastatic non-small cell lung cancer-continue XRT, will need oncology input as an outpatient regarding goals of care- At this time patient would like to be a full code 4. Anemia of malignancy, mild pancytopenia-continue iron supplement, hold vitamin C-hemoglobin is stable in the9chief complaint history of 149 and WBC is 3. 4-slgith drop in WBc-recheck am labs 5. elevated troponin-patient has no chest pain whatsoever EKG looks relatively benign. Trending downwards from 0.7-->0.4 6. weight loss secondary to cancer-hold trazodone, hold Remeron for now 7. Pain secondary to cancer-continue MS Contin 30 mg, meloxicam 50, Percocet 1 tablet every 6 when necessary  full CODE STATUS Admitted to step down as need to monitor hemodynamics with sepsis No family at bedside Inpatient   Antimicrobials:    Vanc   Cefepime   Tamiflu   Subjective:  much improved No fever no chills no n/v/cp Otherwise better and hungry  Objective: Vitals:   09/13/16 0315 09/13/16 0400 09/13/16  0500 09/13/16 0600  BP: (!) 97/52 (!) 92/52 (!) 95/51 108/62  Pulse: 95 95 89 87  Resp: (!) 22 (!) 25 (!) 22 (!) 21  Temp:      TempSrc:      SpO2: 99% 99% 99% 100%  Weight:      Height:        Intake/Output Summary (Last 24 hours) at 09/13/16 0733 Last data filed at 09/13/16 0430  Gross per 24 hour  Intake              650 ml  Output              500 ml  Net              150 ml   Filed Weights   09/12/16 2200  Weight: 69 kg (152 lb 1.9 oz)    Examination:  Alert pleasant  Chest clear no added sound s1 s 2no m/r/g Tele sinus-sinus tahc resovled abd soft No le edema    Data Reviewed: I have personally reviewed following labs and imaging studies  CBC:  Recent Labs Lab 09/09/16 1511 09/12/16 1452 09/12/16 2018  WBC 6.3 3.4* 2.9*  NEUTROABS 5.1 2.9  --   HGB 9.6* 9.6* 8.0*  HCT 28.9* 29.0* 23.4*  MCV 90.1 88.1 89.0  PLT 171 149* 630*   Basic Metabolic Panel:  Recent Labs Lab 09/09/16 1511 09/12/16 1452 09/12/16 2018  NA 129* 134* 131*  K 4.2 4.8 4.2  CL  --  103 104  CO2 19* 22 20*  GLUCOSE 127 124* 184*  BUN 27.4* 22* 20  CREATININE 1.4* 1.16 0.96  CALCIUM 8.8 8.3* 7.1*   GFR: Estimated  Creatinine Clearance: 63.9 mL/min (by C-G formula based on SCr of 0.96 mg/dL). Liver Function Tests:  Recent Labs Lab 09/09/16 1511 09/12/16 1452 09/12/16 2018  AST 51* 42* 36  ALT 33 20 19  ALKPHOS 85 61 61  BILITOT 0.65 1.0 0.8  PROT 6.2* 5.7* 4.2*  ALBUMIN 2.5* 2.3* 2.0*   No results for input(s): LIPASE, AMYLASE in the last 168 hours. No results for input(s): AMMONIA in the last 168 hours. Coagulation Profile:  Recent Labs Lab 09/12/16 2018  INR 1.27   Cardiac Enzymes:  Recent Labs Lab 09/12/16 1452 09/12/16 2102 09/13/16 0038  TROPONINI 0.73* 0.50* 0.47*   BNP (last 3 results) No results for input(s): PROBNP in the last 8760 hours. HbA1C: No results for input(s): HGBA1C in the last 72 hours. CBG: No results for input(s): GLUCAP  in the last 168 hours. Lipid Profile: No results for input(s): CHOL, HDL, LDLCALC, TRIG, CHOLHDL, LDLDIRECT in the last 72 hours. Thyroid Function Tests: No results for input(s): TSH, T4TOTAL, FREET4, T3FREE, THYROIDAB in the last 72 hours. Anemia Panel: No results for input(s): VITAMINB12, FOLATE, FERRITIN, TIBC, IRON, RETICCTPCT in the last 72 hours. Urine analysis:    Component Value Date/Time   COLORURINE YELLOW 09/13/2016 0200   APPEARANCEUR CLEAR 09/13/2016 0200   LABSPEC >1.046 (H) 09/13/2016 0200   PHURINE 5.0 09/13/2016 0200   GLUCOSEU NEGATIVE 09/13/2016 0200   HGBUR NEGATIVE 09/13/2016 0200   BILIRUBINUR NEGATIVE 09/13/2016 0200   KETONESUR NEGATIVE 09/13/2016 0200   PROTEINUR NEGATIVE 09/13/2016 0200   UROBILINOGEN 0.2 01/26/2015 1016   NITRITE NEGATIVE 09/13/2016 0200   LEUKOCYTESUR NEGATIVE 09/13/2016 0200   Sepsis Labs: '@LABRCNTIP'$ (procalcitonin:4,lacticidven:4)  ) Recent Results (from the past 240 hour(s))  Blood culture (routine x 2)     Status: None (Preliminary result)   Collection Time: 09/12/16  2:53 PM  Result Value Ref Range Status   Specimen Description   Final    BLOOD LEFT ANTECUBITAL Performed at [facility], [place] [address], [place], [place] [address]    Special Requests BOTTLES DRAWN AEROBIC AND ANAEROBIC 5CC  Final   Culture PENDING  Incomplete   Report Status PENDING  Incomplete  MRSA PCR Screening     Status: None   Collection Time: 09/12/16 10:14 PM  Result Value Ref Range Status   MRSA by PCR NEGATIVE NEGATIVE Final    Comment:        The GeneXpert MRSA Assay (FDA approved for NASAL specimens only), is one component of a comprehensive MRSA colonization surveillance program. It is not intended to diagnose MRSA infection nor to guide or monitor treatment for MRSA infections.          Radiology Studies: Dg Chest 2 View  Result Date: 09/12/2016 CLINICAL DATA:  Shortness of breath. EXAM: CHEST  2 VIEW COMPARISON:   07/08/2016. FINDINGS: Heart size normal. Postsurgical changes right chest. Mild infiltrate right lung base cannot be excluded. Mild bilateral pleural thickening again noted most likely scarring. IMPRESSION: 1. Postsurgical changes right chest. 2.  Mild right base infiltrate cannot be excluded . Electronically Signed   By: Marcello Moores  Register   On: 09/12/2016 15:55   Ct Angio Chest Pe W And/or Wo Contrast  Result Date: 09/12/2016 CLINICAL DATA:  Lung cancer. Tachycardia. Hypotension. Right upper lobectomy. EXAM: CT ANGIOGRAPHY CHEST WITH CONTRAST TECHNIQUE: Multidetector CT imaging of the chest was performed using the standard protocol during bolus administration of intravenous contrast. Multiplanar CT image reconstructions and MIPs were obtained to evaluate  the vascular anatomy. CONTRAST:  100 cc Isovue 370 COMPARISON:  09/12/2016 FINDINGS: Cardiovascular: No filling defect is identified in the pulmonary arterial tree to suggest pulmonary embolus. Coronary, aortic arch, and branch vessel atherosclerotic vascular disease. Mediastinum/Nodes: Right paratracheal tumor, with increasing soft tissue density surrounding the right mainstem bronchus. Subcarinal tumor noted. An index prevascular node measures 1.0 cm in short axis on image 20/5 and was previously hypermetabolic as well. Lungs/Pleura: There has been interval complete atelectasis of the right middle lobe. The right upper lobe is surgically absent. Truncated right middle lobe bronchus likely due to tumor. Narrowed right lower lobe central tracheobronchial tree due to tumor, possibly invaded by tumor. Reticulonodular ground-glass opacities in the right lower lobe per most confluent posteriorly and inferiorly, and compatible with infection. There is lesser degree of reticulonodular opacity in the left lower lobe along with some consolidation in the posterior basal segment left lower lobe compatible with pneumonia. New small right pleural effusion. Bilateral airway  thickening is noted.  Emphysema is present. Upper Abdomen: Cystic lesion of the left kidney upper pole. Mild fullness of the left adrenal gland. Musculoskeletal: Scattered makes bony densities suspicious for relatively diffuse osseous metastatic disease. Review of the MIP images confirms the above findings. IMPRESSION: 1. No filling defect is identified in the pulmonary arterial tree to suggest pulmonary embolus. 2. Airspace opacities along with reticulonodular and ground-glass opacities especially in the right lower lobe, and also with some consolidation in the posterior basal segment left lower lobe, compatible with multilobar pneumonia or aspiration pneumonitis. There is also airway thickening. 3. Interval complete collapse of the right middle lobe, probably due to obstructing tumor. There is prominent narrowing of the right lower lobe tracheobronchial tree due to tumor. The right upper lobe is surgically absent. 4. Emphysema. 5. Mild fullness of the left adrenal gland, cannot exclude early metastatic disease. 6. Coronary, aortic arch, and branch vessel atherosclerotic vascular disease. 7. Tumor wraps around the right mainstem bronchus and there are scattered malignant mediastinal lymph nodes. 8. Scattered osseous metastatic disease. 9. New small right pleural effusion. Electronically Signed   By: Van Clines M.D.   On: 09/12/2016 18:40        Scheduled Meds: . enoxaparin (LOVENOX) injection  30 mg Subcutaneous Q24H  . ferrous sulfate  325 mg Oral Daily  . meloxicam  15 mg Oral Daily  . morphine  30 mg Oral Q12H  . ondansetron  8 mg Intravenous Once  . oseltamivir  75 mg Oral BID  . sodium chloride  1,000 mL Intravenous Once   And  . sodium chloride  1,000 mL Intravenous Once   And  . sodium chloride  250 mL Intravenous Once   Continuous Infusions: . sodium chloride 100 mL/hr at 09/13/16 0719     LOS: 1 day    Time spent: [place], MD [facility] ([phone]   If 7PM-7AM, please contact night-coverage [email or web address] Password [facility] 09/13/2016, 7:33 AM

## 2016-09-13 NOTE — Telephone Encounter (Signed)
Okay with palliative care. We can order the scan if they are still interested in looking at the results of his treatment.

## 2016-09-14 LAB — BASIC METABOLIC PANEL
Anion gap: 9 (ref 5–15)
BUN: 12 mg/dL (ref 6–20)
CHLORIDE: 110 mmol/L (ref 101–111)
CO2: 17 mmol/L — AB (ref 22–32)
Calcium: 8 mg/dL — ABNORMAL LOW (ref 8.9–10.3)
Creatinine, Ser: 0.64 mg/dL (ref 0.61–1.24)
GFR calc non Af Amer: >60 mL/min (ref >60)
Glucose, Bld: 95 mg/dL (ref 65–99)
POTASSIUM: 4.3 mmol/L (ref 3.5–5.1)
SODIUM: 136 mmol/L (ref 135–145)

## 2016-09-14 LAB — URINE CULTURE: Culture: <10000 — AB

## 2016-09-14 MED ORDER — GABAPENTIN 300 MG PO CAPS
600.0000 mg | ORAL_CAPSULE | Freq: Every day | ORAL | Status: DC
  Administered 2016-09-14 – 2016-09-15 (×2): 600 mg via ORAL
  Filled 2016-09-14 (×2): qty 2

## 2016-09-14 MED ORDER — LEVOFLOXACIN 500 MG PO TABS
500.0000 mg | ORAL_TABLET | Freq: Every day | ORAL | Status: DC
  Administered 2016-09-14 – 2016-09-16 (×3): 500 mg via ORAL
  Filled 2016-09-14 (×3): qty 1

## 2016-09-14 MED ORDER — METOPROLOL SUCCINATE ER 50 MG PO TB24
150.0000 mg | ORAL_TABLET | Freq: Every morning | ORAL | Status: DC
  Administered 2016-09-14 – 2016-09-16 (×3): 150 mg via ORAL
  Filled 2016-09-14: qty 6
  Filled 2016-09-14 (×2): qty 1

## 2016-09-14 MED ORDER — ENSURE ENLIVE PO LIQD
237.0000 mL | Freq: Two times a day (BID) | ORAL | Status: DC
  Administered 2016-09-14 – 2016-09-16 (×3): 237 mL via ORAL

## 2016-09-14 MED ORDER — TRAZODONE HCL 50 MG PO TABS
100.0000 mg | ORAL_TABLET | Freq: Every day | ORAL | Status: DC
  Administered 2016-09-14: 100 mg via ORAL
  Filled 2016-09-14: qty 2

## 2016-09-14 MED ORDER — SODIUM CHLORIDE 0.9 % IV SOLN
1000.0000 mL | INTRAVENOUS | Status: AC
  Administered 2016-09-14: 1000 mL via INTRAVENOUS

## 2016-09-14 NOTE — Progress Notes (Signed)
PROGRESS NOTE    Donald Delacruz  GUY:[id number] DOB: 1940-11-02 DOA: 09/12/2016 PCP: [name], MD  Outpatient Specialists:     Brief Narrative:   43 ? metastatic recurrent  non-small cell lung cancer initially diagnosed as stage IB (T2a, N0, M0)-August 2016 with disease recurrence in December 2017. Htn Anemia malignancy Troponin  Admitted with sepsis and likely Post-obst pna  Assessment & Plan:   Active Problems:   Influenza B   1. sepsis secondary to H influenza B with possible superimposed bacterial infection-wife recently diagnosed-Lactic acid 3.5-->1.7 and improved Treat with Tamiflu.transition from vanc/cefepime to levaquin. continue IV saline at 100 cc per hour-sepsis physiology resolved.  Transfer to tele 2. hypotension has resolved previously on Cardizem 180, lisinopril 20-these have been held. metoprolol 75 XL ordered 3/18 3. Tachycardia probably secondary to withdrawal of the blocker. See above discussion 4. metastatic non-small cell lung cancer-continue XRT, long discussion with family 3/17 and if oncologist is okay with the decision, can ask palliative care to see 5. Anemia of malignancy, mild pancytopenia-continue iron supplement, hold vitamin C- am labs 6. elevated troponin-patient has no chest pain whatsoever EKG looks relatively benign. Trending downwards from 0.7-->0.4 7. weight loss secondary to cancer-hold trazodone, hold Remeron for now 8. Pain secondary to cancer-continue MS Contin 30 mg, meloxicam 50, Percocet 1 tablet every 6 when necessary    Antimicrobials:    Vanc   Cefepime   Tamiflu   Subjective:  fair improved Seems improved No cp no n/v Eating some better No diarr  Objective: Vitals:   09/14/16 0400 09/14/16 0500 09/14/16 0600 09/14/16 0700  BP: 137/76 136/87 (!) 149/94 (!) 128/49  Pulse: (!) 115 (!) 111 (!) 121 (!) 114  Resp: (!) 29 (!) 26 (!) 27 (!) 27  Temp:  97.5 F (36.4 C)    TempSrc:  Oral    SpO2: 97% 98%  96% 98%  Weight:      Height:        Intake/Output Summary (Last 24 hours) at 09/14/16 0747 Last data filed at 09/14/16 0700  Gross per 24 hour  Intake             1840 ml  Output              400 ml  Net             1440 ml   Filed Weights   09/12/16 2200  Weight: 69 kg (152 lb 1.9 oz)    Examination:  Alert pleasant  Chest clear no added sound s1 s 2no m/r/g Tele sinus-sinus tahc resovled abd soft No le edema    Data Reviewed: I have personally reviewed following labs and imaging studies  CBC:  Recent Labs Lab 09/09/16 1511 09/12/16 1452 09/12/16 2018 09/13/16 0738  WBC 6.3 3.4* 2.9* 3.6*  NEUTROABS 5.1 2.9  --  2.9  HGB 9.6* 9.6* 8.0* 8.6*  HCT 28.9* 29.0* 23.4* 25.5*  MCV 90.1 88.1 89.0 89.2  PLT 171 149* 120* 563*   Basic Metabolic Panel:  Recent Labs Lab 09/09/16 1511 09/12/16 1452 09/12/16 2018  NA 129* 134* 131*  K 4.2 4.8 4.2  CL  --  103 104  CO2 19* 22 20*  GLUCOSE 127 124* 184*  BUN 27.4* 22* 20  CREATININE 1.4* 1.16 0.96  CALCIUM 8.8 8.3* 7.1*   GFR: Estimated Creatinine Clearance: 63.9 mL/min (by C-G formula based on SCr of 0.96 mg/dL). Liver Function Tests:  Recent Labs Lab 09/09/16 1511 09/12/16  1452 09/12/16 2018  AST 51* 42* 36  ALT 33 20 19  ALKPHOS 85 61 61  BILITOT 0.65 1.0 0.8  PROT 6.2* 5.7* 4.2*  ALBUMIN 2.5* 2.3* 2.0*   No results for input(s): LIPASE, AMYLASE in the last 168 hours. No results for input(s): AMMONIA in the last 168 hours. Coagulation Profile:  Recent Labs Lab 09/12/16 2018  INR 1.27   Cardiac Enzymes:  Recent Labs Lab 09/12/16 1452 09/12/16 2102 09/13/16 0038 09/13/16 0735  TROPONINI 0.73* 0.50* 0.47* 0.37*   BNP (last 3 results) No results for input(s): PROBNP in the last 8760 hours. HbA1C: No results for input(s): HGBA1C in the last 72 hours. CBG: No results for input(s): GLUCAP in the last 168 hours. Lipid Profile: No results for input(s): CHOL, HDL, LDLCALC, TRIG,  CHOLHDL, LDLDIRECT in the last 72 hours. Thyroid Function Tests: No results for input(s): TSH, T4TOTAL, FREET4, T3FREE, THYROIDAB in the last 72 hours. Anemia Panel: No results for input(s): VITAMINB12, FOLATE, FERRITIN, TIBC, IRON, RETICCTPCT in the last 72 hours. Urine analysis:    Component Value Date/Time   COLORURINE YELLOW 09/13/2016 0200   APPEARANCEUR CLEAR 09/13/2016 0200   LABSPEC >1.046 (H) 09/13/2016 0200   PHURINE 5.0 09/13/2016 0200   GLUCOSEU NEGATIVE 09/13/2016 0200   HGBUR NEGATIVE 09/13/2016 0200   BILIRUBINUR NEGATIVE 09/13/2016 0200   KETONESUR NEGATIVE 09/13/2016 0200   PROTEINUR NEGATIVE 09/13/2016 0200   UROBILINOGEN 0.2 01/26/2015 1016   NITRITE NEGATIVE 09/13/2016 0200   LEUKOCYTESUR NEGATIVE 09/13/2016 0200   Sepsis Labs: '@LABRCNTIP'$ (procalcitonin:4,lacticidven:4)  ) Recent Results (from the past 240 hour(s))  Blood culture (routine x 2)     Status: None (Preliminary result)   Collection Time: 09/12/16  2:53 PM  Result Value Ref Range Status   Specimen Description BLOOD LEFT ANTECUBITAL  Final   Special Requests BOTTLES DRAWN AEROBIC AND ANAEROBIC 5CC  Final   Culture   Final    NO GROWTH < 24 HOURS Performed at [facility], [place] [address], [place], [place] [address]    Report Status PENDING  Incomplete  Blood culture (routine x 2)     Status: None (Preliminary result)   Collection Time: 09/12/16  3:15 PM  Result Value Ref Range Status   Specimen Description BLOOD LEFT HAND  Final   Special Requests IN PEDIATRIC BOTTLE 2CC  Final   Culture   Final    NO GROWTH < 24 HOURS Performed at [facility], [place] [address], [place], [place] [address]    Report Status PENDING  Incomplete  MRSA PCR Screening     Status: None   Collection Time: 09/12/16 10:14 PM  Result Value Ref Range Status   MRSA by PCR NEGATIVE NEGATIVE Final    Comment:        The GeneXpert MRSA Assay (FDA approved for NASAL specimens only), is one component of  a comprehensive MRSA colonization surveillance program. It is not intended to diagnose MRSA infection nor to guide or monitor treatment for MRSA infections.   Urine culture     Status: Abnormal   Collection Time: 09/13/16  2:00 AM  Result Value Ref Range Status   Specimen Description URINE, CLEAN CATCH  Final   Special Requests NONE  Final   Culture (A)  Final    <10,000 COLONIES/mL INSIGNIFICANT GROWTH Performed at [facility], [address] [place], [place] [address]    Report Status 09/14/2016 FINAL  Final  Radiology Studies: Dg Chest 2 View  Result Date: 09/12/2016 CLINICAL DATA:  Shortness of breath. EXAM: CHEST  2 VIEW COMPARISON:  07/08/2016. FINDINGS: Heart size normal. Postsurgical changes right chest. Mild infiltrate right lung base cannot be excluded. Mild bilateral pleural thickening again noted most likely scarring. IMPRESSION: 1. Postsurgical changes right chest. 2.  Mild right base infiltrate cannot be excluded . Electronically Signed   By: Marcello Moores  Register   On: 09/12/2016 15:55   Ct Angio Chest Pe W And/or Wo Contrast  Result Date: 09/12/2016 CLINICAL DATA:  Lung cancer. Tachycardia. Hypotension. Right upper lobectomy. EXAM: CT ANGIOGRAPHY CHEST WITH CONTRAST TECHNIQUE: Multidetector CT imaging of the chest was performed using the standard protocol during bolus administration of intravenous contrast. Multiplanar CT image reconstructions and MIPs were obtained to evaluate the vascular anatomy. CONTRAST:  100 cc Isovue 370 COMPARISON:  09/12/2016 FINDINGS: Cardiovascular: No filling defect is identified in the pulmonary arterial tree to suggest pulmonary embolus. Coronary, aortic arch, and branch vessel atherosclerotic vascular disease. Mediastinum/Nodes: Right paratracheal tumor, with increasing soft tissue density surrounding the right mainstem bronchus. Subcarinal tumor noted. An index prevascular node measures 1.0 cm in short axis on image 20/5 and  was previously hypermetabolic as well. Lungs/Pleura: There has been interval complete atelectasis of the right middle lobe. The right upper lobe is surgically absent. Truncated right middle lobe bronchus likely due to tumor. Narrowed right lower lobe central tracheobronchial tree due to tumor, possibly invaded by tumor. Reticulonodular ground-glass opacities in the right lower lobe per most confluent posteriorly and inferiorly, and compatible with infection. There is lesser degree of reticulonodular opacity in the left lower lobe along with some consolidation in the posterior basal segment left lower lobe compatible with pneumonia. New small right pleural effusion. Bilateral airway thickening is noted.  Emphysema is present. Upper Abdomen: Cystic lesion of the left kidney upper pole. Mild fullness of the left adrenal gland. Musculoskeletal: Scattered makes bony densities suspicious for relatively diffuse osseous metastatic disease. Review of the MIP images confirms the above findings. IMPRESSION: 1. No filling defect is identified in the pulmonary arterial tree to suggest pulmonary embolus. 2. Airspace opacities along with reticulonodular and ground-glass opacities especially in the right lower lobe, and also with some consolidation in the posterior basal segment left lower lobe, compatible with multilobar pneumonia or aspiration pneumonitis. There is also airway thickening. 3. Interval complete collapse of the right middle lobe, probably due to obstructing tumor. There is prominent narrowing of the right lower lobe tracheobronchial tree due to tumor. The right upper lobe is surgically absent. 4. Emphysema. 5. Mild fullness of the left adrenal gland, cannot exclude early metastatic disease. 6. Coronary, aortic arch, and branch vessel atherosclerotic vascular disease. 7. Tumor wraps around the right mainstem bronchus and there are scattered malignant mediastinal lymph nodes. 8. Scattered osseous metastatic disease.  9. New small right pleural effusion. Electronically Signed   By: Van Clines M.D.   On: 09/12/2016 18:40        Scheduled Meds: . enoxaparin (LOVENOX) injection  30 mg Subcutaneous Q24H  . ferrous sulfate  325 mg Oral Daily  . meloxicam  15 mg Oral Daily  . morphine  30 mg Oral Q12H  . ondansetron  8 mg Intravenous Once  . oseltamivir  75 mg Oral BID  . sodium chloride  1,000 mL Intravenous Once   And  . sodium chloride  1,000 mL Intravenous Once   And  . sodium chloride  250 mL Intravenous Once  Continuous Infusions: . sodium chloride 100 mL/hr at 09/13/16 2038     LOS: 2 days    Time spent: [place], MD [facility]   If 7PM-7AM, please contact night-coverage [email or web address] Password [facility] 09/14/2016, 7:47 AM

## 2016-09-14 NOTE — Progress Notes (Signed)
PCP was notified that the patient needed some anti-anxiety medication. Awaiting any new orders.

## 2016-09-14 NOTE — Progress Notes (Signed)
Initial Nutrition Assessment  DOCUMENTATION CODES:   Severe malnutrition in context of acute illness/injury  INTERVENTION:   Provide Ensure Enlive po BID, each supplement provides 350 kcal and 20 grams of protein RD to continue to monitor  NUTRITION DIAGNOSIS:   Malnutrition related to acute illness as evidenced by percent weight loss, energy intake < or equal to 50% for > or equal to 5 days, mild depletion of muscle mass.  GOAL:   Patient will meet greater than or equal to 90% of their needs  MONITOR:   PO intake, Supplement acceptance, Labs, Weight trends, I & O's  REASON FOR ASSESSMENT:   Malnutrition Screening Tool    ASSESSMENT:   Patient presents today complaining of increasing weakness and shortness of breath.  He has a history of recurrent lung cancer and is currently receiving radiation.  Family is in a significant decline in the past several weeks.  He was started on oxygen several days ago for new hypoxia and recently received IV fluids at the cancer center for severe tachycardia.  Productive cough without reported fever.  His wife was just hospitalized yesterday at [facility] for influenza B.   Patient in room with no family at bedside. Per chart, pt's wife is also hospitalized for the flu. Pt states that since December he has not been eating well with poor appetite. States he will snack on crackers, oatmeal and at most would eat 1/2 sandwich on a good day. Pt states that sometimes he would sip on a protein drink. PO intake: 25%. Pt with breakfast tray at bedside. Pt states he was not ready to eat just yet. Reports improved taste recently. States he has to take his time to swallow properly but denies chewing difficulties.  Will order Ensure supplements between meals.  UBW is between 160-170 lb. Since 1/9, pt has lost 18 lb, 11% wt loss x 2 months which is significant for time frame. Nutrition-Focused physical exam completed. Findings are no fat depletion, mild  muscle depletion, and no edema.   Labs reviewed. Medications: Ferrous sulfate tablet daily   Diet Order:  Diet Heart Room service appropriate? Yes; Fluid consistency: Thin  Skin:  Reviewed, no issues  Last BM:  3/17  Height:   Ht Readings from Last 1 Encounters:  09/12/16 '5\' 10"'$  (1.778 m)    Weight:   Wt Readings from Last 1 Encounters:  09/12/16 152 lb 1.9 oz (69 kg)    Ideal Body Weight:  75.5 kg  BMI:  Body mass index is 21.83 kg/m.  Estimated Nutritional Needs:   Kcal:  2000-2200  Protein:  95-105g  Fluid:  2L/day  EDUCATION NEEDS:   Education needs addressed  Clayton Bibles, MS, RD, LDN Pager: [phone] After Hours Pager: [phone]

## 2016-09-15 ENCOUNTER — Encounter: Payer: Self-pay | Admitting: Internal Medicine

## 2016-09-15 ENCOUNTER — Encounter: Payer: Self-pay | Admitting: Radiation Oncology

## 2016-09-15 ENCOUNTER — Telehealth: Payer: Self-pay | Admitting: Medical Oncology

## 2016-09-15 ENCOUNTER — Inpatient Hospital Stay (HOSPITAL_COMMUNITY): Payer: Medicare Other

## 2016-09-15 ENCOUNTER — Ambulatory Visit: Payer: Medicare Other

## 2016-09-15 DIAGNOSIS — Z515 Encounter for palliative care: Secondary | ICD-10-CM

## 2016-09-15 DIAGNOSIS — J189 Pneumonia, unspecified organism: Secondary | ICD-10-CM

## 2016-09-15 DIAGNOSIS — C3401 Malignant neoplasm of right main bronchus: Secondary | ICD-10-CM

## 2016-09-15 DIAGNOSIS — C771 Secondary and unspecified malignant neoplasm of intrathoracic lymph nodes: Secondary | ICD-10-CM

## 2016-09-15 DIAGNOSIS — J111 Influenza due to unidentified influenza virus with other respiratory manifestations: Secondary | ICD-10-CM

## 2016-09-15 DIAGNOSIS — Z7189 Other specified counseling: Secondary | ICD-10-CM

## 2016-09-15 DIAGNOSIS — R531 Weakness: Secondary | ICD-10-CM

## 2016-09-15 DIAGNOSIS — C3410 Malignant neoplasm of upper lobe, unspecified bronchus or lung: Secondary | ICD-10-CM

## 2016-09-15 DIAGNOSIS — J90 Pleural effusion, not elsewhere classified: Secondary | ICD-10-CM

## 2016-09-15 MED ORDER — LORAZEPAM 1 MG PO TABS
1.0000 mg | ORAL_TABLET | Freq: Once | ORAL | Status: AC
  Administered 2016-09-15: 1 mg via ORAL
  Filled 2016-09-15: qty 1

## 2016-09-15 MED ORDER — MIRTAZAPINE 15 MG PO TABS
30.0000 mg | ORAL_TABLET | Freq: Every day | ORAL | Status: DC
  Administered 2016-09-15: 30 mg via ORAL
  Filled 2016-09-15: qty 2

## 2016-09-15 MED ORDER — DILTIAZEM HCL ER COATED BEADS 120 MG PO CP24
120.0000 mg | ORAL_CAPSULE | Freq: Every morning | ORAL | Status: DC
  Administered 2016-09-15 – 2016-09-16 (×2): 120 mg via ORAL
  Filled 2016-09-15 (×2): qty 1

## 2016-09-15 NOTE — Progress Notes (Signed)
Subjective: The patient is seen and examined today. His son was at the bedside. The patient is feeling a little bit better today. He was admitted to the hospital with influenza B as well as postobstructive pneumonia. CT angiogram of the chest on admission showed no evidence for pulmonary embolism but there was increased to opacity along with reticulonodular opacities especially in the right lower lobe and some consolidation in the posterior basal segment of the left lower lobe compatible with multilobar pneumonia or aspiration pneumonitis. There was also interval collapse of the right middle lobe probably due to tumor obstruction. There was also tumor abscess around the right mainstem bronchus with scattered malignant mediastinal lymph nodes and scattered osseous metastatic disease as well as new small right pleural effusion. He denied having any fever or chills today. He has no nausea or vomiting. He continues to have shortness of breath.  Objective: Vital signs in last 24 hours: Temp:  [97.2 F (36.2 C)-97.6 F (36.4 C)] 97.5 F (36.4 C) (03/19 0558) Pulse Rate:  [111-120] 120 (03/19 1029) Resp:  [20-21] 20 (03/19 0558) BP: (110-139)/(71-97) 110/73 (03/19 1029) SpO2:  [98 %-100 %] 99 % (03/19 0558)  Intake/Output from previous day: 03/18 0701 - 03/19 0700 In: 2830 [P.O.:480; I.V.:2350] Out: 250 [Urine:250] Intake/Output this shift: No intake/output data recorded.  General appearance: alert, cooperative, fatigued and mild distress Resp: rales RML and rhonchi RML Cardio: regular rate and rhythm, S1, S2 normal, no murmur, click, rub or gallop GI: soft, non-tender; bowel sounds normal; no masses,  no organomegaly Extremities: extremities normal, atraumatic, no cyanosis or edema  Lab Results:   Recent Labs  09/12/16 2018 09/13/16 0738  WBC 2.9* 3.6*  HGB 8.0* 8.6*  HCT 23.4* 25.5*  PLT 120* 105*   BMET  Recent Labs  09/12/16 2018 09/14/16 0830  NA 131* 136  K 4.2 4.3  CL  104 110  CO2 20* 17*  GLUCOSE 184* 95  BUN 20 12  CREATININE 0.96 0.64  CALCIUM 7.1* 8.0*    Studies/Results: Dg Chest 2 View  Result Date: 09/15/2016 CLINICAL DATA:  Pneumonia. EXAM: CHEST  2 VIEW COMPARISON:  Radiographs and CT scan of September 12, 2016. FINDINGS: Stable cardiomediastinal silhouette. No pneumothorax is noted. Mildly increased right posterior basilar opacity is noted suggesting atelectasis or infiltrate. Left lung is unremarkable. Small associated pleural effusion cannot be excluded. Bony thorax is unremarkable. IMPRESSION: Slightly increased right basilar opacity is noted concerning for atelectasis or infiltrate. Electronically Signed   By: Marijo Conception, M.D.   On: 09/15/2016 10:08   Medications: I have reviewed the patient's current medications.  Assessment/Plan: This is a very pleasant 76 years old white male with metastatic non-small cell lung cancer, adenocarcinoma with no actionable mutations and negative PDL 1 expression. He is status post palliative radiotherapy to the mediastinal lymph nodes in addition to one cycle of systemic chemotherapy with carboplatin, Alimta and Avastin. He has rough time with the chemotherapy with increasing fatigue and weakness as well as dehydration and poor by mouth intake. His recent CT scan of the chest showed extensive disease in the chest and bone with collapse of the right middle lobe. I had a lengthy discussion with the patient and his son today about his current condition and treatment options. I recommended for the patient to consider palliative care and hospice at this point but I will also consider the patient for treatment in the future if he has improvement of his performance status. They agreed to  this option. For the influenza B and postobstructive pneumonia, the patient will continue his current treatment with Tamiflu in addition to Levaquin. For the generalized weakness and fatigue, the patient may benefit from discharge to a  skilled nursing facility for rehabilitation before going home. Thank you so much for taking good care of Mr. Dozier. Please call if you have any questions.    LOS: 3 days    Lillyian Heidt K. 09/15/2016

## 2016-09-15 NOTE — Telephone Encounter (Signed)
I spoke to [facility] in care management re discharge dispositionto see pt . She will have Cookie go see pt and family .Son notified.

## 2016-09-15 NOTE — Progress Notes (Signed)
PROGRESS NOTE    Donald Delacruz  GBE:[id number] DOB: 1940-12-23 DOA: 09/12/2016 PCP: [name], MD  Outpatient Specialists:     Brief Narrative:  41 ? metastatic recurrent NSLC initially diagnosed as stage IB (T2a, N0, M0)-August 2016 with disease recurrence in December 2017. Htn Anemia malignancy Troponin  Admitted with sepsis and likely Post-obst pna  Assessment & Plan:   Active Problems:   Influenza B   1. sepsis secondary to H influenza B with possible superimposed bacterial infection-wife recently diagnosed-Lactic acid 3.5-->1.7 and improved Treat with Tamiflu. Transitioned to by mouth Levaquin for broad-spectrum antibiotic. holding anti-htn. Saline lock IV from 125 cc per hour-sepsis physiology resolved.  Transfer to tele 2. hypotension-is on Cardizem 180, lisinopril 20, metoprolol 150-these will be held-when necessary metoprolol IV for heart rate of 120 given only if systolic pressure above [id number] home dosage of Cardizem 3. metastatic non-small cell lung cancer-continue XRT, will need oncology input as an outpatient regarding goals of care- At this time patient would like to be a full code 4. Anemia of malignancy, mild pancytopenia-continue iron supplement, hold vitamin C-hemoglobin is stable in the9chief complaint history of 149--repeat CBC a.m. 5. elevated troponin-patient has no chest pain whatsoever EKG looks relatively benign. Trending downwards from 0.7-->0.4 6. weight loss secondary to cancer-hold trazodone, hold Remeron for now 7. Pain secondary to cancer-continue MS Contin 30 mg, meloxicam 50, Percocet 1 tablet every 6 when necessary  full CODE STATUS tele Have discussed on 3/17 and 3/18 family  Inpatient   Antimicrobials:    Vanc   Cefepime   Tamiflu   Subjective:  Doing fair. No nausea no vomiting. Tolerating diet 2 episodes of diarrhea no chills stated No fever No chills  Objective: Vitals:   09/14/16 1515 09/14/16 1900 09/14/16  2319 09/15/16 0558  BP: (!) 139/97 132/79 125/84 119/71  Pulse: (!) 112 (!) 119 (!) 115 (!) 111  Resp: (!) '21 20 20 20  '$ Temp: 97.2 F (36.2 C) 97.5 F (36.4 C) 97.6 F (36.4 C) 97.5 F (36.4 C)  TempSrc: Oral Oral Oral Oral  SpO2: 98% 100% 98% 99%  Weight:      Height:        Intake/Output Summary (Last 24 hours) at 09/15/16 0853 Last data filed at 09/14/16 1700  Gross per 24 hour  Intake              800 ml  Output              250 ml  Net              550 ml   Filed Weights   09/12/16 2200  Weight: 69 kg (152 lb 1.9 oz)    Examination:  Alert pleasant No acute distress Chest clear no added sound s1 s 2no m/r/g Tele sinus-sinus tahc resovled abd soft No le edema    Data Reviewed: I have personally reviewed following labs and imaging studies  CBC:  Recent Labs Lab 09/09/16 1511 09/12/16 1452 09/12/16 2018 09/13/16 0738  WBC 6.3 3.4* 2.9* 3.6*  NEUTROABS 5.1 2.9  --  2.9  HGB 9.6* 9.6* 8.0* 8.6*  HCT 28.9* 29.0* 23.4* 25.5*  MCV 90.1 88.1 89.0 89.2  PLT 171 149* 120* 826*   Basic Metabolic Panel:  Recent Labs Lab 09/09/16 1511 09/12/16 1452 09/12/16 2018 09/14/16 0830  NA 129* 134* 131* 136  K 4.2 4.8 4.2 4.3  CL  --  103 104 110  CO2 19* 22 20* 17*  GLUCOSE 127 124* 184* 95  BUN 27.4* 22* 20 12  CREATININE 1.4* 1.16 0.96 0.64  CALCIUM 8.8 8.3* 7.1* 8.0*   GFR: Estimated Creatinine Clearance: 76.7 mL/min (by C-G formula based on SCr of 0.64 mg/dL). Liver Function Tests:  Recent Labs Lab 09/09/16 1511 09/12/16 1452 09/12/16 2018  AST 51* 42* 36  ALT 33 20 19  ALKPHOS 85 61 61  BILITOT 0.65 1.0 0.8  PROT 6.2* 5.7* 4.2*  ALBUMIN 2.5* 2.3* 2.0*   No results for input(s): LIPASE, AMYLASE in the last 168 hours. No results for input(s): AMMONIA in the last 168 hours. Coagulation Profile:  Recent Labs Lab 09/12/16 2018  INR 1.27   Cardiac Enzymes:  Recent Labs Lab 09/12/16 1452 09/12/16 2102 09/13/16 0038 09/13/16 0735    TROPONINI 0.73* 0.50* 0.47* 0.37*   BNP (last 3 results) No results for input(s): PROBNP in the last 8760 hours. HbA1C: No results for input(s): HGBA1C in the last 72 hours. CBG: No results for input(s): GLUCAP in the last 168 hours. Lipid Profile: No results for input(s): CHOL, HDL, LDLCALC, TRIG, CHOLHDL, LDLDIRECT in the last 72 hours. Thyroid Function Tests: No results for input(s): TSH, T4TOTAL, FREET4, T3FREE, THYROIDAB in the last 72 hours. Anemia Panel: No results for input(s): VITAMINB12, FOLATE, FERRITIN, TIBC, IRON, RETICCTPCT in the last 72 hours. Urine analysis:    Component Value Date/Time   COLORURINE YELLOW 09/13/2016 0200   APPEARANCEUR CLEAR 09/13/2016 0200   LABSPEC >1.046 (H) 09/13/2016 0200   PHURINE 5.0 09/13/2016 0200   GLUCOSEU NEGATIVE 09/13/2016 0200   HGBUR NEGATIVE 09/13/2016 0200   BILIRUBINUR NEGATIVE 09/13/2016 0200   KETONESUR NEGATIVE 09/13/2016 0200   PROTEINUR NEGATIVE 09/13/2016 0200   UROBILINOGEN 0.2 01/26/2015 1016   NITRITE NEGATIVE 09/13/2016 0200   LEUKOCYTESUR NEGATIVE 09/13/2016 0200   Sepsis Labs: '@LABRCNTIP'$ (procalcitonin:4,lacticidven:4)  ) Recent Results (from the past 240 hour(s))  Blood culture (routine x 2)     Status: None (Preliminary result)   Collection Time: 09/12/16  2:53 PM  Result Value Ref Range Status   Specimen Description BLOOD LEFT ANTECUBITAL  Final   Special Requests BOTTLES DRAWN AEROBIC AND ANAEROBIC 5CC  Final   Culture   Final    NO GROWTH 2 DAYS Performed at [facility], [place] [address] [place], [place] [address]    Report Status PENDING  Incomplete  Blood culture (routine x 2)     Status: None (Preliminary result)   Collection Time: 09/12/16  3:15 PM  Result Value Ref Range Status   Specimen Description BLOOD LEFT HAND  Final   Special Requests IN PEDIATRIC BOTTLE 2CC  Final   Culture   Final    NO GROWTH 2 DAYS Performed at [facility], [place] [address], [place], [place]  [address]    Report Status PENDING  Incomplete  Culture, blood (x 2)     Status: None (Preliminary result)   Collection Time: 09/12/16  8:05 PM  Result Value Ref Range Status   Specimen Description BLOOD LEFT ANTECUBITAL  Final   Special Requests IN PEDIATRIC BOTTLE 1 CC  Final   Culture   Final    NO GROWTH 1 DAY Performed at [facility], [place] [address] [place], [place] [address]    Report Status PENDING  Incomplete  Culture, blood (x 2)     Status: None (Preliminary result)   Collection Time: 09/12/16  9:09 PM  Result Value Ref Range Status  Specimen Description BLOOD LEFT ANTECUBITAL  Final   Special Requests BOTTLES DRAWN AEROBIC AND ANAEROBIC 10CC  Final   Culture   Final    NO GROWTH 1 DAY Performed at [facility], [place] [address], [place], [place] [address]    Report Status PENDING  Incomplete  MRSA PCR Screening     Status: None   Collection Time: 09/12/16 10:14 PM  Result Value Ref Range Status   MRSA by PCR NEGATIVE NEGATIVE Final    Comment:        The GeneXpert MRSA Assay (FDA approved for NASAL specimens only), is one component of a comprehensive MRSA colonization surveillance program. It is not intended to diagnose MRSA infection nor to guide or monitor treatment for MRSA infections.   Urine culture     Status: Abnormal   Collection Time: 09/13/16  2:00 AM  Result Value Ref Range Status   Specimen Description URINE, CLEAN CATCH  Final   Special Requests NONE  Final   Culture (A)  Final    <10,000 COLONIES/mL INSIGNIFICANT GROWTH Performed at [facility], [address], [place],  [address]    Report Status 09/14/2016 FINAL  Final         Radiology Studies: No results found.      Scheduled Meds: . enoxaparin (LOVENOX) injection  30 mg Subcutaneous Q24H  . feeding supplement (ENSURE ENLIVE)  237 mL Oral BID BM  . ferrous sulfate  325 mg Oral Daily  . gabapentin  600 mg Oral QHS  . levofloxacin  500 mg Oral Daily   . meloxicam  15 mg Oral Daily  . metoprolol succinate  150 mg Oral q morning - 10a  . morphine  30 mg Oral Q12H  . ondansetron  8 mg Intravenous Once  . oseltamivir  75 mg Oral BID  . sodium chloride  1,000 mL Intravenous Once   And  . sodium chloride  1,000 mL Intravenous Once   And  . sodium chloride  250 mL Intravenous Once  . traZODone  100 mg Oral QHS   Continuous Infusions: . sodium chloride 100 mL/hr at 09/14/16 2255     LOS: 3 days    Time spent: [place], MD [facility]   If 7PM-7AM, please contact night-coverage [email or web address] Password [facility] 09/15/2016, 8:53 AM

## 2016-09-15 NOTE — Progress Notes (Signed)
Spoke with pt's son concerning discharge plan. Will follow after PT eval.

## 2016-09-15 NOTE — Consult Note (Signed)
Consultation Note Date: 09/15/2016   Patient Name: Donald Delacruz  DOB: 09/25/1940  MRN: [id number]  Age / Sex: 76 y.o., male  PCP: Donald Sacramento, MD Referring Physician: Nita Sells, MD  Reason for Consultation: Establishing goals of care  HPI/Patient Profile: 76 y.o. male  with past medical history of metastatic adenocarcinoma admitted on 09/12/2016 with influenza.   Clinical Assessment and Goals of Care: I met today with Donald Delacruz. We discussed clinical course as well as wishes moving forward in regard to advanced directives.  Concepts specific to code status and rehospitalization discussed.  We discussed difference between a aggressive medical intervention path and a palliative, comfort focused care path.  Values and goals of care important to patient and family were attempted to be elicited.  Concept of Hospice and Palliative Care were discussed  Questions and concerns addressed.   PMT will continue to support holistically.  No documented HPOA, but he reports his sons helps with medical decision making  SUMMARY OF RECOMMENDATIONS   - FULL CODE - Patient reports being appreciative of information, but reports wanting to go to rehab and follow up with Donald Delacruz prior to making any further decisions  Code Status/Advance Care Planning:  Full code  Palliative Prophylaxis:   Frequent Pain Assessment  Additional Recommendations (Limitations, Scope, Preferences):  Full Scope Treatment  Psycho-social/Spiritual:   Desire for further Chaplaincy support:no  Additional Recommendations: Education on Hospice  Prognosis:   Unable to determine; if he were to forgo further disease modifying therapy, I believe his prognosis would be less than 6 months and he should qualify for hospice if so desired in the future  Discharge Planning: [place] for rehab with Palliative care  service follow-up most likely      Primary Diagnoses: Present on Admission: . Influenza B   I have reviewed the medical record, interviewed the patient and family, and examined the patient. The following aspects are pertinent.  Past Medical History:  Diagnosis Date  . 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 Cancer ([place])    lung cancer  . 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 Coronary artery calcification seen on computed tomography June 2016   CT scan of chest: Coronary artery calcifications are noted  . Dehydration 08/27/2016  . Dyspnea   . ED (erectile dysfunction)   . Emphysema lung ([place])   . Encounter for antineoplastic chemotherapy 07/31/2016  . Essential hypertension   . Goals of care, counseling/discussion 07/31/2016  . Insomnia   . Lipoma   . Osteoarthritis   . Restless legs syndrome   . Thoracic aortic atherosclerosis [facility]) June 2016   CT scan chest: Atherosclerosis of thoracic aorta is noted without aneurysm or dissection. Visualized portion of upper abdomen demonstrates stable calcified splenic artery and right renal   Social History   Social History  . Marital status: Married    Spouse name: N/A  . Number of children: N/A  . Years  of education: N/A   Occupational History  . insurance    Social History Main Topics  . Smoking status: Former Smoker    Packs/day: 1.00    Years: 39.00    Types: Cigarettes    Quit date: 06/30/1997  . Smokeless tobacco: Never Used     Comment: SMOKED PIPES ALSO  . Alcohol use 12.6 oz/week    21 Cans of beer per week     Comment: not drinking currently  . Drug use: No  . Sexual activity: Not Asked   Other Topics Concern  . None   Social History Narrative  . None   Family History  Problem Relation Age of Onset  . Lung cancer Mother   . Lung cancer Sister    Scheduled Meds: . diltiazem  120 mg Oral q morning - 10a  . enoxaparin (LOVENOX) injection  30  mg Subcutaneous Q24H  . feeding supplement (ENSURE ENLIVE)  237 mL Oral BID BM  . ferrous sulfate  325 mg Oral Daily  . gabapentin  600 mg Oral QHS  . levofloxacin  500 mg Oral Daily  . meloxicam  15 mg Oral Daily  . metoprolol succinate  150 mg Oral q morning - 10a  . mirtazapine  30 mg Oral QHS  . morphine  30 mg Oral Q12H  . ondansetron  8 mg Intravenous Once  . oseltamivir  75 mg Oral BID  . sodium chloride  1,000 mL Intravenous Once   And  . sodium chloride  1,000 mL Intravenous Once   And  . sodium chloride  250 mL Intravenous Once   Continuous Infusions: PRN Meds:.acetaminophen, HYDROcodone-homatropine, metoprolol, oxyCODONE-acetaminophen Medications Prior to Admission:  Prior to Admission medications   Medication Sig Start Date End Date Taking? Authorizing Provider  acetaminophen (TYLENOL) 500 MG tablet Take 1,000 mg by mouth every 4 (four) hours as needed for moderate pain or headache.    Yes Historical Provider, MD  albuterol (PROVENTIL HFA;VENTOLIN HFA) 108 (90 Base) MCG/ACT inhaler Inhale 1-2 puffs into the lungs every 6 (six) hours as needed for wheezing or shortness of breath. 08/07/16  Yes Susanne Borders, NP  benzonatate (TESSALON) 100 MG capsule Take 1 capsule (100 mg total) by mouth 3 (three) times daily as needed for cough. 08/21/16  Yes Hayden Pedro, PA-C  dexamethasone (DECADRON) 4 MG tablet 4 mg by mouth twice a day the day before, day of and day after the chemotherapy every 3 weeks 07/31/16  Yes Curt Bears, MD  diltiazem (CARDIZEM CD) 180 MG 24 hr capsule Take 180 mg by mouth every morning.  11/17/12  Yes Historical Provider, MD  ferrous sulfate 325 (65 FE) MG EC tablet Take 325 mg by mouth daily.   Yes Historical Provider, MD  folic acid (FOLVITE) 1 MG tablet Take 1 tablet (1 mg total) by mouth daily. 07/31/16  Yes Curt Bears, MD  gabapentin (NEURONTIN) 300 MG capsule Take 600 mg by mouth at bedtime.  04/23/11  Yes Historical Provider, MD    HYDROcodone-acetaminophen (NORCO/VICODIN) 5-325 MG tablet Take 1 tablet by mouth every 6 (six) hours as needed for moderate pain. 08/18/16  Yes Gery Pray, MD  HYDROcodone-homatropine [facility]) 5-1.5 MG/5ML syrup Take 5 mLs by mouth every 6 (six) hours as needed for cough. 09/02/16  Yes Susanne Borders, NP  lisinopril (PRINIVIL,ZESTRIL) 20 MG tablet Take 20 mg by mouth every morning.  04/23/11  Yes Historical Provider, MD  meloxicam (MOBIC) 15 MG tablet Take 15 mg  by mouth daily.   Yes Historical Provider, MD  metoprolol succinate (TOPROL-XL) 100 MG 24 hr tablet Take 150 mg by mouth every morning.  07/31/11  Yes Historical Provider, MD  mirtazapine (REMERON) 30 MG tablet Take 1 tablet (30 mg total) by mouth at bedtime. 08/27/16  Yes Curt Bears, MD  morphine (MS CONTIN) 30 MG 12 hr tablet Take 1 tablet (30 mg total) by mouth every 12 (twelve) hours. 08/27/16  Yes Curt Bears, MD  Multiple Vitamin (MULTIVITAMIN) capsule Take 1 capsule by mouth every morning.    Yes Historical Provider, MD  Omega-3 Fatty Acids (FISH OIL) 1000 MG CAPS Take 1,000 mg by mouth daily.   Yes Historical Provider, MD  oxyCODONE-acetaminophen (PERCOCET/ROXICET) 5-325 MG tablet Take 1 tablet by mouth every 6 (six) hours as needed for severe pain. 07/31/16  Yes Curt Bears, MD  prochlorperazine (COMPAZINE) 10 MG tablet TAKE 1 TABLET BY MOUTH EVERY 6 HOURS AS NEEDED FOR NAUSEA OR VOMITING 07/31/16  Yes Curt Bears, MD  traZODone (DESYREL) 100 MG tablet Take 100 mg by mouth at bedtime.   Yes Historical Provider, MD  Turmeric 500 MG CAPS Take 1 capsule by mouth daily.   Yes Historical Provider, MD  vitamin C (ASCORBIC ACID) 500 MG tablet Take 500 mg by mouth daily.   Yes Historical Provider, MD  emollient (BIAFINE) cream Apply topically as needed. Patient not taking: Reported on 09/09/2016 09/02/16   Gery Pray, MD   Allergies  Allergen Reactions  . Amlodipine Swelling  . Benicar [Olmesartan]     Dizziness,malaise    . Nisoldipine Swelling   Review of Systems  Constitutional: Positive for activity change, chills, fatigue and fever.  Psychiatric/Behavioral: Positive for sleep disturbance.    Physical Exam  General: Alert, awake, in no acute distress. Appears tired  HEENT: No bruits, no goiter, no JVD Heart: Regular rate and rhythm. No murmur appreciated. Lungs: Fair air movement, clear Abdomen: Soft, nontender, nondistended, positive bowel sounds.  Ext: No significant edema Skin: Warm and dry Neuro: Grossly intact, nonfocal.   Vital Signs: BP 113/63 (BP Location: Left Arm)   Pulse (!) 106   Temp 97.8 F (36.6 C) (Oral)   Resp 20   Ht _0  (1.778 m)   Wt 69 kg (152 lb 1.9 oz)   SpO2 99%   BMI 21.83 kg/m  Pain Assessment: No/denies pain   Pain Score: 0-No pain   SpO2: SpO2: 99 % O2 Device:SpO2: 99 % O2 Flow Rate: .O2 Flow Rate (L/min): 4 L/min  IO: Intake/output summary:  Intake/Output Summary (Last 24 hours) at 09/15/16 2303 Last data filed at 09/15/16 1900  Gross per 24 hour  Intake          1598.33 ml  Output              500 ml  Net          1098.33 ml    LBM: Last BM Date: 09/13/16 Baseline Weight: Weight: 69 kg (152 lb 1.9 oz) Most recent weight: Weight: 69 kg (152 lb 1.9 oz)     Palliative Assessment/Data:   Flowsheet Rows     Most Recent Value  Intake Tab  Referral Department  Hospitalist  Unit at Time of Referral  Cardiac/Telemetry Unit  Palliative Care Primary Diagnosis  Cancer  Date Notified  09/14/16  Palliative Care Type  New Palliative care  Reason for referral  Clarify Goals of Care  Date of Admission  09/12/16  Date first seen  by Palliative Care  09/14/16  # of days Palliative referral response time  0 Day(s)  # of days IP prior to Palliative referral  2  Clinical Assessment  Palliative Performance Scale Score  40%  Pain Max last 24 hours  3  Pain Min Last 24 hours  0  Psychosocial & Spiritual Assessment  Palliative Care Outcomes   Patient/Family meeting held?  Yes  Who was at the meeting?  patient      Time In: 1745 Time Out: 1845 Time Total: 60 Greater than 50%  of this time was spent counseling and coordinating care related to the above assessment and plan.  Signed by: Micheline Rough, MD   Please contact Palliative Medicine Team phone at [phone] for questions and concerns.  For individual provider: See Shea Evans

## 2016-09-15 NOTE — Progress Notes (Signed)
  Radiation Oncology         [phone] ________________________________  Name: Donald Delacruz MRN: [id number]  Date: 09/15/2016  DOB: 1940/10/22  End of Treatment Note  Diagnosis: Non-small cell lung cancer initially diagnosed as stage IB (T2a, N0, M0) moderately to poorly differentiated adenocarcinoma involving the right upper lobe diagnosed in August 2016, now with disease recurrence in December 2017.     Indication for treatment:  Palliative      Radiation treatment dates:   08/28/16-09/04/16  Site/dose:  Chest/ completed 12 Gy in 6 fractions of expected 40 Gy in 20 fractions  Beams/energy:  3D / 10X, 6X  Narrative: Due to the drop in his performance status, the patient decided to discontinue radiation treatment. During treatment, the patient complained of weakness and fatigue with activity. He reported a productive cough with yellow sputum. He also noted shortness of breath. He had a 6 pound weight loss associated with a poor appetite.   Plan: The patient has completed radiation treatment. The patient will return to radiation oncology clinic for routine followup in one month. I advised them to call or return sooner if they have any questions or concerns related to their recovery or treatment.  -----------------------------------  Blair Promise, PhD, MD  This document serves as a record of services personally performed by Gery Pray, MD. It was created on his behalf by Bethann Humble, a trained medical scribe. The creation of this record is based on the scribe's personal observations and the provider's statements to them. This document has been checked and approved by the attending provider.

## 2016-09-15 NOTE — Evaluation (Signed)
Physical Therapy Evaluation Patient Details Name: Donald Delacruz MRN: [id number] DOB: 06/16/1941 Today's Date: 09/15/2016   History of Present Illness  This is a very pleasant 76 years old white male with metastatic non-small cell lung cancer. He is status post palliative radiotherapy to the mediastinal lymph nodes in addition to one cycle of systemic chemotherapy. He has rough time with the chemotherapy with increasing fatigue and weakness as well as dehydration and poor by mouth intake.  Clinical Impression  Pt with the above diagnosis and presents with great weakness and SOB with little activity. Pt would benefit from continued PT to help strengthen and increase activity tolerance to assist with safe mobilization of regular daily activity. At this time pt would need ST-SNF to progress with his eventual goal to transition home .    Follow Up Recommendations SNF    Equipment Recommendations  None recommended by PT    Recommendations for Other Services       Precautions / Restrictions Restrictions Weight Bearing Restrictions: No      Mobility  Bed Mobility Overal bed mobility: Needs Assistance Bed Mobility: Supine to Sit;Sit to Supine     Supine to sit: Min assist;HOB elevated     General bed mobility comments: assit with upper body and had HOB elevated . Sat EOB for about 5 minutees, Initially with some great dizziness howevr improved about small exercises with LEs (5x s bilaterally LAQ with  rest breaks in between).   Transfers Overall transfer level: Needs assistance Equipment used: Rolling walker (2 wheeled) Transfers: Sit to/from Stand Sit to Stand: Min assist         General transfer comment: assit to rise and steady with balance , and took 3-4 steps to pivot to the chair. Felt strong on feet, howevr once seated pt's SOB was very evident and coached him through catching his breath again.   Ambulation/Gait                Stairs            Wheelchair  Mobility    Modified Rankin (Stroke Patients Only)       Balance                                             Pertinent Vitals/Pain Pain Assessment: No/denies pain    Home Living Family/patient expects to be discharged to:: Skilled nursing facility Living Arrangements: Spouse/significant other Available Help at Discharge: Family Type of Home: House Home Access: Stairs to enter Entrance Stairs-Rails: Right Entrance Stairs-Number of Steps: 4 Home Layout: One level Home Equipment: Walker - 2 wheels Additional Comments: Pt lives at home with his wife and he is the caregiver for she has dementia     Prior Function Level of Independence: Independent         Comments: he was independent until about 3-4 weeks ago. Just received )2 in his home last friday 09/12/2016. Has been in recliner for last 3 weeks mostly. very weak due to flu and reactions to chemo and radiation      Hand Dominance        Extremity/Trunk Assessment        Lower Extremity Assessment Lower Extremity Assessment: Generalized weakness (generalized weakness throughout, hower very little tasks exacerbates his SOB, however with coaching he was able to get things calmed down with breathing after about 1  minute)       Communication   Communication: No difficulties  Cognition Arousal/Alertness: Awake/alert Behavior During Therapy: WFL for tasks assessed/performed Overall Cognitive Status: Within Functional Limits for tasks assessed                      General Comments      Exercises Other Exercises Other Exercises: supine B LE exerises ( heel slides 5 x each, and seated LAQ bil 5x each) all with rest breaks and slow due to SOB with little activity.    Assessment/Plan    PT Assessment Patient needs continued PT services  PT Problem List Decreased strength;Decreased activity tolerance;Decreased balance;Decreased mobility;Decreased safety awareness       PT Treatment  Interventions DME instruction;Gait training;Functional mobility training;Therapeutic activities;Therapeutic exercise    PT Goals (Current goals can be found in the Care Plan section)  Acute Rehab PT Goals Patient Stated Goal: Iw ant to be able to make it back home eventually PT Goal Formulation: With patient Time For Goal Achievement: 09/29/16 Potential to Achieve Goals: Fair    Frequency Min 3X/week   Barriers to discharge   wife with dementia unable to give assistance to patient     Co-evaluation               End of Session Equipment Utilized During Treatment: Gait belt;Oxygen (4 L [place] extension on [place] ) Activity Tolerance: Patient tolerated treatment well Patient left: in chair;with call bell/phone within reach;with family/visitor present (no chair alarm present however educated pt and son to call nursing staff for any need of mobilization. ) Nurse Communication: Mobility status PT Visit Diagnosis: Muscle weakness (generalized) (M62.81)         Time: 0093-8182 PT Time Calculation (min) (ACUTE ONLY): 36 min   Charges:   PT Evaluation $PT Eval Moderate Complexity: 1 Procedure PT Treatments $Therapeutic Activity: 8-22 mins   PT G CodesClide Dales Sep 29, 2016, 5:09 PM Clide Dales, PT Pager: [phone] 2016/09/29

## 2016-09-16 ENCOUNTER — Ambulatory Visit: Payer: Medicare Other

## 2016-09-16 ENCOUNTER — Ambulatory Visit: Payer: Medicare Other | Admitting: Radiation Oncology

## 2016-09-16 LAB — CBC WITH DIFFERENTIAL/PLATELET
BASOS ABS: 0 10*3/uL (ref 0.0–0.1)
BASOS PCT: 0 %
EOS ABS: 0 10*3/uL (ref 0.0–0.7)
Eosinophils Relative: 1 %
HCT: 25.5 % — ABNORMAL LOW (ref 39.0–52.0)
Hemoglobin: 8.6 g/dL — ABNORMAL LOW (ref 13.0–17.0)
Lymphocytes Relative: 17 %
Lymphs Abs: 0.9 10*3/uL (ref 0.7–4.0)
MCH: 30.7 pg (ref 26.0–34.0)
MCHC: 33.7 g/dL (ref 30.0–36.0)
MCV: 91.1 fL (ref 78.0–100.0)
MONO ABS: 0.5 10*3/uL (ref 0.1–1.0)
MONOS PCT: 10 %
NEUTROS PCT: 72 %
Neutro Abs: 3.9 10*3/uL (ref 1.7–7.7)
Platelets: 133 10*3/uL — ABNORMAL LOW (ref 150–400)
RBC: 2.8 MIL/uL — ABNORMAL LOW (ref 4.22–5.81)
RDW: 16.5 % — AB (ref 11.5–15.5)
WBC: 5.3 10*3/uL (ref 4.0–10.5)

## 2016-09-16 LAB — BASIC METABOLIC PANEL
ANION GAP: 7 (ref 5–15)
BUN: 9 mg/dL (ref 6–20)
CHLORIDE: 106 mmol/L (ref 101–111)
CO2: 25 mmol/L (ref 22–32)
Calcium: 8.1 mg/dL — ABNORMAL LOW (ref 8.9–10.3)
Creatinine, Ser: 0.69 mg/dL (ref 0.61–1.24)
Glucose, Bld: 102 mg/dL — ABNORMAL HIGH (ref 65–99)
Potassium: 3.9 mmol/L (ref 3.5–5.1)
SODIUM: 138 mmol/L (ref 135–145)

## 2016-09-16 MED ORDER — HYDROCODONE-ACETAMINOPHEN 5-325 MG PO TABS: 4.0000 | ORAL_TABLET | Freq: Four times a day (QID) | ORAL | 0 refills | Status: AC | PRN

## 2016-09-16 MED ORDER — LEVOFLOXACIN 500 MG PO TABS: 500.0000 mg | ORAL_TABLET | Freq: Every day | ORAL | 0 refills | Status: AC

## 2016-09-16 MED ORDER — MORPHINE SULFATE ER 30 MG PO TBCR: 30.0000 mg | EXTENDED_RELEASE_TABLET | Freq: Two times a day (BID) | ORAL | 0 refills | Status: AC

## 2016-09-16 MED ORDER — OSELTAMIVIR PHOSPHATE 75 MG PO CAPS: 75.0000 mg | ORAL_CAPSULE | Freq: Two times a day (BID) | ORAL | 0 refills | Status: AC

## 2016-09-16 MED ORDER — OXYCODONE-ACETAMINOPHEN 5-325 MG PO TABS: 1.0000 | ORAL_TABLET | Freq: Four times a day (QID) | ORAL | 0 refills | Status: AC | PRN

## 2016-09-16 NOTE — NC FL2 (Signed)
[place] LEVEL OF CARE SCREENING TOOL     IDENTIFICATION  Patient Name: Donald Delacruz Birthdate: February 28, 1941 Sex: male Admission Date (Current Location): 09/12/2016  [facility] and [place] Number:  Herbalist and Address:  [facility],  [place] [address], [place]      Provider Number: [id number]  Attending Physician Name and Address:  Nita Sells, MD  Relative Name and Phone Number:       Current Level of Care: Hospital Recommended Level of Care: [place] Prior Approval Number:    Date Approved/Denied:   PASRR Number:   [id number]  Discharge Plan: SNF    Current Diagnoses: Patient Active Problem List   Diagnosis Date Noted  . Influenza B 09/12/2016  . Dehydration 08/27/2016  . Encounter for antineoplastic chemotherapy 07/31/2016  . Goals of care, counseling/discussion 07/31/2016  . 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 Malignant neoplasm of upper lobe of right lung ([place]) 12/24/2015  . Deficiency anemia 06/20/2015  . Chemotherapy induced neutropenia ([place]) 06/06/2015  . Lung cancer, right upper lobe 02/02/2015  . S/P lobectomy of lung 01/30/2015  . Pre-operative clearance 01/11/2015  . Hypertension   . Restless leg 12/20/2014  . Chronic obstructive pulmonary emphysema ([place]) 12/20/2014  . 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 Fatty tumor 12/20/2014  . 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 Encounter for screening for malignant neoplasm of prostate 12/20/2014  . Chronic otitis externa 12/20/2014  . Abnormal finding on radiology exam 12/19/2014  . Coronary artery calcification seen on computed tomography 11/29/2014    Orientation RESPIRATION BLADDER Height & Weight     Self, Place, Time, Situation  O2 (4L) Continent Weight: 152 lb 1.9 oz (69 kg) Height:  '5\' 10"'$  (177.8 cm)  BEHAVIORAL SYMPTOMS/MOOD NEUROLOGICAL BOWEL NUTRITION STATUS      Continent Diet (See DC summary)  AMBULATORY  STATUS COMMUNICATION OF NEEDS Skin   Limited Assist Verbally Normal                       Personal Care Assistance Level of Assistance  Bathing, Feeding, Dressing Bathing Assistance: Limited assistance Feeding assistance: Independent Dressing Assistance: Limited assistance     Functional Limitations Info  Sight, Hearing, Speech Sight Info: Adequate Hearing Info: Adequate Speech Info: Adequate    SPECIAL CARE FACTORS FREQUENCY  PT (By licensed PT), OT (By licensed OT)     PT Frequency: 5x OT Frequency: 5x            Contractures Contractures Info: Not present    Additional Factors Info  Code Status, Allergies, Isolation Precautions Code Status Info: Full Code Allergies Info: Amlodipine, Benicar Olmesartan, Nisoldipine     Isolation Precautions Info: Droplet precautions     Current Medications (09/16/2016):  This is the current hospital active medication list Current Facility-Administered Medications  Medication Dose Route Frequency Provider Last Rate Last Dose  . acetaminophen (TYLENOL) tablet 1,000 mg  1,000 mg Oral Q4H PRN Nita Sells, MD      . diltiazem (CARDIZEM CD) 24 hr capsule 120 mg  120 mg Oral q morning - 10a Nita Sells, MD   120 mg at 09/15/16 1034  . enoxaparin (LOVENOX) injection 30 mg  30 mg Subcutaneous Q24H Nita Sells, MD   30 mg at 09/15/16 2241  . feeding supplement (ENSURE ENLIVE) (ENSURE ENLIVE) liquid 237 mL  237 mL Oral BID BM Nita Sells, MD   237 mL at 09/15/16 1113  .  ferrous sulfate tablet 325 mg  325 mg Oral Daily Nita Sells, MD   325 mg at 09/15/16 1033  . gabapentin (NEURONTIN) capsule 600 mg  600 mg Oral QHS Nita Sells, MD   600 mg at 09/15/16 2240  . HYDROcodone-homatropine (HYCODAN) 5-1.5 MG/5ML syrup 5 mL  5 mL Oral Q6H PRN Nita Sells, MD   5 mL at 09/15/16 0053  . levofloxacin (LEVAQUIN) tablet 500 mg  500 mg Oral Daily Nita Sells, MD   500 mg at 09/15/16 1030   . meloxicam (MOBIC) tablet 15 mg  15 mg Oral Daily Nita Sells, MD   15 mg at 09/15/16 1034  . metoprolol (LOPRESSOR) injection 2.5 mg  2.5 mg Intravenous Q6H PRN Nita Sells, MD   2.5 mg at 09/14/16 0426  . metoprolol succinate (TOPROL-XL) 24 hr tablet 150 mg  150 mg Oral q morning - 10a Nita Sells, MD   150 mg at 09/15/16 1029  . mirtazapine (REMERON) tablet 30 mg  30 mg Oral QHS Nita Sells, MD   30 mg at 09/15/16 2239  . morphine (MS CONTIN) 12 hr tablet 30 mg  30 mg Oral Q12H Nita Sells, MD   30 mg at 09/15/16 2240  . ondansetron (ZOFRAN) injection 8 mg  8 mg Intravenous Once Nita Sells, MD   Stopped at 09/12/16 1915  . oseltamivir (TAMIFLU) capsule 75 mg  75 mg Oral BID Nita Sells, MD   75 mg at 09/15/16 2239  . oxyCODONE-acetaminophen (PERCOCET/ROXICET) 5-325 MG per tablet 1 tablet  1 tablet Oral Q6H PRN Nita Sells, MD      . sodium chloride 0.9 % bolus 1,000 mL  1,000 mL Intravenous Once Nita Sells, MD   Stopped at 09/12/16 1915   And  . sodium chloride 0.9 % bolus 1,000 mL  1,000 mL Intravenous Once Nita Sells, MD   Stopped at 09/12/16 1915   And  . sodium chloride 0.9 % bolus 250 mL  250 mL Intravenous Once Nita Sells, MD   Stopped at 09/12/16 1945     Discharge Medications: Please see discharge summary for a list of discharge medications.  Relevant Imaging Results:  Relevant Lab Results:   Additional Information SSN: [id number]  Palliative to follow at the facility  [place], Evie Lacks, LCSW

## 2016-09-16 NOTE — Clinical Social Work Placement (Addendum)
   CLINICAL SOCIAL WORK PLACEMENT  NOTE  Date:  09/16/2016  Patient Details  Name: Donald Delacruz MRN: [id number] Date of Birth: [birth year]  Clinical Social Work is seeking post-discharge placement for this patient at the [place] level of care (*CSW will initial, date and re-position this form in  chart as items are completed):  Yes   Patient/family provided with [facility] Work Department's list of facilities offering this level of care within the geographic area requested by the patient (or if unable, by the patient's family).  Yes   Patient/family informed of their freedom to choose among providers that offer the needed level of care, that participate in Medicare, Medicaid or managed care program needed by the patient, have an available bed and are willing to accept the patient.  Yes   Patient/family informed of [facility]'s ownership interest in [facility] and [facility], as well as of the fact that they are under no obligation to receive care at these facilities.  PASRR submitted to EDS on 09/16/16     PASRR number received on 09/16/16     Existing PASRR number confirmed on       FL2 transmitted to all facilities in geographic area requested by pt/family on 09/16/16     FL2 transmitted to all facilities within larger geographic area on       Patient informed that his/her managed care company has contracts with or will negotiate with certain facilities, including the following:            Patient/family informed of bed offers received.  09/16/2016   Patient chooses bed at     [facility]  Physician recommends and patient chooses bed at     SNF Patient to be transferred to   on  .  09/16/2016   Patient to be transferred to facility by      EMS  Patient family notified on   of transfer.  Son [name]   Name of family member notified:        PHYSICIAN Please sign FL2     Additional Comment:     _______________________________________________ Lilly Cove, LCSW 09/16/2016, 11:08 AM

## 2016-09-16 NOTE — Discharge Summary (Addendum)
Physician Discharge Summary  Delwyn Scoggin WER:[id number] DOB: Oct 03, 1940 DOA: 09/12/2016  PCP: [name], MD  Admit date: 09/12/2016 Discharge date: 09/16/2016  Time spent: 40 minutes  Recommendations for Outpatient Follow-up:  1. Complete elvaquin after 2 doses as well as tamiflu [2 more doses] 2. Needs Follow up with oncology as OP 3. Needs cbc and bmet 1 week at [facility] 4. Will need therapy at SNF 5. recommend home oxygen until hypoxia from PNa resolves 6. 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 Htn Anemia malignancy Troponin  Admitted with sepsis and likely Post-obst pna  Hospital Course:  1. sepsis secondary to H influenza B with possible superimposed bacterial infection-wife recently diagnosed-Lactic acid 3.5-->1.7 and improved Treat with Tamiflu. Transitioned to by mouth Levaquin for broad-spectrum antibiotic. holding anti-htn. Saline lock IV from 125 cc per hour-sepsis physiology resolved. Needs CXr in 4 wks 2. Hypotension initally 2/2 to sepsis-is on Cardizem 180, lisinopril 20, metoprolol 150-these were initially held-Metoprolol and Cardizem was resumed-titrate as OP 3. metastatic non-small cell lung cancer-continue XRT, will need oncology input as an outpatient regarding goals of care- At this time patient would like to be a full code 4. Anemia of malignancy, mild pancytopenia-continue iron supplement, hold vitamin C-hemoglobin is stable in the chief complaint history of 149--repeat CBC a.m. 5. elevated troponin-patient has no chest pain whatsoever EKG looks relatively benign. Trending downwards from 0.7-->0.4 6. Severe  malnutrition-weight loss secondary to cancer-hold trazodone, resumed Remeron on d/c 7. Pain secondary to cancer-continue MS Contin 30 mg, meloxicam 50, Percocet 1 tablet every 6 when necessary    Discharge Exam: Vitals:   09/15/16 2105 09/16/16 0537  BP: 113/63 113/73  Pulse: (!) 106 (!) 107  Resp: 20 20  Temp: 97.8 F (36.6 C) 99.3 F (37.4 C)    General: alert a little tired  Cardiovascular: s1 s 2 no m/r/g Respiratory:  Clear no added sound  Discharge Instructions    Current Discharge Medication List    START taking these medications   Details  levofloxacin (LEVAQUIN) 500 MG tablet Take 1 tablet (500 mg total) by mouth daily. Qty: 2 tablet, Refills: 0    oseltamivir (TAMIFLU) 75 MG capsule Take 1 capsule (75 mg total) by mouth 2 (two) times daily. Qty: 2 capsule, Refills: 0      CONTINUE these medications which have CHANGED   Details  HYDROcodone-acetaminophen (NORCO/VICODIN) 5-325 MG tablet Take 4 tablets by mouth every 6 (six) hours as needed for moderate pain. Qty: 4 tablet, Refills: 0    morphine (MS CONTIN) 30 MG 12 hr tablet Take 1 tablet (30 mg total) by mouth every 12 (twelve) hours. Qty: 4 tablet, Refills: 0    oxyCODONE-acetaminophen (PERCOCET/ROXICET) 5-325 MG tablet Take 1 tablet by mouth every 6 (six) hours as needed for severe pain. Qty: 4 tablet, Refills: 0      CONTINUE these medications which have NOT CHANGED   Details  acetaminophen (TYLENOL) 500 MG tablet Take 1,000 mg by mouth every 4 (four) hours as needed for moderate pain or headache.     albuterol (PROVENTIL HFA;VENTOLIN HFA) 108 (90 Base) MCG/ACT inhaler Inhale 1-2 puffs into the  lungs every 6 (six) hours as needed for wheezing or shortness of breath. Qty: 1 Inhaler, Refills: 2   Associated Diagnoses: Malignant neoplasm of upper lobe of right lung (HCC)    benzonatate (TESSALON) 100 MG capsule Take 1 capsule (100 mg total) by mouth 3 (three) times daily as needed for cough. Qty: 30  capsule, Refills: 2    ferrous sulfate 325 (65 FE) MG EC tablet Take 325 mg by mouth daily.    folic acid (FOLVITE) 1 MG tablet Take 1 tablet (1 mg total) by mouth daily. Qty: 30 tablet, Refills: 2   Associated Diagnoses: Malignant neoplasm of upper lobe of lung, unspecified laterality (HCC)    gabapentin (NEURONTIN) 300 MG capsule Take 600 mg by mouth at bedtime.     HYDROcodone-homatropine (HYCODAN) 5-1.5 MG/5ML syrup Take 5 mLs by mouth every 6 (six) hours as needed for cough. Qty: 240 mL, Refills: 0   Associated Diagnoses: Malignant neoplasm of upper lobe of right lung ([place]); Malignant neoplasm of upper lobe of lung, unspecified laterality ([place]); Dehydration    meloxicam (MOBIC) 15 MG tablet Take 15 mg by mouth daily.    metoprolol succinate (TOPROL-XL) 100 MG 24 hr tablet Take 150 mg by mouth every morning.     mirtazapine (REMERON) 30 MG tablet Take 1 tablet (30 mg total) by mouth at bedtime. Qty: 30 tablet, Refills: 2    Multiple Vitamin (MULTIVITAMIN) capsule Take 1 capsule by mouth every morning.     prochlorperazine (COMPAZINE) 10 MG tablet TAKE 1 TABLET BY MOUTH EVERY 6 HOURS AS NEEDED FOR NAUSEA OR VOMITING Qty: 385 tablet, Refills: 0    Turmeric 500 MG CAPS Take 1 capsule by mouth daily.    vitamin C (ASCORBIC ACID) 500 MG tablet Take 500 mg by mouth daily.    emollient (BIAFINE) cream Apply topically as needed. Qty: 454 g, Refills: 0      STOP taking these medications     dexamethasone (DECADRON) 4 MG tablet      diltiazem (CARDIZEM CD) 180 MG 24 hr capsule      lisinopril (PRINIVIL,ZESTRIL) 20 MG tablet      Omega-3 Fatty Acids (FISH OIL) 1000 MG CAPS      traZODone (DESYREL) 100 MG tablet        Allergies  Allergen Reactions  . Amlodipine Swelling  . Benicar [Olmesartan]     Dizziness,malaise   . Nisoldipine Swelling   Follow-up Information    [name], MD.   Specialty:  Family Medicine Contact information: [address]  [place] [address]            The results of significant diagnostics from this hospitalization (including imaging, microbiology, ancillary and laboratory) are listed below for reference.    Significant Diagnostic Studies: Dg Chest 2 View  Result Date: 09/15/2016 CLINICAL DATA:  Pneumonia. EXAM: CHEST  2 VIEW COMPARISON:  Radiographs and CT scan of September 12, 2016. FINDINGS: Stable cardiomediastinal silhouette. No pneumothorax is noted. Mildly increased right posterior basilar opacity is noted suggesting atelectasis or infiltrate. Left lung is unremarkable. Small associated pleural effusion cannot be excluded. Bony thorax is unremarkable. IMPRESSION: Slightly increased right basilar opacity is noted concerning for atelectasis or infiltrate. Electronically Signed   By: Marijo Conception, M.D.   On: 09/15/2016 10:08   Dg Chest 2 View  Result Date: 09/12/2016 CLINICAL DATA:  Shortness of breath. EXAM: CHEST  2 VIEW COMPARISON:  07/08/2016. FINDINGS: Heart size normal. Postsurgical changes right  chest. Mild infiltrate right lung base cannot be excluded. Mild bilateral pleural thickening again noted most likely scarring. IMPRESSION: 1. Postsurgical changes right chest. 2.  Mild right base infiltrate cannot be excluded . Electronically Signed   By: Marcello Moores  Register   On: 09/12/2016 15:55   Ct Angio Chest Pe W And/or Wo Contrast  Result Date: 09/12/2016 CLINICAL DATA:  Lung cancer. Tachycardia. Hypotension. Right upper lobectomy. EXAM: CT ANGIOGRAPHY CHEST WITH CONTRAST TECHNIQUE: Multidetector CT imaging of the chest was performed using the standard protocol during bolus administration of intravenous contrast. Multiplanar CT image reconstructions and MIPs were obtained to evaluate the vascular anatomy. CONTRAST:  100 cc Isovue 370 COMPARISON:  09/12/2016 FINDINGS: Cardiovascular: No filling defect is identified in the pulmonary arterial tree to suggest pulmonary embolus. Coronary, aortic arch, and  branch vessel atherosclerotic vascular disease. Mediastinum/Nodes: Right paratracheal tumor, with increasing soft tissue density surrounding the right mainstem bronchus. Subcarinal tumor noted. An index prevascular node measures 1.0 cm in short axis on image 20/5 and was previously hypermetabolic as well. Lungs/Pleura: There has been interval complete atelectasis of the right middle lobe. The right upper lobe is surgically absent. Truncated right middle lobe bronchus likely due to tumor. Narrowed right lower lobe central tracheobronchial tree due to tumor, possibly invaded by tumor. Reticulonodular ground-glass opacities in the right lower lobe per most confluent posteriorly and inferiorly, and compatible with infection. There is lesser degree of reticulonodular opacity in the left lower lobe along with some consolidation in the posterior basal segment left lower lobe compatible with pneumonia. New small right pleural effusion. Bilateral airway thickening is noted.  Emphysema is present. Upper Abdomen: Cystic lesion of the left kidney upper pole. Mild fullness of the left adrenal gland. Musculoskeletal: Scattered makes bony densities suspicious for relatively diffuse osseous metastatic disease. Review of the MIP images confirms the above findings. IMPRESSION: 1. No filling defect is identified in the pulmonary arterial tree to suggest pulmonary embolus. 2. Airspace opacities along with reticulonodular and ground-glass opacities especially in the right lower lobe, and also with some consolidation in the posterior basal segment left lower lobe, compatible with multilobar pneumonia or aspiration pneumonitis. There is also airway thickening. 3. Interval complete collapse of the right middle lobe, probably due to obstructing tumor. There is prominent narrowing of the right lower lobe tracheobronchial tree due to tumor. The right upper lobe is surgically absent. 4. Emphysema. 5. Mild fullness of the left adrenal gland,  cannot exclude early metastatic disease. 6. Coronary, aortic arch, and branch vessel atherosclerotic vascular disease. 7. Tumor wraps around the right mainstem bronchus and there are scattered malignant mediastinal lymph nodes. 8. Scattered osseous metastatic disease. 9. New small right pleural effusion. Electronically Signed   By: Van Clines M.D.   On: 09/12/2016 18:40    Microbiology: Recent Results (from the past 240 hour(s))  Blood culture (routine x 2)     Status: None (Preliminary result)   Collection Time: 09/12/16  2:53 PM  Result Value Ref Range Status   Specimen Description BLOOD LEFT ANTECUBITAL  Final   Special Requests BOTTLES DRAWN AEROBIC AND ANAEROBIC 5CC  Final   Culture   Final    NO GROWTH 3 DAYS Performed at [facility], [place] [address] [place], [place] [address]    Report Status PENDING  Incomplete  Blood culture (routine x 2)     Status: None (Preliminary result)   Collection Time: 09/12/16  3:15 PM  Result Value Ref Range Status  Specimen Description BLOOD LEFT HAND  Final   Special Requests IN PEDIATRIC BOTTLE 2CC  Final   Culture   Final    NO GROWTH 3 DAYS Performed at [facility], [place] [address], [place], [place] [address]    Report Status PENDING  Incomplete  Culture, blood (x 2)     Status: None (Preliminary result)   Collection Time: 09/12/16  8:05 PM  Result Value Ref Range Status   Specimen Description BLOOD LEFT ANTECUBITAL  Final   Special Requests IN PEDIATRIC BOTTLE 1 CC  Final   Culture   Final    NO GROWTH 2 DAYS Performed at [facility], [place] [address], [place], [place] [address]    Report Status PENDING  Incomplete  Culture, blood (x 2)     Status: None (Preliminary result)   Collection Time: 09/12/16  9:09 PM  Result Value Ref Range Status   Specimen Description BLOOD LEFT ANTECUBITAL  Final   Special Requests BOTTLES DRAWN AEROBIC AND ANAEROBIC 10CC  Final   Culture   Final    NO GROWTH 2 DAYS Performed  at [facility], [place] [address] [place], [place] [address]    Report Status PENDING  Incomplete  MRSA PCR Screening     Status: None   Collection Time: 09/12/16 10:14 PM  Result Value Ref Range Status   MRSA by PCR NEGATIVE NEGATIVE Final    Comment:        The GeneXpert MRSA Assay (FDA approved for NASAL specimens only), is one component of a comprehensive MRSA colonization surveillance program. It is not intended to diagnose MRSA infection nor to guide or monitor treatment for MRSA infections.   Urine culture     Status: Abnormal   Collection Time: 09/13/16  2:00 AM  Result Value Ref Range Status   Specimen Description URINE, CLEAN CATCH  Final   Special Requests NONE  Final   Culture (A)  Final    <10,000 COLONIES/mL INSIGNIFICANT GROWTH Performed at [facility], [address], [place], [place] [address]    Report Status 09/14/2016 FINAL  Final     Labs: Basic Metabolic Panel:  Recent Labs Lab 09/09/16 1511 09/12/16 1452 09/12/16 2018 09/14/16 0830 09/16/16 0659  NA 129* 134* 131* 136 138  K 4.2 4.8 4.2 4.3 3.9  CL  --  103 104 110 106  CO2 19* 22 20* 17* 25  GLUCOSE 127 124* 184* 95 102*  BUN 27.4* 22* '20 12 9  '$ CREATININE 1.4* 1.16 0.96 0.64 0.69  CALCIUM 8.8 8.3* 7.1* 8.0* 8.1*   Liver Function Tests:  Recent Labs Lab 09/09/16 1511 09/12/16 1452 09/12/16 2018  AST 51* 42* 36  ALT 33 20 19  ALKPHOS 85 61 61  BILITOT 0.65 1.0 0.8  PROT 6.2* 5.7* 4.2*  ALBUMIN 2.5* 2.3* 2.0*   No results for input(s): LIPASE, AMYLASE in the last 168 hours. No results for input(s): AMMONIA in the last 168 hours. CBC:  Recent Labs Lab 09/09/16 1511 09/12/16 1452 09/12/16 2018 09/13/16 0738 09/16/16 0659  WBC 6.3 3.4* 2.9* 3.6* 5.3  NEUTROABS 5.1 2.9  --  2.9 3.9  HGB 9.6* 9.6* 8.0* 8.6* 8.6*  HCT 28.9* 29.0* 23.4* 25.5* 25.5*  MCV 90.1 88.1 89.0 89.2 91.1  PLT 171 149* 120* 105* 133*   Cardiac Enzymes:  Recent Labs Lab 09/12/16 1452  09/12/16 2102 09/13/16 0038 09/13/16 0735  TROPONINI 0.73* 0.50* 0.47* 0.37*  BNP: BNP (last 3 results) No results for input(s): BNP in the last 8760 hours.  ProBNP (last 3 results) No results for input(s): PROBNP in the last 8760 hours.  CBG: No results for input(s): GLUCAP in the last 168 hours.     SignedNita Sells MD   [facility] 09/16/2016, 9:11 AM

## 2016-09-16 NOTE — Progress Notes (Signed)
Pt place to discharge to SNF/CSW following.

## 2016-09-16 NOTE — Care Management Important Message (Signed)
Important Message  Patient Details IM Letter given to Cookie/Case Manager to present to Patient Name: Donald Delacruz MRN: [id number] Date of Birth: 07-11-1940   Medicare Important Message Given:  Yes    Kerin Salen 09/16/2016, 10:24 AMImportant Message  Patient Details  Name: Donald Delacruz MRN: [id number] Date of Birth: [birth year]   Medicare Important Message Given:  Yes    Kerin Salen 09/16/2016, 10:24 AM

## 2016-09-16 NOTE — Progress Notes (Signed)
LCSW following for disposition: SNF and accepted to [facility] for admission. LCSW sent all paperwork via Hub and confirmed receipt.  Patient will transport by EMS. LCSW has contacted son and he is appreciative and in agreement. Son will sign patient into hospital.  No other needs at this time. DC to SNF.  Lane Hacker, MSW Clinical Social Work: [employer] Coverage for :  [phone]

## 2016-09-16 NOTE — Clinical Social Work Note (Signed)
Clinical Social Work Assessment  Patient Details  Name: Donald Delacruz MRN: [id number] Date of Birth: 1940/09/26  Date of referral:  09/16/16               Reason for consult:  Facility Placement, Discharge Planning, Family Concerns                Permission sought to share information with:  Case Manager, Family Supports, Customer service manager Permission granted to share information::  Yes, Verbal Permission Granted  Name::        Agency::  [employer]  Relationship::  Son  Sport and exercise psychologist Information:     Housing/Transportation Living arrangements for the past 2 months:  [place] of Information:  Patient, Medical Team, Case Manager, Adult Children Patient Interpreter Needed:  None Criminal Activity/Legal Involvement Pertinent to Current Situation/Hospitalization:  No - Comment as needed Significant Relationships:  Adult Children, Other Family Members Lives with:  Adult Children Do you feel safe going back to the place where you live?  No Need for family participation in patient care:  Yes (Comment)  Care giving concerns:  Patient admitted from home per son and patient and due to prognosis and treatments, patient very weak and wanting to short term rehab prior to return home. Son available for assistance, however unable to provide care at this time as desired.  Open to referrals for SNF and request [place].   Social Worker assessment / plan:  LCSW completed consult. SNF work up completed. Patient is a planned discharge today. Awaiting review of referrals and will follow up with son.  Employment status:  Retired Nurse, adult PT Recommendations:  [place] / Referral to community resources:  [place]  Patient/Family's Response to care:  Agreeable to plan  Patient/Family's Understanding of and Emotional Response to Diagnosis, Current Treatment, and Prognosis:  Family  understanding of recommendation and limitations with returning home and agreeable to SNF work up.  Emotional Assessment Appearance:  Appears stated age Attitude/Demeanor/Rapport:    Affect (typically observed):  Accepting, Adaptable, Pleasant Orientation:  Oriented to Self, Oriented to Place, Oriented to  Time, Oriented to Situation Alcohol / Substance use:  Not Applicable Psych involvement (Current and /or in the community):  No (Comment)  Discharge Needs  Concerns to be addressed:  No discharge needs identified Readmission within the last 30 days:  No Current discharge risk:  None Barriers to Discharge:  No Barriers Identified   [place], LCSW 09/16/2016, 11:05 AM

## 2016-09-16 NOTE — Progress Notes (Signed)
Discharge report called to [facility] at [facility]. Pt transferred via [facility]. Condition unchanged. Eulas Post, RN

## 2016-09-17 ENCOUNTER — Encounter: Payer: Self-pay | Admitting: Internal Medicine

## 2016-09-17 ENCOUNTER — Other Ambulatory Visit: Payer: Medicare Other

## 2016-09-17 ENCOUNTER — Ambulatory Visit: Payer: Medicare Other

## 2016-09-17 LAB — CULTURE, BLOOD (ROUTINE X 2)
CULTURE: NO GROWTH
Culture: NO GROWTH

## 2016-09-18 ENCOUNTER — Encounter: Payer: Medicare Other | Admitting: Nutrition

## 2016-09-18 ENCOUNTER — Ambulatory Visit: Payer: Medicare Other | Admitting: Internal Medicine

## 2016-09-18 ENCOUNTER — Other Ambulatory Visit: Payer: Medicare Other

## 2016-09-18 ENCOUNTER — Ambulatory Visit: Payer: Medicare Other

## 2016-09-18 LAB — CULTURE, BLOOD (ROUTINE X 2)
CULTURE: NO GROWTH
Culture: NO GROWTH

## 2016-09-19 ENCOUNTER — Ambulatory Visit: Payer: Medicare Other

## 2016-09-22 ENCOUNTER — Ambulatory Visit: Payer: Medicare Other

## 2016-09-23 ENCOUNTER — Ambulatory Visit: Payer: Medicare Other

## 2016-09-24 ENCOUNTER — Ambulatory Visit: Payer: Medicare Other

## 2016-09-24 ENCOUNTER — Ambulatory Visit: Payer: Medicare Other | Admitting: Internal Medicine

## 2016-09-24 ENCOUNTER — Other Ambulatory Visit: Payer: Medicare Other

## 2016-09-25 ENCOUNTER — Ambulatory Visit: Payer: Medicare Other

## 2016-09-25 ENCOUNTER — Other Ambulatory Visit: Payer: Medicare Other

## 2016-09-26 ENCOUNTER — Ambulatory Visit: Payer: Medicare Other

## 2016-09-29 ENCOUNTER — Ambulatory Visit: Payer: Medicare Other

## 2016-09-30 ENCOUNTER — Ambulatory Visit: Payer: Medicare Other

## 2016-10-01 ENCOUNTER — Telehealth: Payer: Self-pay | Admitting: Medical Oncology

## 2016-10-01 ENCOUNTER — Other Ambulatory Visit: Payer: Self-pay | Admitting: Medical Oncology

## 2016-10-01 ENCOUNTER — Ambulatory Visit: Payer: Medicare Other

## 2016-10-01 NOTE — Telephone Encounter (Signed)
Spoke to son.He was on another line and I asked him to  call me back with an update on his dad.

## 2016-10-02 ENCOUNTER — Ambulatory Visit: Payer: Medicare Other

## 2016-10-02 ENCOUNTER — Other Ambulatory Visit: Payer: Medicare Other

## 2016-10-09 ENCOUNTER — Telehealth: Payer: Self-pay | Admitting: Medical Oncology

## 2016-10-09 ENCOUNTER — Ambulatory Visit: Payer: Medicare Other

## 2016-10-09 ENCOUNTER — Ambulatory Visit: Payer: Medicare Other | Admitting: Internal Medicine

## 2016-10-09 ENCOUNTER — Other Ambulatory Visit: Payer: Medicare Other

## 2016-10-09 IMAGING — NM NM MISC PROCEDURE
6 series · 36 of 36 positions shown · non-contrast
Comparison: none

[Series 1: wbr rest · 6.40mm/px · 6 of 64 frames shown]
[frame 6/64]
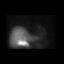
[frame 16/64]
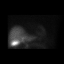
[frame 27/64]
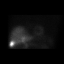
[frame 38/64]
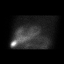
[frame 48/64]
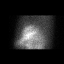
[frame 59/64]
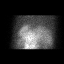

[Series 1: wbr_r-proj_st wbr rest · 6.40mm/px · 6 of 64 frames shown]
[frame 6/64]
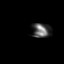
[frame 16/64]
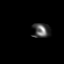
[frame 27/64]
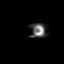
[frame 38/64]
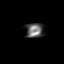
[frame 48/64]
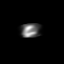
[frame 59/64]
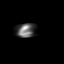

[Series 2: wbr_s-proj_st wbr stress-gsp · 6.40mm/px · 6 of 512 frames shown]
[frame 43/512]
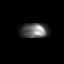
[frame 128/512]
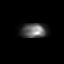
[frame 214/512]
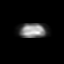
[frame 299/512]
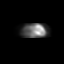
[frame 384/512]
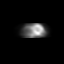
[frame 470/512]
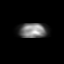

[Series 2: wbr stress-gsp · 6.40mm/px · 6 of 512 frames shown]
[frame 43/512]
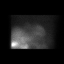
[frame 128/512]
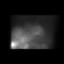
[frame 214/512]
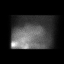
[frame 299/512]
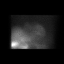
[frame 384/512]
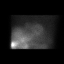
[frame 470/512]
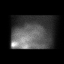

[Series 3: wbr stress-sum-em · 6.40mm/px · 6 of 64 frames shown]
[frame 6/64]
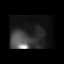
[frame 16/64]
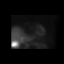
[frame 27/64]
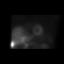
[frame 38/64]
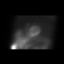
[frame 48/64]
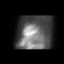
[frame 59/64]
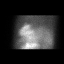

[Series 3: wbr_s-proj_st wbr stress-sum-em · 6.40mm/px · 6 of 64 frames shown]
[frame 6/64]
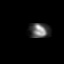
[frame 16/64]
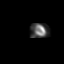
[frame 27/64]
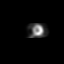
[frame 38/64]
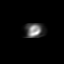
[frame 48/64]
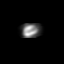
[frame 59/64]
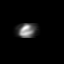

[36 of 36 positions shown; findings below may reference images not displayed]

Canned report from images found in remote index.

Refer to host system for actual result text.

## 2016-10-09 NOTE — Telephone Encounter (Signed)
Pt on hospice

## 2016-10-16 ENCOUNTER — Other Ambulatory Visit: Payer: Medicare Other

## 2016-10-23 ENCOUNTER — Other Ambulatory Visit: Payer: Medicare Other

## 2016-10-30 ENCOUNTER — Ambulatory Visit: Payer: Medicare Other

## 2016-10-30 ENCOUNTER — Other Ambulatory Visit: Payer: Medicare Other

## 2016-10-30 ENCOUNTER — Ambulatory Visit: Payer: Medicare Other | Admitting: Internal Medicine

## 2016-12-28 DEATH — deceased
